# Patient Record
Sex: Male | Born: 1990 | Hispanic: No | Marital: Single | Smoking: Former smoker
Health system: Southern US, Community
[De-identification: ages and names within clinical notes are randomized; demographics above are authoritative.]

## PROBLEM LIST (undated history)

## (undated) DIAGNOSIS — I10 Essential (primary) hypertension: Secondary | ICD-10-CM

## (undated) DIAGNOSIS — I2510 Atherosclerotic heart disease of native coronary artery without angina pectoris: Secondary | ICD-10-CM

## (undated) DIAGNOSIS — I219 Acute myocardial infarction, unspecified: Secondary | ICD-10-CM

## (undated) DIAGNOSIS — J45909 Unspecified asthma, uncomplicated: Secondary | ICD-10-CM

## (undated) DIAGNOSIS — R748 Abnormal levels of other serum enzymes: Secondary | ICD-10-CM

## (undated) DIAGNOSIS — F988 Other specified behavioral and emotional disorders with onset usually occurring in childhood and adolescence: Secondary | ICD-10-CM

## (undated) DIAGNOSIS — R739 Hyperglycemia, unspecified: Secondary | ICD-10-CM

## (undated) DIAGNOSIS — K8020 Calculus of gallbladder without cholecystitis without obstruction: Secondary | ICD-10-CM

## (undated) DIAGNOSIS — Z973 Presence of spectacles and contact lenses: Secondary | ICD-10-CM

## (undated) DIAGNOSIS — F419 Anxiety disorder, unspecified: Secondary | ICD-10-CM

## (undated) DIAGNOSIS — K219 Gastro-esophageal reflux disease without esophagitis: Secondary | ICD-10-CM

## (undated) DIAGNOSIS — I255 Ischemic cardiomyopathy: Secondary | ICD-10-CM

## (undated) DIAGNOSIS — M109 Gout, unspecified: Secondary | ICD-10-CM

## (undated) HISTORY — PX: APPENDECTOMY: SHX54

---
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
## 2014-04-28 ENCOUNTER — Emergency Department (HOSPITAL_COMMUNITY): Payer: Self-pay

## 2014-04-28 ENCOUNTER — Encounter (HOSPITAL_COMMUNITY): Payer: Self-pay | Admitting: Emergency Medicine

## 2014-04-28 ENCOUNTER — Encounter (HOSPITAL_COMMUNITY)
Admission: EM | Disposition: A | Discharge status: Home / Self Care | Payer: Self-pay | Source: Home / Self Care | Attending: Cardiovascular Disease

## 2014-04-28 ENCOUNTER — Inpatient Hospital Stay (HOSPITAL_COMMUNITY)
Admission: EM | Admit: 2014-04-28 | Discharge: 2014-05-01 | DRG: 247 | Disposition: A | Discharge status: Home / Self Care | Payer: Self-pay | Attending: Cardiovascular Disease | Admitting: Cardiovascular Disease

## 2014-04-28 DIAGNOSIS — I2510 Atherosclerotic heart disease of native coronary artery without angina pectoris: Secondary | ICD-10-CM | POA: Diagnosis present

## 2014-04-28 DIAGNOSIS — I2102 ST elevation (STEMI) myocardial infarction involving left anterior descending coronary artery: Secondary | ICD-10-CM

## 2014-04-28 DIAGNOSIS — F909 Attention-deficit hyperactivity disorder, unspecified type: Secondary | ICD-10-CM | POA: Diagnosis present

## 2014-04-28 DIAGNOSIS — Z8249 Family history of ischemic heart disease and other diseases of the circulatory system: Secondary | ICD-10-CM

## 2014-04-28 DIAGNOSIS — I2109 ST elevation (STEMI) myocardial infarction involving other coronary artery of anterior wall: Principal | ICD-10-CM

## 2014-04-28 DIAGNOSIS — Z79899 Other long term (current) drug therapy: Secondary | ICD-10-CM

## 2014-04-28 DIAGNOSIS — Z9861 Coronary angioplasty status: Secondary | ICD-10-CM

## 2014-04-28 DIAGNOSIS — I255 Ischemic cardiomyopathy: Secondary | ICD-10-CM | POA: Diagnosis present

## 2014-04-28 DIAGNOSIS — F1290 Cannabis use, unspecified, uncomplicated: Secondary | ICD-10-CM | POA: Diagnosis present

## 2014-04-28 DIAGNOSIS — R748 Abnormal levels of other serum enzymes: Secondary | ICD-10-CM | POA: Diagnosis present

## 2014-04-28 DIAGNOSIS — R739 Hyperglycemia, unspecified: Secondary | ICD-10-CM | POA: Diagnosis present

## 2014-04-28 DIAGNOSIS — I213 ST elevation (STEMI) myocardial infarction of unspecified site: Secondary | ICD-10-CM

## 2014-04-28 HISTORY — DX: Hyperglycemia, unspecified: R73.9

## 2014-04-28 HISTORY — DX: Other specified behavioral and emotional disorders with onset usually occurring in childhood and adolescence: F98.8

## 2014-04-28 HISTORY — PX: LEFT HEART CATH: SHX5478

## 2014-04-28 HISTORY — DX: Ischemic cardiomyopathy: I25.5

## 2014-04-28 HISTORY — DX: Abnormal levels of other serum enzymes: R74.8

## 2014-04-28 HISTORY — DX: Atherosclerotic heart disease of native coronary artery without angina pectoris: I25.10

## 2014-04-28 LAB — CBC
HCT: 47.8 % (ref 39.0–52.0)
Hemoglobin: 17 g/dL (ref 13.0–17.0)
MCH: 29.8 pg (ref 26.0–34.0)
MCHC: 35.6 g/dL (ref 30.0–36.0)
MCV: 83.9 fL (ref 78.0–100.0)
Platelets: 336 10*3/uL (ref 150–400)
RBC: 5.7 MIL/uL (ref 4.22–5.81)
RDW: 12.7 % (ref 11.5–15.5)
WBC: 15.4 10*3/uL — ABNORMAL HIGH (ref 4.0–10.5)

## 2014-04-28 LAB — PROTIME-INR
INR: 0.97 (ref 0.00–1.49)
Prothrombin Time: 13 seconds (ref 11.6–15.2)

## 2014-04-28 LAB — I-STAT TROPONIN, ED: TROPONIN I, POC: 1.06 ng/mL — AB (ref 0.00–0.08)

## 2014-04-28 LAB — COMPREHENSIVE METABOLIC PANEL
ALT: 36 U/L (ref 0–53)
ANION GAP: 13 (ref 5–15)
AST: 44 U/L — AB (ref 0–37)
Albumin: 4.3 g/dL (ref 3.5–5.2)
Alkaline Phosphatase: 62 U/L (ref 39–117)
BUN: 8 mg/dL (ref 6–23)
CO2: 23 mmol/L (ref 19–32)
CREATININE: 1.07 mg/dL (ref 0.50–1.35)
Calcium: 9.6 mg/dL (ref 8.4–10.5)
Chloride: 100 mmol/L (ref 96–112)
GFR calc non Af Amer: >90 mL/min (ref >90)
GLUCOSE: 141 mg/dL — AB (ref 70–99)
Potassium: 3.8 mmol/L (ref 3.5–5.1)
SODIUM: 136 mmol/L (ref 135–145)
TOTAL PROTEIN: 7.5 g/dL (ref 6.0–8.3)
Total Bilirubin: 1 mg/dL (ref 0.3–1.2)

## 2014-04-28 LAB — TSH: TSH: 0.425 u[IU]/mL (ref 0.350–4.500)

## 2014-04-28 LAB — MRSA PCR SCREENING: MRSA by PCR: NEGATIVE

## 2014-04-28 LAB — TROPONIN I
Troponin I: 29.02 ng/mL (ref <0.031)
Troponin I: >80 ng/mL (ref <0.031)

## 2014-04-28 LAB — APTT: APTT: 26 s (ref 24–37)

## 2014-04-28 LAB — POCT ACTIVATED CLOTTING TIME: ACTIVATED CLOTTING TIME: 442 s

## 2014-04-28 SURGERY — LEFT HEART CATH
Anesthesia: LOCAL

## 2014-04-28 MED ORDER — MORPHINE SULFATE 4 MG/ML IJ SOLN
INTRAMUSCULAR | Status: AC
  Filled 2014-04-28: qty 1

## 2014-04-28 MED ORDER — LIDOCAINE HCL (PF) 1 % IJ SOLN
INTRAMUSCULAR | Status: AC
  Filled 2014-04-28: qty 30

## 2014-04-28 MED ORDER — SODIUM CHLORIDE 0.9 % IV SOLN
1.0000 mg/kg/h | INTRAVENOUS | Status: AC
  Filled 2014-04-28 (×2): qty 250

## 2014-04-28 MED ORDER — ONDANSETRON HCL 4 MG/2ML IJ SOLN
4.0000 mg | Freq: Four times a day (QID) | INTRAMUSCULAR | Status: DC | PRN
  Administered 2014-04-28: 4 mg via INTRAVENOUS
  Filled 2014-04-28: qty 2

## 2014-04-28 MED ORDER — SODIUM CHLORIDE 0.9 % IV SOLN
INTRAVENOUS | Status: DC
  Administered 2014-04-28: 08:00:00 via INTRAVENOUS

## 2014-04-28 MED ORDER — ATORVASTATIN CALCIUM 40 MG PO TABS: 40.0000 mg | ORAL_TABLET | Freq: Every day | ORAL | Status: DC

## 2014-04-28 MED ORDER — VERAPAMIL HCL 2.5 MG/ML IV SOLN
INTRAVENOUS | Status: AC
  Filled 2014-04-28: qty 2

## 2014-04-28 MED ORDER — FENTANYL CITRATE (PF) 100 MCG/2ML IJ SOLN
INTRAMUSCULAR | Status: AC
  Filled 2014-04-28: qty 2

## 2014-04-28 MED ORDER — TICAGRELOR 90 MG PO TABS
90.0000 mg | ORAL_TABLET | Freq: Two times a day (BID) | ORAL | Status: DC
  Administered 2014-04-28 – 2014-05-01 (×6): 90 mg via ORAL
  Filled 2014-04-28 (×7): qty 1

## 2014-04-28 MED ORDER — HYDRALAZINE HCL 20 MG/ML IJ SOLN
10.0000 mg | Freq: Four times a day (QID) | INTRAMUSCULAR | Status: DC | PRN
  Administered 2014-04-28: 10 mg via INTRAVENOUS
  Filled 2014-04-28: qty 1

## 2014-04-28 MED ORDER — MORPHINE SULFATE 4 MG/ML IJ SOLN
4.0000 mg | Freq: Once | INTRAMUSCULAR | Status: AC
  Administered 2014-04-28: 4 mg via INTRAVENOUS

## 2014-04-28 MED ORDER — BIVALIRUDIN 250 MG IV SOLR
INTRAVENOUS | Status: AC
  Filled 2014-04-28: qty 250

## 2014-04-28 MED ORDER — ASPIRIN 81 MG PO CHEW
324.0000 mg | CHEWABLE_TABLET | Freq: Once | ORAL | Status: AC
  Administered 2014-04-28: 324 mg via ORAL
  Filled 2014-04-28: qty 4

## 2014-04-28 MED ORDER — TICAGRELOR 90 MG PO TABS
ORAL_TABLET | ORAL | Status: AC
Start: 2014-04-28 — End: 2014-04-28
  Filled 2014-04-28: qty 2

## 2014-04-28 MED ORDER — TIROFIBAN HCL IV 5 MG/100ML
INTRAVENOUS | Status: AC
  Filled 2014-04-28: qty 100

## 2014-04-28 MED ORDER — ACETAMINOPHEN 325 MG PO TABS: 650.0000 mg | ORAL_TABLET | ORAL | Status: DC | PRN

## 2014-04-28 MED ORDER — ATORVASTATIN CALCIUM 80 MG PO TABS
80.0000 mg | ORAL_TABLET | Freq: Every day | ORAL | Status: DC
  Administered 2014-04-28 – 2014-04-30 (×3): 80 mg via ORAL
  Filled 2014-04-28 (×4): qty 1

## 2014-04-28 MED ORDER — NITROGLYCERIN 0.4 MG SL SUBL
0.4000 mg | SUBLINGUAL_TABLET | SUBLINGUAL | Status: DC | PRN
  Administered 2014-04-28: 0.4 mg via SUBLINGUAL
  Filled 2014-04-28: qty 1

## 2014-04-28 MED ORDER — ASPIRIN EC 81 MG PO TBEC
81.0000 mg | DELAYED_RELEASE_TABLET | Freq: Every day | ORAL | Status: DC
  Administered 2014-04-29 – 2014-05-01 (×3): 81 mg via ORAL
  Filled 2014-04-28 (×3): qty 1

## 2014-04-28 MED ORDER — MORPHINE SULFATE 2 MG/ML IJ SOLN
2.0000 mg | INTRAMUSCULAR | Status: DC | PRN
  Administered 2014-04-28: 2 mg via INTRAVENOUS
  Filled 2014-04-28: qty 1

## 2014-04-28 MED ORDER — NITROGLYCERIN IN D5W 200-5 MCG/ML-% IV SOLN
0.0000 ug/min | Freq: Once | INTRAVENOUS | Status: AC
  Administered 2014-04-28: 5 ug/min via INTRAVENOUS
  Filled 2014-04-28: qty 250

## 2014-04-28 MED ORDER — NITROGLYCERIN 1 MG/10 ML FOR IR/CATH LAB
INTRA_ARTERIAL | Status: AC
  Filled 2014-04-28: qty 10

## 2014-04-28 MED ORDER — NITROGLYCERIN 0.4 MG SL SUBL: 0.4000 mg | SUBLINGUAL_TABLET | SUBLINGUAL | Status: DC | PRN

## 2014-04-28 MED ORDER — SODIUM CHLORIDE 0.9 % IV SOLN
INTRAVENOUS | Status: DC
  Administered 2014-04-28: 75 mL/h via INTRAVENOUS

## 2014-04-28 MED ORDER — HEPARIN (PORCINE) IN NACL 2-0.9 UNIT/ML-% IJ SOLN
INTRAMUSCULAR | Status: AC
  Filled 2014-04-28: qty 1000

## 2014-04-28 MED ORDER — HEPARIN SODIUM (PORCINE) 5000 UNIT/ML IJ SOLN
4000.0000 [IU] | INTRAMUSCULAR | Status: AC
  Administered 2014-04-28: 4000 [IU] via INTRAVENOUS
  Filled 2014-04-28: qty 1

## 2014-04-28 MED ORDER — TIROFIBAN HCL IV 12.5 MG/250 ML
INTRAVENOUS | Status: AC
  Filled 2014-04-28: qty 250

## 2014-04-28 MED ORDER — METOPROLOL TARTRATE 12.5 MG HALF TABLET
12.5000 mg | ORAL_TABLET | Freq: Two times a day (BID) | ORAL | Status: DC
  Administered 2014-04-28 – 2014-04-29 (×3): 12.5 mg via ORAL
  Filled 2014-04-28 (×4): qty 1

## 2014-04-28 MED ORDER — HEPARIN SODIUM (PORCINE) 1000 UNIT/ML IJ SOLN
INTRAMUSCULAR | Status: AC
  Filled 2014-04-28: qty 1

## 2014-04-28 MED ORDER — ONDANSETRON HCL 4 MG/2ML IJ SOLN: 4.0000 mg | Freq: Four times a day (QID) | INTRAMUSCULAR | Status: DC | PRN

## 2014-04-28 MED ORDER — ASPIRIN 81 MG PO CHEW: 81.0000 mg | CHEWABLE_TABLET | Freq: Every day | ORAL | Status: DC

## 2014-04-28 NOTE — ED Notes (Signed)
Pt c/o central chest pain that started three hours ago. Pt sts he smoked pott last night.

## 2014-04-28 NOTE — H&P (Signed)
HPI:  24 y/o male with h/o ADD but no other significant PMHx.  3 hours prior to arrival developed severe substernal CP with nausea and SOB. Pain persisted. Came to ER and ECG showed marked anterolateral ST elevation. Code STEMI activated.  Has been using marijuana and some ETOH but denies cocaine or other substances. Non-smoker. No h/o bleeding or recent surgery.   Review of Systems:     Cardiac Review of Systems: {Y] = yes [ ]  = no  Chest Pain [   y ]  Resting SOB [  y ] Exertional SOB  [  ]  Orthopnea [  ]   Pedal Edema [   ]    Palpitations [  ] Syncope  [  ]   Presyncope [   ]  General Review of Systems: [Y] = yes [  ]=no Constitional: recent weight change [  ]; anorexia [  ]; fatigue [  ]; nausea [  ]; night sweats [  ]; fever [  ]; or chills [  ];                                                                     Dental: poor dentition[  ];   Eye : blurred vision [  ]; diplopia [   ]; vision changes [  ];  Amaurosis fugax[  ]; Resp: cough [  ];  wheezing[  ];  hemoptysis[  ]; shortness of breath[  ]; paroxysmal nocturnal dyspnea[  ]; dyspnea on exertion[  ]; or orthopnea[  ];  GI:  gallstones[  ], vomiting[  ];  dysphagia[  ]; melena[  ];  hematochezia [  ]; heartburn[  ];   GU: kidney stones [  ]; hematuria[  ];   dysuria [  ];  nocturia[  ];               Skin: rash [  ], swelling[  ];, hair loss[  ];  peripheral edema[  ];  or itching[  ]; Musculosketetal: myalgias[  ];  joint swelling[  ];  joint erythema[  ];  joint pain[  ];  back pain[  ];  Heme/Lymph: bruising[  ];  bleeding[  ];  anemia[  ];  Neuro: TIA[  ];  headaches[  ];  stroke[  ];  vertigo[  ];  seizures[  ];   paresthesias[  ];  difficulty walking[  ];  Psych:depression[  ]; anxiety[  ];  Endocrine: diabetes[  ];  thyroid dysfunction[  ];  Other:  Past Medical History  Diagnosis Date  . ADD (attention deficit disorder)     Prior to Admission medications   Medication Sig Start Date End Date Taking?  Authorizing Provider  amphetamine-dextroamphetamine (ADDERALL XR) 10 MG 24 hr capsule Take 10 mg by mouth daily.   Yes Historical Provider, MD      No Known Allergies  History   Social History  . Marital Status: Single    Spouse Name: N/A  . Number of Children: N/A  . Years of Education: N/A   Occupational History  . Not on file.   Social History Main Topics  . Smoking status: Never Smoker   . Smokeless tobacco: Not on file  .  Alcohol Use: Not on file  . Drug Use: 2.00 per week    Special: Marijuana  . Sexual Activity: Not on file   Other Topics Concern  . Not on file   Social History Narrative  . No narrative on file    History reviewed. No pertinent family history.  Father with HTN Mother ok Brother ok  PHYSICAL EXAM: Filed Vitals:   04/28/14 0800  BP: 99/77  Pulse: 69  Temp:   Resp:    General:  Uncomfortable No respiratory difficulty HEENT: normal Neck: supple. no JVD. Carotids 2+ bilat; no bruits. No lymphadenopathy or thryomegaly appreciated. Cor: PMI nondisplaced. Regular rate & rhythm. No rubs, gallops or murmurs. Lungs: clear Abdomen: soft, nontender, nondistended. No hepatosplenomegaly. No bruits or masses. Good bowel sounds. Extremities: no cyanosis, clubbing, rash, edema Neuro: alert & oriented x 3, cranial nerves grossly intact. moves all 4 extremities w/o difficulty. Affect pleasant.  ECG: NSR with anterolateral ST elevation  Results for orders placed or performed during the hospital encounter of 04/28/14 (from the past 24 hour(s))  I-Stat Troponin, ED (not at [facility])     Status: Abnormal   Collection Time: 04/28/14  8:00 AM  Result Value Ref Range   Troponin i, poc 1.06 (HH) 0.00 - 0.08 ng/mL   Comment NOTIFIED PHYSICIAN    Comment 3           Dg Chest Port 1 View  04/28/2014   CLINICAL DATA:  c/o central chest pain that started three hours ago. Pt sts he smoked pott last night. CODE STEMI  EXAM: PORTABLE CHEST - 1 VIEW  COMPARISON:   None.  FINDINGS: The heart size and mediastinal contours are within normal limits. Both lungs are clear. The visualized skeletal structures are unremarkable.  IMPRESSION: No active disease.   Electronically Signed   By: Amie Portland M.D.   On: 04/28/2014 07:56     ASSESSMENT: 1. Acute anterolateral MI 2. ADD on adderall  PLAN/DISCUSSION:  Has been treated with ASA, heparin, NTG and MSO4. Plan for emergent cath with Dr. Allyson Sabal. Check lipid panel in am. Would avoid adderall at least in near future.   Breyson Kelm,MD 8:21 AM

## 2014-04-28 NOTE — ED Notes (Signed)
EKG completed and immediately shown to Dr. Blinda Leatherwood

## 2014-04-28 NOTE — Progress Notes (Signed)
Right art sheath pulled, pressure held x 20 min. Hemostasis achieved, positive distal pulses. Pt tolerated well

## 2014-04-28 NOTE — CV Procedure (Signed)
Raydin Bielinski is a 24 y.o. male    [id number] LOCATION:  FACILITY: [facility]  PHYSICIAN: Quay Burow, M.D. 1990-06-18   DATE OF PROCEDURE:  04/28/2014  DATE OF DISCHARGE:     CARDIAC CATHETERIZATION / PCI    History obtained from chart review.Mr. Karwowski is a 24 year old [place] male without prior cardiac history with no critical factors who developed substernal chest pain 3 hours prior to presentation to [facility] emergency room where he was noted to have anterolateral ST segment elevation with reciprocal inferior depression. He was brought emergently to the cardiac catheterization lab for angiography and intervention. He denied illicit drug abuse.   PROCEDURE DESCRIPTION:   The patient was brought to the second floor [facility] [facility] cath lab in the postabsorptive state. He was premedicated with fentanyl 25 mg IV. His right wrist and groin Were prepped and shaved in usual sterile fashion. Xylocaine 1% was used for local anesthesia. A 5/6 French sheath was inserted into the right radial artery using standard Seldinger technique. The patient received  4000 units  of heparin intravenously.  A 5 [place] TIG catheter was used for angiography the right coronary artery. I could not engage the left main because the root was short and therefore had to switch to the right common femoral artery. The patient received radial cocktail via the SideArm sheath. Visipaque dye was used for the entirety of the case. Retrograde aortic, left ventricular and pullback pressures were recorded.    HEMODYNAMICS:    AO SYSTOLIC/AO DIASTOLIC: 248/18   LV SYSTOLIC/LV DIASTOLIC: 590/93  ANGIOGRAPHIC RESULTS:   1. Left main; normal  2. LAD; subtotally occluded proximally with a large thrombus burden and TIMI 1 flow 3. Left circumflex; nondominant and normal.  4. Right coronary artery; dominant and normal with grade 1 right to left collaterals to the LAD 5. Left ventriculography; RAO left  ventriculogram was performed using  25 mL of Visipaque dye at 12 mL/second. The overall LVEF estimated  25-30 %  With wall motion abnormalities notable for high anterolateral and anteroapical akinesia.  IMPRESSION:acute anterior wall myocardial infarction secondary to ruptured plaque proximal LAD with large thrombus burden. We will proceed with aspiration thrombectomy, IV Angiomax and Aggrastat, PCI and stenting using drug-eluting stent and dual antiplatelet therapy.  Procedure description: Using an XB LAD 3 cm curved guiding catheter along with an 014/190 cm long pro-water guidewire and a Pronto V4 aspiration catheter aspiration thrombectomy was performed. After multiple passes brisk antegrade flow was reestablished and a large white thrombus burden was withdrawn. A 75-80% smooth plaque was the residual obstruction after the thrombus was aspirated. There was TIMI-3 flow. Following this direct stenting was performed with a 3 mm x 18 mm long Xience drug-eluting stent at 16 atm post-dilating with a 3.25 mm x 15 mm long noncompliant balloon at 16 atm (3.3 mm) resulting reduction of a subtotal thrombotic occlusion to 0% residual with TIMI-3 flow and no dissection. The patient's chest pain resolved at the end of the case. He did have a large anterior wall motion abnormality with an EF of 25-30%. The door to balloon time was 23 minutes.  Overall impression: Anterior wall marker infarction in a young [place] male without risk factors due to ruptured proximal LAD plaque with a large thrombus burden. With aspiration thrombectomy, IV Angiomax and Aggrastat, PCI and stenting using drug-eluting stent and therapy including Brilenta. The door to balloon time was 23 minutes. The patient had chest pain 3 hours prior to presentation. I suspect  he will have significant improvement in LV function. He'll be treated with a typical medications including beta blocker, high-dose ACE inhibitor, statin drug and dual antiplatelet   therapy. He left the lab in stable condition.  BERRY,JONATHAN J. MD, FACC 04/28/2014 9:29 AM     

## 2014-04-28 NOTE — ED Provider Notes (Signed)
Patient presented to the ER with crushing chest pain that began 3 hours before arrival. Patient reports that it awakened him from sleep.  Face to face Exam: HEENT - PERRLA Lungs - CTAB Heart - RRR, no M/R/G Abd - S/NT/ND Neuro - alert, oriented x3  Plan: EKG consistent with acute STEMI. Code STEMI activated, discussed with Dr. Allyson Sabal and Dr. Gala Romney, patient to be taken to the Cath Lab.  Gilda Crease, MD 04/28/14 [phone]

## 2014-04-28 NOTE — ED Provider Notes (Addendum)
CSN: [id number]     Arrival date & time 04/28/14  5427 History   First MD Initiated Contact with Patient 04/28/14 0732     Chief Complaint  Patient presents with  . Chest Pain     (Consider location/radiation/quality/duration/timing/severity/associated sxs/prior Treatment) HPI   PCP: No primary care provider on file. Blood pressure 152/101, pulse 70, temperature 98.3 F (36.8 C), temperature source Oral, resp. rate 18, height 5\' 8"  (1.727 m), weight 178 lb (80.74 kg).  Chad Fitzgerald is a 24 y.o.male with a significant PMH of appendectomy presents to the ER with complaints of central chest pain that started acutely 3 hours ago. He takes prescription Adderall, drinks on the weekend and smokes mariajuana occasionally. He denies a hx of having chest pains ever before in the past. Reports a family history of hypertension but no early MI in the family. He has associated nausea. The pain radiates down his left arm intermittently. He describes the pains as sharp. Nothing makes the pain better or worse. Denies vomiting, nausea or diarrhea. Denies lower extremity swelling. He has not had any syncope, trauma. Pt denies recreational drugs. EKG shows ST Elevation, Dr. Blinda Leatherwood has seen patient and a Code STEMI paged.   History reviewed. No pertinent past medical history. Past Surgical History  Procedure Laterality Date  . Appendectomy     History reviewed. No pertinent family history. History  Substance Use Topics  . Smoking status: Never Smoker   . Smokeless tobacco: Not on file  . Alcohol Use: Not on file    Review of Systems 10 Systems reviewed and are negative for acute change except as noted in the HPI.   Allergies  Review of patient's allergies indicates no known allergies.  Home Medications   Prior to Admission medications   Medication Sig Start Date End Date Taking? Authorizing Provider  amphetamine-dextroamphetamine (ADDERALL XR) 10 MG 24 hr capsule Take 10 mg by mouth  daily.   Yes Historical Provider, MD   BP 152/101 mmHg  Pulse 70  Temp(Src) 98.3 F (36.8 C) (Oral)  Resp 18  Ht 5\' 8"  (1.727 m)  Wt 178 lb (80.74 kg)  BMI 27.07 kg/m2 Physical Exam  Constitutional: He appears well-developed and well-nourished. He appears distressed.  Pt is pale  HENT:  Head: Normocephalic and atraumatic.  Eyes: Pupils are equal, round, and reactive to light.  Neck: Normal range of motion. Neck supple.  Cardiovascular: Normal rate and regular rhythm.   Pulmonary/Chest: Effort normal and breath sounds normal. No accessory muscle usage. No respiratory distress. He has no decreased breath sounds. He has no wheezes.  Abdominal: Soft. Bowel sounds are normal. There is no tenderness.  Musculoskeletal:  No lower extremity swelling  Neurological: He is alert.  Skin: Skin is warm. No rash noted. He is diaphoretic.  Nursing note and vitals reviewed.   ED Course  Procedures (including critical care time) Labs Review Labs Reviewed  APTT  CBC  COMPREHENSIVE METABOLIC PANEL  PROTIME-INR  I-STAT TROPOININ, ED    Imaging Review No results found.   EKG Interpretation None      MDM   Final diagnoses:  ST elevation myocardial infarction (STEMI), unspecified artery    EKG shows STEMI. Patients pain has persisted for 3 hours. Dr. Blinda Leatherwood called cardiologist and activated Code STEMI. Dr. Chauncey Cruel here is see patient, agrees EKG shows STEMI and plans to take him to the cath lab, ASAP.    Marlon Pel, PA-C 04/28/14 0623  Gilda Crease, MD 04/28/14  1610  Marlon Pel, PA-C 04/28/14 0825  Gilda Crease, MD 04/28/14 [phone]

## 2014-04-28 NOTE — ED Notes (Signed)
PAGED CODE STEMI PER DR.POLLINA @ 7:40AM

## 2014-04-28 NOTE — Progress Notes (Signed)
MD expected Troponin to elevate post MI

## 2014-04-28 NOTE — Progress Notes (Signed)
Followed up on Code STEMI that came in before shift change of chaplains.  Per Dr. Allyson Sab[facility], informed family that procedure is complete, doctor opened up an artery and patient is doing well. Brother had been very anxious about patient's condition and expressed relief to hear news.  Please call if follow-up care is needed for patient or family.   Chad Fitzgerald [phone]    04/28/14 0900  Clinical Encounter Type  Visited With Family  Visit Type Initial  Referral From (Code STEMI)

## 2014-04-29 DIAGNOSIS — I255 Ischemic cardiomyopathy: Secondary | ICD-10-CM

## 2014-04-29 LAB — CBC
HCT: 46.1 % (ref 39.0–52.0)
Hemoglobin: 16 g/dL (ref 13.0–17.0)
MCH: 30 pg (ref 26.0–34.0)
MCHC: 34.7 g/dL (ref 30.0–36.0)
MCV: 86.5 fL (ref 78.0–100.0)
Platelets: 303 10*3/uL (ref 150–400)
RBC: 5.33 MIL/uL (ref 4.22–5.81)
RDW: 13.4 % (ref 11.5–15.5)
WBC: 12.9 10*3/uL — AB (ref 4.0–10.5)

## 2014-04-29 LAB — BASIC METABOLIC PANEL
Anion gap: 9 (ref 5–15)
BUN: 9 mg/dL (ref 6–23)
CALCIUM: 8.8 mg/dL (ref 8.4–10.5)
CO2: 22 mmol/L (ref 19–32)
CREATININE: 0.86 mg/dL (ref 0.50–1.35)
Chloride: 106 mmol/L (ref 96–112)
GFR calc Af Amer: >90 mL/min (ref >90)
GLUCOSE: 115 mg/dL — AB (ref 70–99)
POTASSIUM: 4 mmol/L (ref 3.5–5.1)
Sodium: 137 mmol/L (ref 135–145)

## 2014-04-29 LAB — LIPID PANEL
CHOL/HDL RATIO: 3.3 ratio
CHOLESTEROL: 110 mg/dL (ref 0–200)
HDL: 33 mg/dL — ABNORMAL LOW (ref >39)
LDL Cholesterol: 58 mg/dL (ref 0–99)
Triglycerides: 95 mg/dL (ref <150)
VLDL: 19 mg/dL (ref 0–40)

## 2014-04-29 MED ORDER — LISINOPRIL 5 MG PO TABS
5.0000 mg | ORAL_TABLET | Freq: Two times a day (BID) | ORAL | Status: DC
  Administered 2014-04-29 – 2014-05-01 (×5): 5 mg via ORAL
  Filled 2014-04-29 (×6): qty 1

## 2014-04-29 MED ORDER — CARVEDILOL 3.125 MG PO TABS
3.1250 mg | ORAL_TABLET | Freq: Two times a day (BID) | ORAL | Status: DC
Start: 2014-04-29 — End: 2014-05-01
  Administered 2014-04-29 – 2014-05-01 (×4): 3.125 mg via ORAL
  Filled 2014-04-29 (×6): qty 1

## 2014-04-29 MED ORDER — SPIRONOLACTONE 25 MG PO TABS
12.5000 mg | ORAL_TABLET | Freq: Every day | ORAL | Status: DC
  Administered 2014-04-29 – 2014-05-01 (×3): 12.5 mg via ORAL
  Filled 2014-04-29 (×3): qty 1

## 2014-04-29 NOTE — Progress Notes (Signed)
Subjective:   24 y/o male with h/o ADD admitted 4/23 with acute anterolateral MI.   LAD totally occluded on cath. Now s/p DES to LAD. Coronaries otherwise normal. EF 25-30%. Troponin > 80  Feels better this am. No CP or SOB   Intake/Output Summary (Last 24 hours) at 04/29/14 1027 Last data filed at 04/29/14 1000  Gross per 24 hour  Intake 1390.2 ml  Output   1200 ml  Net  190.2 ml    Current meds: . aspirin EC  81 mg Oral Daily  . atorvastatin  80 mg Oral q1800  . metoprolol tartrate  12.5 mg Oral BID  . ticagrelor  90 mg Oral BID   Infusions: . sodium chloride 20 mL/hr at 04/28/14 0753  . sodium chloride 75 mL/hr at 04/28/14 2000     Objective:  Blood pressure 115/70, pulse 99, temperature 98.2 F (36.8 C), temperature source Oral, resp. rate 21, height  (1.727 m), weight 80.3 kg (177 lb 0.5 oz), SpO2 96 %. Weight change:   Physical Exam: General: Lying in bed   No respiratory difficulty HEENT: normal Neck: supple. no JVD. Carotids 2+ bilat; no bruits. No lymphadenopathy or thryomegaly appreciated. Cor: PMI nondisplaced. Tachy regular No rubs, gallops or murmurs. Lungs: clear Abdomen: soft, nontender, nondistended. No hepatosplenomegaly. No bruits or masses. Good bowel sounds. Extremities: no cyanosis, clubbing, rash, edema R groin site ok  Neuro: alert & oriented x 3, cranial nerves grossly intact. moves all 4 extremities w/o difficulty. Affect pleasant.   Telemetry: Sinus/sinus tach 90-100s  Lab Results: Basic Metabolic Panel:  Recent Labs Lab 04/28/14 0742 04/29/14 0339  NA 136 137  K 3.8 4.0  CL 100 106  CO2 23 22  GLUCOSE 141* 115*  BUN 8 9  CREATININE 1.07 0.86  CALCIUM 9.6 8.8   Liver Function Tests:  Recent Labs Lab 04/28/14 0742  AST 44*  ALT 36  ALKPHOS 62  BILITOT 1.0  PROT 7.5  ALBUMIN 4.3   No results for input(s): LIPASE, AMYLASE in the last 168 hours. No results for input(s): AMMONIA in the last 168  hours. CBC:  Recent Labs Lab 04/28/14 0742 04/29/14 0339  WBC 15.4* 12.9*  HGB 17.0 16.0  HCT 47.8 46.1  MCV 83.9 86.5  PLT 336 303   Cardiac Enzymes:  Recent Labs Lab 04/28/14 1040 04/28/14 1635 04/28/14 2209  TROPONINI 29.02* >80.00* >80.00*   BNP: Invalid input(s): POCBNP CBG: No results for input(s): GLUCAP in the last 168 hours. Microbiology: No results found for: CULT No results for input(s): CULT, SDES in the last 168 hours.  Imaging: Dg Chest Port 1 View  04/28/2014   CLINICAL DATA:  c/o central chest pain that started three hours ago. Pt sts he smoked pott last night. CODE STEMI  EXAM: PORTABLE CHEST - 1 VIEW  COMPARISON:  None.  FINDINGS: The heart size and mediastinal contours are within normal limits. Both lungs are clear. The visualized skeletal structures are unremarkable.  IMPRESSION: No active disease.   Electronically Signed   By: Amie Portland M.D.   On: 04/28/2014 07:56     ASSESSMENT:  1. Acute anterolateral MI      --s/p DES to totally occluded LAD 4/23 2. Ischemic CM - EF 25-30% 3. ADD on adderall 4. Low HDL  5. Hyperglycemia  PLAN/DISCUSSION:  He has has a big anterior MI with a bit of a delayed presentation (3 hours prior to coming to ER). Suspect he may wind  up with residual LV dysfunction. No overt HF today but is tachycardic. Will start ACE and low-dose carvedilol. Also spiro 12.5. Continue statin and Brilinta. Discussed need for behavioral modification. Stop Adderall. Cardiac rehab to see. Can go to tele. Possibly home in am. Will get echo in am. Check HgBA1c.    LOS: 1 day    Arvilla Meres, MD 04/29/2014, 10:27 AM

## 2014-04-29 NOTE — Progress Notes (Signed)
EKG CRITICAL VALUE     12 lead EKG performed.  Critical value noted.Delice Bison  , RN notified.   Wandalee Ferdinand, [place] 04/29/2014 7:29 AM

## 2014-04-29 NOTE — Progress Notes (Signed)
Utilization review completed.  

## 2014-04-30 DIAGNOSIS — I213 ST elevation (STEMI) myocardial infarction of unspecified site: Secondary | ICD-10-CM

## 2014-04-30 LAB — HEMOGLOBIN A1C
Hgb A1c MFr Bld: 5.2 % (ref 4.8–5.6)
MEAN PLASMA GLUCOSE: 103 mg/dL

## 2014-04-30 LAB — ANTITHROMBIN III: ANTITHROMB III FUNC: 100 % (ref 75–120)

## 2014-04-30 MED ORDER — PERFLUTREN LIPID MICROSPHERE
1.0000 mL | INTRAVENOUS | Status: AC | PRN
  Administered 2014-04-30: 4 mL via INTRAVENOUS
  Filled 2014-04-30: qty 10

## 2014-04-30 NOTE — Care Management Note (Unsigned)
    Page 1 of 1   04/30/2014     12:11:32 PM CARE MANAGEMENT NOTE 04/30/2014  Patient:  Chad Fitzgerald, Chad Fitzgerald   Account Number:  [id number]  Date Initiated:  04/30/2014  Documentation initiated by:  GRAVES-BIGELOW,Lourdez Mcgahan  Subjective/Objective Assessment:   Pt admitted for [facility]. Pt is without insurance. No PCP listed.     Action/Plan:   CM did provide pt with 30 day free Brilinta card. MD will need to fill out Brilinta form for possible year supply free. Pt will need Rx for 30 day supply no refills and the patient assistance form.   Anticipated DC Date:  05/01/2014   Anticipated DC Plan:  HOME/SELF CARE      DC Planning Services  CM consult  Follow-up appt scheduled  Medication Assistance  [facility]      Choice offered to / List presented to:  C-1 Patient   DME arranged  VEST - LIFE VEST           Status of service:  Completed, signed off Medicare Important Message given?  NO (If response is "NO", the following Medicare IM given date fields will be blank) Date Medicare IM given:   Medicare IM given by:   Date Additional Medicare IM given:   Additional Medicare IM given by:    Discharge Disposition:  HOME/SELF CARE  Per UR Regulation:  Reviewed for med. necessity/level of care/duration of stay  If discussed at Long Length of Stay Meetings, dates discussed:    Comments:  Pt uses [facility]: Brilinta Medication available. Possible need for Life vest. [employer] Liaison aware. If pt needs life vest it will be a lease through CSW. CM will continue to monitor.

## 2014-04-30 NOTE — Progress Notes (Signed)
CARDIAC REHAB PHASE I   PRE:  Rate/Rhythm: 85 SR    BP: sitting 101/62    SaO2:   MODE:  Ambulation: 550 ft   POST:  Rate/Rhythm: 116 ST at first, 110 ST toward end    BP: sitting 107/66     SaO2:   Tolerated fairly well. Sts he is SOB with activity/walking. Slow pace. See above for vitals. Began ed. Will continue to follow. [phone]   Elissa Lovett Thruston CES, ACSM 04/30/2014 11:55 AM

## 2014-04-30 NOTE — Progress Notes (Signed)
    Subjective:  Reports mild shortness of breath with walking. Otherwise no complaints. Specifically denies chest pain.  Objective:  Vital Signs in the last 24 hours: Temp:  [98 F (36.7 C)-99.7 F (37.6 C)] 99.1 F (37.3 C) (04/25 0500) Pulse Rate:  [94-113] 94 (04/25 0753) Resp:  [16-18] 16 (04/25 0500) BP: (104-124)/(41-74) 108/57 mmHg (04/25 0753) SpO2:  [96 %-98 %] 96 % (04/25 0500) Weight:  [173 lb 8 oz (78.699 kg)] 173 lb 8 oz (78.699 kg) (04/25 0500)  Intake/Output from previous day: 04/24 0701 - 04/25 0700 In: 1080 [P.O.:1080] Out: -   Physical Exam: Pt is alert and oriented, NAD HEENT: normal Neck: JVP - normal, carotids 2+= without bruits Lungs: CTA bilaterally CV: Tachycardic and regular without murmur or gallop Abd: soft, NT, Positive BS, no hepatomegaly Ext: no C/C/E, distal pulses intact and equal Skin: warm/dry no rash   Lab Results:  Recent Labs  04/28/14 0742 04/29/14 0339  WBC 15.4* 12.9*  HGB 17.0 16.0  PLT 336 303    Recent Labs  04/28/14 0742 04/29/14 0339  NA 136 137  K 3.8 4.0  CL 100 106  CO2 23 22  GLUCOSE 141* 115*  BUN 8 9  CREATININE 1.07 0.86    Recent Labs  04/28/14 1635 04/28/14 2209  TROPONINI >80.00* >80.00*    Cardiac Studies: 2-D echocardiogram pending  Tele: Sinus rhythm, sinus tachycardia  Assessment/Plan:  1. Acute anterolateral myocardial infarction, status post DES to the proximal LAD 2. Ischemic cardiomyopathy, LVEF less than 30%, consistent with anterior MI 3. Low HDL cholesterol 4. Hyperglycemia  Medications reviewed. The patient is on an excellent regimen with carvedilol, Aldactone, and lisinopril. His blood pressure is in the low-normal range and I think best to leave on current doses of medications. He will continue on dual antiplatelet therapy with aspirin and brilinta. Continue high-intensity statin drug. We had a lengthy discussion about risk factor modification and post MI restrictions. The  patient works and goes to school. He will likely need to be out of work for 3-4 weeks. He is going to think about whether he can finish his current semester in school. We will review further tomorrow. We also discussed that if his LVEF remains less than 35%, will consider a LifeVest at discharge. As he is 24 years old with no family history of premature CAD and no history of illicit drug use other than marijuana, I am going to send a hypercoagulable panel. Will also check a hemoglobin A1c in the setting of his hyperglycemia. Anticipate hospital discharge tomorrow pending 2-D echo result.  Tonny Bollman, M.D. 04/30/2014, 8:25 AM

## 2014-04-30 NOTE — Progress Notes (Signed)
  Echocardiogram 2D Echocardiogram has been performed.  Joedy Eickhoff 04/30/2014, 1:28 PM

## 2014-05-01 ENCOUNTER — Encounter (HOSPITAL_COMMUNITY): Payer: Self-pay | Admitting: Physician Assistant

## 2014-05-01 ENCOUNTER — Telehealth: Payer: Self-pay | Admitting: Cardiovascular Disease

## 2014-05-01 DIAGNOSIS — R739 Hyperglycemia, unspecified: Secondary | ICD-10-CM | POA: Diagnosis present

## 2014-05-01 DIAGNOSIS — I255 Ischemic cardiomyopathy: Secondary | ICD-10-CM | POA: Diagnosis present

## 2014-05-01 DIAGNOSIS — Z9861 Coronary angioplasty status: Secondary | ICD-10-CM

## 2014-05-01 DIAGNOSIS — R748 Abnormal levels of other serum enzymes: Secondary | ICD-10-CM | POA: Diagnosis present

## 2014-05-01 DIAGNOSIS — I2510 Atherosclerotic heart disease of native coronary artery without angina pectoris: Secondary | ICD-10-CM | POA: Diagnosis present

## 2014-05-01 LAB — HOMOCYSTEINE: HOMOCYSTEINE-NORM: 8.2 umol/L (ref 0.0–15.0)

## 2014-05-01 LAB — HEMOGLOBIN A1C
HEMOGLOBIN A1C: 5.3 % (ref 4.8–5.6)
Mean Plasma Glucose: 105 mg/dL

## 2014-05-01 MED ORDER — TICAGRELOR 90 MG PO TABS: 90.0000 mg | ORAL_TABLET | Freq: Two times a day (BID) | ORAL | Status: DC

## 2014-05-01 MED ORDER — CARVEDILOL 3.125 MG PO TABS: 3.1250 mg | ORAL_TABLET | Freq: Two times a day (BID) | ORAL | Status: DC

## 2014-05-01 MED ORDER — LISINOPRIL 5 MG PO TABS: 5.0000 mg | ORAL_TABLET | Freq: Two times a day (BID) | ORAL | Status: DC

## 2014-05-01 MED ORDER — ASPIRIN 81 MG PO TBEC: 81.0000 mg | DELAYED_RELEASE_TABLET | Freq: Every day | ORAL | Status: DC

## 2014-05-01 MED ORDER — NITROGLYCERIN 0.4 MG SL SUBL: 0.4000 mg | SUBLINGUAL_TABLET | SUBLINGUAL | Status: DC | PRN

## 2014-05-01 MED ORDER — SPIRONOLACTONE 25 MG PO TABS: 12.5000 mg | ORAL_TABLET | Freq: Every day | ORAL | Status: DC

## 2014-05-01 MED ORDER — ATORVASTATIN CALCIUM 80 MG PO TABS
80.0000 mg | ORAL_TABLET | Freq: Every day | ORAL | Status: DC
Start: 2014-05-01 — End: 2016-05-05

## 2014-05-01 NOTE — Discharge Instructions (Signed)

## 2014-05-01 NOTE — Discharge Summary (Signed)
Discharge Summary   Patient ID: Chad Fitzgerald MRN: [id number], DOB/AGE: [birth year] 24 y.o. Admit date: 04/28/2014 D/C date:     05/01/2014  Primary Cardiologist: New- Dr. Allyson Sabal   Principal Problem:   Acute MI, anterolateral wall, initial episode of care Active Problems:   STEMI (ST elevation myocardial infarction)   Low serum HDL   Hyperglycemia   Ischemic cardiomyopathy   CAD (coronary artery disease)    Admission Dates: 04/28/14-05/01/14 Discharge Diagnosis: acute anterolateral STEMI s/p DES to pLAD  HPI: Chad Fitzgerald is a 24 y.o. male with a history of ADD on adderall but no other PMHx who presented to [facility] on 04/28/14 with severe substernal CP and SOB and found to have acute anterolateral STEMI.   He is 24 years old with no family history of premature CAD and no history of illicit drug use other than marijuana.  Hospital Course  Acute anterolateral STEMI: s/p DES to the proximal LAD. Coronaries otherwise normal. EF 25-30%. -- Troponin peaked >80 --  He does have residual anterolateral ST segment elevation suggestive of extensive infarction. He also has mild sinus tachycardia at rest.  -- Dr. Excell Seltzer and the patient had an extensive discussion about post MI care and restrictions. He has good insight into his health and will make positive lifestyle changes. We encouraged cardiac rehab. -- Hypercoaguability panel pending at discharge. He has been told to discontinue his adderall.  -- Continue aspirin/Brilinta, carvedilol, lisinopril, Aldactone, and atorvastatin. He will need medication assistance for brilinta. He has been given a 30 day card.  Ischemic cardiomyopathy: LVEF less than 30% by ventriculography but improved post MI day 2 by echo with LVEF 35-40%, wall motion consistent with anterior MI.Fortunately, the patient's LV function has improved. He will not require a LifeVest at hospital discharge as his LVEF is greater than 35%.  -- Continue ACE and BB   Low HDL  cholesterol: 33   Hyperglycemia: hemoglobin A1c 5.3   The patient has had an uncomplicated hospital course and is recovering well. The radial and femoral catheter site are stable.He has been seen by Dr. Excell Seltzer today and deemed ready for discharge home. All follow-up appointments have been scheduled.  A written RX for a 30 day free supply of Brilinta was provided for the patient. A work/school excuse note was provided as well. Discharge medications are listed below.   Discharge Vitals: Blood pressure 114/63, pulse 82, temperature 98.3 F (36.8 C), temperature source Oral, resp. rate 16, height  (1.727 m), weight 174 lb 6.4 oz (79.107 kg), SpO2 99 %.  Labs: Lab Results  Component Value Date   WBC 12.9* 04/29/2014   HGB 16.0 04/29/2014   HCT 46.1 04/29/2014   MCV 86.5 04/29/2014   PLT 303 04/29/2014     Recent Labs Lab 04/28/14 0742 04/29/14 0339  NA 136 137  K 3.8 4.0  CL 100 106  CO2 23 22  BUN 8 9  CREATININE 1.07 0.86  CALCIUM 9.6 8.8  PROT 7.5  --   BILITOT 1.0  --   ALKPHOS 62  --   ALT 36  --   AST 44*  --   GLUCOSE 141* 115*    Recent Labs  04/28/14 1635 04/28/14 2209  TROPONINI >80.00* >80.00*   Lab Results  Component Value Date   CHOL 110 04/29/2014   HDL 33* 04/29/2014   LDLCALC 58 04/29/2014   TRIG 95 04/29/2014      Diagnostic Studies/Procedures   Dg Chest [place]  1 View  04/28/2014   CLINICAL DATA:  c/o central chest pain that started three hours ago. Pt sts he smoked pott last night. CODE STEMI  EXAM: PORTABLE CHEST - 1 VIEW  COMPARISON:  None.  FINDINGS: The heart size and mediastinal contours are within normal limits. Both lungs are clear. The visualized skeletal structures are unremarkable.  IMPRESSION: No active disease.   Electronically Signed   By: Amie Portland M.D.   On: 04/28/2014 07:56     CARDIAC CATHETERIZATION / PCI  05/01/14 HEMODYNAMICS:  AO SYSTOLIC/AO DIASTOLIC: 133/97  LV SYSTOLIC/LV DIASTOLIC: 126/31  ANGIOGRAPHIC  RESULTS:  1. Left main; normal  2. LAD; subtotally occluded proximally with a large thrombus burden and TIMI 1 flow  3. Left circumflex; nondominant and normal.  4. Right coronary artery; dominant and normal with grade 1 right to left collaterals to the LAD  5. Left ventriculography; RAO left ventriculogram was performed using  25 mL of Visipaque dye at 12 mL/second. The overall LVEF estimated  25-30 % With wall motion abnormalities notable for high anterolateral and anteroapical akinesia.   Overall impression: Anterior wall marker infarction in a young [place] male without risk factors due to ruptured proximal LAD plaque with a large thrombus burden. With aspiration thrombectomy, IV Angiomax and Aggrastat, PCI and stenting using drug-eluting stent and therapy including Brilenta. The door to balloon time was 23 minutes. The patient had chest pain 3 hours prior to presentation. I suspect he will have significant improvement in LV function. He'll be treated with a typical medications including beta blocker, high-dose ACE inhibitor, statin drug and dual antiplatelet therapy. He left the lab in stable condition.    2D ECHO: 04/30/2014 LV EF: 35% -  40% Study Conclusions - Left ventricle: The cavity size was normal. Wall thickness was normal. Systolic function was moderately reduced. The estimated ejection fraction was in the range of 35% to 40%. There is akinesis of the anteroseptal and apical myocardium. - Left atrium: The atrium was mildly dilated. Impressions: - Anteroseptal and apical akinesis; overall moderately reduced LV function; prominent apical trabeculae; no thrombus (definity used) but apical swirling noted; mild LAE.    Discharge Medications     Medication List    STOP taking these medications        amphetamine-dextroamphetamine 10 MG 24 hr capsule  Commonly known as:  ADDERALL XR      TAKE these medications        aspirin 81 MG EC tablet  Take 1 tablet  (81 mg total) by mouth daily.     atorvastatin 80 MG tablet  Commonly known as:  LIPITOR  Take 1 tablet (80 mg total) by mouth daily at 6 PM.     carvedilol 3.125 MG tablet  Commonly known as:  COREG  Take 1 tablet (3.125 mg total) by mouth 2 (two) times daily with a meal.     lisinopril 5 MG tablet  Commonly known as:  PRINIVIL,ZESTRIL  Take 1 tablet (5 mg total) by mouth 2 (two) times daily.     nitroGLYCERIN 0.4 MG SL tablet  Commonly known as:  NITROSTAT  Place 1 tablet (0.4 mg total) under the tongue every 5 (five) minutes as needed for chest pain.     spironolactone 25 MG tablet  Commonly known as:  ALDACTONE  Take 0.5 tablets (12.5 mg total) by mouth daily.     ticagrelor 90 MG Tabs tablet  Commonly known as:  BRILINTA  Take 1 tablet (  90 mg total) by mouth 2 (two) times daily.        Disposition   The patient will be discharged in stable condition to home.  Follow-up Information    Follow up with [facility]     On 05/03/2014.   Why:  Hospital Follow Up @ 10:30 PASS Program pt will need assistance with Brilinta.    Contact information:   [address] [place] [address] [phone]      Follow up with Abelino Derrick, PA-C On 05/08/2014.   Specialty:  Cardiology   Why:  @ 10am.   Contact information:   [address] [place] [address] [phone]         Duration of Discharge Encounter: Greater than 30 minutes including physician and PA time.  Byrd Hesselbach R PA-C 05/01/2014, 12:20 PM

## 2014-05-01 NOTE — Progress Notes (Signed)
1030-1108 Pt had walked independently without CP so did not walk with him. MI education completed with pt who voiced understanding. Because of low EF, did given pt CHF booklet and reviewed when to call MD and importance of daily weights and low sodium diet. Offered CRP 2 but pt prefers to exercise on his own. Stressed importance of brilinta with stent. Has booklet in room.  Pt stated doing better with everything now but was overwhelmed when he first arrived and absorbing information. Ready to make needed changes. Luetta Nutting RN BSN 05/01/2014 11:08 AM

## 2014-05-01 NOTE — Telephone Encounter (Signed)
7 DAY TOC PT--APPT 05-08-14 WITH LUKE AT 10A

## 2014-05-01 NOTE — Progress Notes (Signed)
    Subjective:  The patient is feeling better. He did not have shortness of breath yesterday. No chest discomfort.  Objective:  Vital Signs in the last 24 hours: Temp:  [98.3 F (36.8 C)-99.3 F (37.4 C)] 98.3 F (36.8 C) (04/26 0500) Pulse Rate:  [85-102] 85 (04/26 0500) Resp:  [16] 16 (04/26 0500) BP: (101-112)/(46-61) 112/46 mmHg (04/26 0500) SpO2:  [98 %-99 %] 99 % (04/26 0500) Weight:  [174 lb 6.4 oz (79.107 kg)] 174 lb 6.4 oz (79.107 kg) (04/26 0605)  Intake/Output from previous day: 04/25 0701 - 04/26 0700 In: 480 [P.O.:480] Out: -   Physical Exam: Pt is alert and oriented, NAD HEENT: normal Neck: JVP - normal, carotids 2+=  Lungs: CTA bilaterally CV: RRR without murmur. There is no S3 gallop Abd: soft, NT, Positive BS, no hepatomegaly Ext: no C/C/E, distal pulses intact and equal Skin: warm/dry no rash   Lab Results:  Recent Labs  04/28/14 0742 04/29/14 0339  WBC 15.4* 12.9*  HGB 17.0 16.0  PLT 336 303    Recent Labs  04/28/14 0742 04/29/14 0339  NA 136 137  K 3.8 4.0  CL 100 106  CO2 23 22  GLUCOSE 141* 115*  BUN 8 9  CREATININE 1.07 0.86    Recent Labs  04/28/14 1635 04/28/14 2209  TROPONINI >80.00* >80.00*    Cardiac Studies: 2D Echo: Study Conclusions  - Left ventricle: The cavity size was normal. Wall thickness was normal. Systolic function was moderately reduced. The estimated ejection fraction was in the range of 35% to 40%. There is akinesis of the anteroseptal and apical myocardium. - Left atrium: The atrium was mildly dilated.  Impressions:  - Anteroseptal and apical akinesis; overall moderately reduced LV function; prominent apical trabeculae; no thrombus (definity used) but apical swirling noted; mild LAE.  Tele: Sinus rhythm, sinus tachycardia  Assessment/Plan:  1. Acute anterolateral myocardial infarction, status post DES to the proximal LAD 2. Ischemic cardiomyopathy, LVEF less than 30% by  ventriculography but improved post MI day 2 by echo with LVEF 35-40%, wall motion consistent with anterior MI  3. Low HDL cholesterol 4. Hyperglycemia - hemoglobin A1c 5.3  Fortunately, the patient's LV function has improved. He will not require a LifeVest at hospital discharge as his LVEF is greater than 35%. He does have residual anterolateral ST segment elevation suggestive of extensive infarction. He also has mild sinus tachycardia at rest. I have reviewed his medications and would recommend hospital discharge on his current therapy which includes aspirin, brilinta, carvedilol, lisinopril, Aldactone, and atorvastatin. He will need medication assistance for brilinta. He has been given a 30 day card.  We had extensive discussion about post MI care and restrictions. He needs a note to be capped out of work for a minimum of 4 weeks. He is also in school and would like to try to finish his semester. He needs a note for school to explain his timeout for this hospitalization. I reviewed the potential benefit of outpatient cardiac rehabilitation. He has good insight and is determined to work on positive lifestyle changes. He will require an NP/PA follow-up visit for Dr. Allyson Sabal next week.  Tonny Bollman, M.D. 05/01/2014, 7:35 AM

## 2014-05-02 ENCOUNTER — Telehealth: Payer: Self-pay | Admitting: Cardiovascular Disease

## 2014-05-02 LAB — PROTEIN S ACTIVITY: Protein S Activity: 102 % (ref 60–145)

## 2014-05-02 LAB — FACTOR 5 LEIDEN

## 2014-05-02 LAB — PROTEIN C ACTIVITY: Protein C Activity: 108 % (ref 74–151)

## 2014-05-02 LAB — PROTHROMBIN GENE MUTATION

## 2014-05-02 LAB — PROTEIN S, TOTAL: PROTEIN S AG TOTAL: 147 % (ref 58–150)

## 2014-05-02 LAB — LUPUS ANTICOAGULANT PANEL
DRVVT: 33.7 s (ref 0.0–55.1)
PTT Lupus Anticoagulant: 33.9 s (ref 0.0–50.0)

## 2014-05-02 LAB — PROTEIN C, TOTAL: Protein C, Total: 83 % (ref 70–140)

## 2014-05-02 NOTE — Telephone Encounter (Signed)
Needs a TOC phone Call  °

## 2014-05-03 ENCOUNTER — Inpatient Hospital Stay: Payer: Self-pay | Admitting: Family Medicine

## 2014-05-03 LAB — BETA-2-GLYCOPROTEIN I ABS, IGG/M/A: Beta-2 Glyco I IgG: UNDETERMINED GPI IgG units

## 2014-05-03 LAB — CARDIOLIPIN ANTIBODIES, IGG, IGM, IGA: Anticardiolipin IgG: UNDETERMINED GPL U/mL

## 2014-05-03 NOTE — Progress Notes (Signed)
Not at this time.

## 2014-05-04 NOTE — Telephone Encounter (Signed)
number currently listed in patients chart is disconnected

## 2014-05-08 ENCOUNTER — Ambulatory Visit (INDEPENDENT_AMBULATORY_CARE_PROVIDER_SITE_OTHER): Payer: Self-pay | Admitting: Cardiology

## 2014-05-08 ENCOUNTER — Encounter: Payer: Self-pay | Admitting: Cardiology

## 2014-05-08 VITALS — BP 108/70 | HR 64 | Ht 68.0 in | Wt 175.0 lb

## 2014-05-08 DIAGNOSIS — I2510 Atherosclerotic heart disease of native coronary artery without angina pectoris: Secondary | ICD-10-CM

## 2014-05-08 DIAGNOSIS — I2102 ST elevation (STEMI) myocardial infarction involving left anterior descending coronary artery: Secondary | ICD-10-CM

## 2014-05-08 DIAGNOSIS — Z5181 Encounter for therapeutic drug level monitoring: Secondary | ICD-10-CM

## 2014-05-08 DIAGNOSIS — Z9861 Coronary angioplasty status: Secondary | ICD-10-CM

## 2014-05-08 DIAGNOSIS — I2109 ST elevation (STEMI) myocardial infarction involving other coronary artery of anterior wall: Secondary | ICD-10-CM

## 2014-05-08 DIAGNOSIS — I255 Ischemic cardiomyopathy: Secondary | ICD-10-CM

## 2014-05-08 LAB — BASIC METABOLIC PANEL
BUN: 12 mg/dL (ref 6–23)
CO2: 31 mEq/L (ref 19–32)
Calcium: 10.3 mg/dL (ref 8.4–10.5)
Chloride: 102 mEq/L (ref 96–112)
Creatinine, Ser: 1.02 mg/dL (ref 0.40–1.50)
GFR: 94.51 mL/min (ref >60.00)
Glucose, Bld: 83 mg/dL (ref 70–99)
Potassium: 4.1 mEq/L (ref 3.5–5.1)
Sodium: 138 mEq/L (ref 135–145)

## 2014-05-08 NOTE — Assessment & Plan Note (Signed)
04/29/13: 2D ECHO: LVEF 35-40%

## 2014-05-08 NOTE — Patient Instructions (Signed)
Medication Instructions:  Your physician recommends that you continue on your current medications as directed. Please refer to the Current Medication list given to you today.   Labwork: BMET    Testing/Procedures: Your physician has requested that you have an echocardiogram. In July WEEK OF 26   Echocardiography is a painless test that uses sound waves to create images of your heart. It provides your doctor with information about the size and shape of your heart and how well your heart's chambers and valves are working. This procedure takes approximately one hour. There are no restrictions for this procedure.    Follow-Up: With DR Allyson Sabal AFTER ECHO   Any Other Special Instructions Will Be Listed Below (If Applicable).

## 2014-05-08 NOTE — Progress Notes (Signed)
05/08/2014 Chad Fitzgerald   Dec 16, 1990  [id number]  Primary Physician No primary care provider on file. Primary Cardiologist: Dr Eldridge Dace  HPI:  24 y.o. male with a history of ADD on adderall but no other PMHx who presented to [facility] on 04/28/14 with severe substernal CP and SOB and found to have acute anterolateral STEMI. His Troponin peaked > 80. Urgent cath revealed ruptured plaque in the LAD. He was taken to the cath lab and had thrombectomy and a DES placed. His EF at cath was 25-30%, echo before discharge was 35-40%. He is seen in the office today for follow up. He has been doing well. 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 He has not been back to work. Dr Excell Seltzer had suggested no work for 4 weeks.    Current Outpatient Prescriptions  Medication Sig Dispense Refill  . aspirin EC 81 MG EC tablet Take 1 tablet (81 mg total) by mouth daily.    Marland Kitchen atorvastatin (LIPITOR) 80 MG tablet Take 1 tablet (80 mg total) by mouth daily at 6 PM. 30 tablet 11  . carvedilol (COREG) 3.125 MG tablet Take 1 tablet (3.125 mg total) by mouth 2 (two) times daily with a meal. 60 tablet 11  . lisinopril (PRINIVIL,ZESTRIL) 5 MG tablet Take 1 tablet (5 mg total) by mouth 2 (two) times daily. 60 tablet 11  . nitroGLYCERIN (NITROSTAT) 0.4 MG SL tablet Place 1 tablet (0.4 mg total) under the tongue every 5 (five) minutes as needed for chest pain. 25 tablet 12  . spironolactone (ALDACTONE) 25 MG tablet Take 0.5 tablets (12.5 mg total) by mouth daily. 30 tablet 11  . ticagrelor (BRILINTA) 90 MG TABS tablet Take 1 tablet (90 mg total) by mouth 2 (two) times daily. 60 tablet 11   No current facility-administered medications for this visit.    Allergies  Allergen Reactions  . Bee Pollen Hives, Itching and Rash    History   Social History  . Marital Status: Single    Spouse Name: N/A  . Number of Children: N/A  . Years of Education: N/A   Occupational  History  . Not on file.   Social History Main Topics  . Smoking status: Never Smoker   . Smokeless tobacco: Not on file  . Alcohol Use: Not on file  . Drug Use: 2.00 per week    Special: Marijuana  . Sexual Activity: Not on file   Other Topics Concern  . Not on file   Social History Narrative     Review of Systems: General: negative for chills, fever, night sweats or weight changes.  Cardiovascular: negative for chest pain, dyspnea on exertion, edema, orthopnea, palpitations, paroxysmal nocturnal dyspnea or shortness of breath Dermatological: negative for rash Respiratory: negative for cough or wheezing Urologic: negative for hematuria Abdominal: negative for nausea, vomiting, diarrhea, bright red blood per rectum, melena, or hematemesis Neurologic: negative for visual changes, syncope, or dizziness All other systems reviewed and are otherwise negative except as noted above.    Blood pressure 108/70, pulse 64, height 5\' 8"  (1.727 m), weight 175 lb (79.379 kg).  General appearance: alert, cooperative and no distress Neck: no carotid bruit and no JVD Lungs: clear to auscultation bilaterally Heart: regular rate and rhythm Abdomen: soft, non-tender; bowel sounds normal; no masses,  no organomegaly Extremities: extremities normal, atraumatic, no cyanosis or edema Skin: Skin color, texture, turgor normal. No rashes or lesions Neurologic: Grossly normal  EKG NSR, AS Qs and TWI  ASSESSMENT AND PLAN:   STEMI (ST elevation myocardial infarction) 04/28/14   CAD S/P LAD PCI 04/28/14: acute anterolateral STEMI s/p DES to LAD   Ischemic cardiomyopathy 04/29/13: 2D ECHO: LVEF 35-40%    PLAN  Same Rx for now. His B/P is borderline low so I did not increase his medications. He'll need a follow up echo in 3 months and a f/u with Dr Allyson Sabal after this. He did ask about resuming his medications for ADD and I asked him to hold off for now and ask Dr Allyson Sabal when he sees him.    Olson Lucarelli KPA-C 05/08/2014 1:21 PM

## 2014-05-08 NOTE — Assessment & Plan Note (Signed)
04/28/14: acute anterolateral STEMI s/p DES to LAD

## 2014-05-08 NOTE — Assessment & Plan Note (Signed)
04/28/14 

## 2014-05-28 ENCOUNTER — Telehealth: Payer: Self-pay | Admitting: Cardiovascular Disease

## 2014-05-28 NOTE — Telephone Encounter (Signed)
Walk in pt form-AZ&me paper dropped off gave to [facility]

## 2014-05-30 ENCOUNTER — Telehealth: Payer: Self-pay | Admitting: Pharmacist Clinician (PhC)/ Clinical Pharmacy Specialist

## 2014-05-30 NOTE — Telephone Encounter (Signed)
The Az and Me form is filled out and ready to be picked up. I called patient and left him a message on his answering machine.

## 2014-05-30 NOTE — Telephone Encounter (Signed)
Patient wanted you to know that you will be receiving a form from the [employer] office for his medication assistance that will need to be signed by Dr. Allyson Sabal.

## 2014-07-06 ENCOUNTER — Emergency Department (HOSPITAL_COMMUNITY)
Admission: EM | Admit: 2014-07-06 | Discharge: 2014-07-06 | Disposition: A | Discharge status: Home / Self Care | Payer: Self-pay | Attending: Emergency Medicine | Admitting: Emergency Medicine

## 2014-07-06 ENCOUNTER — Emergency Department (HOSPITAL_COMMUNITY): Payer: Self-pay

## 2014-07-06 ENCOUNTER — Encounter (HOSPITAL_COMMUNITY): Payer: Self-pay | Admitting: Neurology

## 2014-07-06 DIAGNOSIS — I252 Old myocardial infarction: Secondary | ICD-10-CM | POA: Insufficient documentation

## 2014-07-06 DIAGNOSIS — N201 Calculus of ureter: Secondary | ICD-10-CM | POA: Insufficient documentation

## 2014-07-06 DIAGNOSIS — I10 Essential (primary) hypertension: Secondary | ICD-10-CM | POA: Insufficient documentation

## 2014-07-06 DIAGNOSIS — Z8659 Personal history of other mental and behavioral disorders: Secondary | ICD-10-CM | POA: Insufficient documentation

## 2014-07-06 DIAGNOSIS — I2510 Atherosclerotic heart disease of native coronary artery without angina pectoris: Secondary | ICD-10-CM | POA: Insufficient documentation

## 2014-07-06 DIAGNOSIS — Z7982 Long term (current) use of aspirin: Secondary | ICD-10-CM | POA: Insufficient documentation

## 2014-07-06 DIAGNOSIS — M549 Dorsalgia, unspecified: Secondary | ICD-10-CM | POA: Insufficient documentation

## 2014-07-06 DIAGNOSIS — Z7901 Long term (current) use of anticoagulants: Secondary | ICD-10-CM | POA: Insufficient documentation

## 2014-07-06 DIAGNOSIS — J45909 Unspecified asthma, uncomplicated: Secondary | ICD-10-CM | POA: Insufficient documentation

## 2014-07-06 DIAGNOSIS — Z79899 Other long term (current) drug therapy: Secondary | ICD-10-CM | POA: Insufficient documentation

## 2014-07-06 HISTORY — DX: Essential (primary) hypertension: I10

## 2014-07-06 HISTORY — DX: Unspecified asthma, uncomplicated: J45.909

## 2014-07-06 LAB — URINALYSIS, ROUTINE W REFLEX MICROSCOPIC
Glucose, UA: NEGATIVE mg/dL
KETONES UR: 40 mg/dL — AB
NITRITE: POSITIVE — AB
Protein, ur: 100 mg/dL — AB
Specific Gravity, Urine: 1.027 (ref 1.005–1.030)
UROBILINOGEN UA: 1 mg/dL (ref 0.0–1.0)
pH: 5 (ref 5.0–8.0)

## 2014-07-06 LAB — CBC WITH DIFFERENTIAL/PLATELET
BASOS ABS: 0.1 10*3/uL (ref 0.0–0.1)
BASOS PCT: 1 % (ref 0–1)
EOS ABS: 0.1 10*3/uL (ref 0.0–0.7)
Eosinophils Relative: 1 % (ref 0–5)
HCT: 46.1 % (ref 39.0–52.0)
Hemoglobin: 16.4 g/dL (ref 13.0–17.0)
Lymphocytes Relative: 28 % (ref 12–46)
Lymphs Abs: 2.5 10*3/uL (ref 0.7–4.0)
MCH: 29.7 pg (ref 26.0–34.0)
MCHC: 35.6 g/dL (ref 30.0–36.0)
MCV: 83.5 fL (ref 78.0–100.0)
MONOS PCT: 9 % (ref 3–12)
Monocytes Absolute: 0.8 10*3/uL (ref 0.1–1.0)
NEUTROS ABS: 5.5 10*3/uL (ref 1.7–7.7)
NEUTROS PCT: 61 % (ref 43–77)
PLATELETS: 313 10*3/uL (ref 150–400)
RBC: 5.52 MIL/uL (ref 4.22–5.81)
RDW: 13 % (ref 11.5–15.5)
WBC: 8.9 10*3/uL (ref 4.0–10.5)

## 2014-07-06 LAB — URINE MICROSCOPIC-ADD ON

## 2014-07-06 LAB — COMPREHENSIVE METABOLIC PANEL
ALK PHOS: 62 U/L (ref 38–126)
ALT: 27 U/L (ref 17–63)
ANION GAP: 10 (ref 5–15)
AST: 20 U/L (ref 15–41)
Albumin: 4.2 g/dL (ref 3.5–5.0)
BUN: 12 mg/dL (ref 6–20)
CALCIUM: 9.3 mg/dL (ref 8.9–10.3)
CHLORIDE: 106 mmol/L (ref 101–111)
CO2: 21 mmol/L — AB (ref 22–32)
Creatinine, Ser: 0.99 mg/dL (ref 0.61–1.24)
GFR calc Af Amer: >60 mL/min (ref >60)
GFR calc non Af Amer: >60 mL/min (ref >60)
GLUCOSE: 103 mg/dL — AB (ref 65–99)
POTASSIUM: 4.2 mmol/L (ref 3.5–5.1)
SODIUM: 137 mmol/L (ref 135–145)
Total Bilirubin: 1.2 mg/dL (ref 0.3–1.2)
Total Protein: 7 g/dL (ref 6.5–8.1)

## 2014-07-06 LAB — LIPASE, BLOOD: Lipase: 28 U/L (ref 22–51)

## 2014-07-06 LAB — I-STAT TROPONIN, ED: Troponin i, poc: 0.02 ng/mL (ref 0.00–0.08)

## 2014-07-06 MED ORDER — TAMSULOSIN HCL 0.4 MG PO CAPS: 0.4000 mg | ORAL_CAPSULE | Freq: Every day | ORAL | Status: DC

## 2014-07-06 MED ORDER — OXYCODONE-ACETAMINOPHEN 5-325 MG PO TABS
1.0000 | ORAL_TABLET | Freq: Once | ORAL | Status: AC
  Administered 2014-07-06: 1 via ORAL
  Filled 2014-07-06: qty 1

## 2014-07-06 MED ORDER — HYDROMORPHONE HCL 1 MG/ML IJ SOLN
1.0000 mg | Freq: Once | INTRAMUSCULAR | Status: AC
  Administered 2014-07-06: 1 mg via INTRAVENOUS
  Filled 2014-07-06: qty 1

## 2014-07-06 MED ORDER — SODIUM CHLORIDE 0.9 % IV BOLUS (SEPSIS)
1000.0000 mL | Freq: Once | INTRAVENOUS | Status: AC
  Administered 2014-07-06: 1000 mL via INTRAVENOUS

## 2014-07-06 MED ORDER — ONDANSETRON HCL 4 MG/2ML IJ SOLN
4.0000 mg | Freq: Once | INTRAMUSCULAR | Status: AC
  Administered 2014-07-06: 4 mg via INTRAVENOUS
  Filled 2014-07-06: qty 2

## 2014-07-06 MED ORDER — ONDANSETRON 4 MG PO TBDP: 4.0000 mg | ORAL_TABLET | Freq: Three times a day (TID) | ORAL | Status: DC | PRN

## 2014-07-06 MED ORDER — OXYCODONE-ACETAMINOPHEN 5-325 MG PO TABS: 1.0000 | ORAL_TABLET | Freq: Four times a day (QID) | ORAL | Status: DC | PRN

## 2014-07-06 MED ORDER — HYDROMORPHONE HCL 1 MG/ML IJ SOLN: 1.0000 mg | Freq: Once | INTRAMUSCULAR | Status: DC

## 2014-07-06 NOTE — ED Notes (Signed)
Pt reports today RUQ ab pain into right flank just started this morning. Denies vomiting. Pt appears uncomfortable in triage. Reports a few days ago bloody urine.

## 2014-07-06 NOTE — Discharge Instructions (Signed)
Please read and follow all provided instructions.  Your diagnoses today include:  1. Right ureteral stone   2. Back pain    Tests performed today include:  Urine test that showed blood in your urine and no definite infection  CT scan which showed a 3-25mm millimeter kidney on the right side  Blood test that showed normal kidney function  Vital signs. See below for your results today.   Medications prescribed:   Percocet (oxycodone/acetaminophen) - narcotic pain medication  DO NOT drive or perform any activities that require you to be awake and alert because this medicine can make you drowsy. BE VERY CAREFUL not to take multiple medicines containing Tylenol (also called acetaminophen). Doing so can lead to an overdose which can damage your liver and cause liver failure and possibly death.   Flomax (tamsulosin) - relaxes smooth muscle to help kidney stones pass   Zofran (ondansetron) - for nausea and vomiting  Take any prescribed medications only as directed.  Home care instructions:  Follow any educational materials contained in this packet.  Please double your fluid intake for the next several days. Strain your urine and save any stones that may pass.   BE VERY CAREFUL not to take multiple medicines containing Tylenol (also called acetaminophen). Doing so can lead to an overdose which can damage your liver and cause liver failure and possibly death.   Follow-up instructions: Please follow-up with your urologist or the urologist referral (provided on front page) in the next 1 week for further evaluation of your symptoms.  If you need to return to the Emergency Department, go to [facility] and not [facility]. The urologists are located at [facility] and can better care for you at this location.  Return instructions:  If you need to return to the Emergency Department, go to [facility] and not [facility]. The urologists are located at  [facility] and can better care for you at this location.   Please return to the Emergency Department if you experience worsening symptoms.  Please return if you develop fever or uncontrolled pain or vomiting.  Please return if you have any other emergent concerns.  Additional Information:  Your vital signs today were: BP 135/74 mmHg   Pulse 87   Temp(Src) 98.3 F (36.8 C) (Oral)   Resp 18   SpO2 98% If your blood pressure (BP) was elevated above 135/85 this visit, please have this repeated by your doctor within one month. --------------

## 2014-07-06 NOTE — ED Provider Notes (Signed)
6:04 PM Patient handoff from Patel-Mills PA-C at shift change. Pt pending UA after CT demonstrated 3-60mm R ureteral stone.   Patient seen and updated on results. Pain is currently controlled.  Patient had no previous symptoms of a urinary tract infection for pyelonephritis. He is afebrile. WBC count is normal. Appears nontoxic. Discussed UA findings with patient. At this point, do not feel that these findings clinically represent complicated infection. Urine culture sent.  Patient counseled on kidney stone treatment. Urged patient to strain urine and save any stones. Urged urology follow-up and return to [facility] with any complications. Counseled patient to maintain good fluid intake.   Avoid NSAIDs due to recent MI. Patient is on Brillinta.   Counseled patient on use of Flomax.   Patient counseled on use of narcotic pain medications. Counseled not to combine these medications with others containing tylenol. Urged not to drink alcohol, drive, or perform any other activities that requires focus while taking these medications. The patient verbalizes understanding and agrees with the plan.  BP 135/74 mmHg  Pulse 87  Temp(Src) 98.3 F (36.8 C) (Oral)  Resp 18  SpO2 98%    Renne Crigler, PA-C 07/06/14 1807  Jerelyn Scott, MD 07/06/14 1807

## 2014-07-06 NOTE — ED Provider Notes (Signed)
CSN: [id number]     Arrival date & time 07/06/14  1237 History   First MD Initiated Contact with Patient 07/06/14 1308     Chief Complaint  Patient presents with  . Abdominal Pain     (Consider location/radiation/quality/duration/timing/severity/associated sxs/prior Treatment) Patient is a 24 y.o. male presenting with abdominal pain. The history is provided by the patient. No language interpreter was used.  Abdominal Pain Associated symptoms: nausea   Associated symptoms: no vomiting   Chad Fitzgerald is a 24 y.o male With a history of ischemic cardiomyopathy and anterior lateral STEMI who is currently anticoagulated with Brilinta and presents for right-sided back pain. He states it began suddenly at 10 AM this morningand has progressively worsened. He describes it as a stabbing constant pain. He denies having this pain in the past. He has not taken anything prior to arrival. He rates his pain at 10 out of 10. He also states he had blood in his urine 2 days ago but it has since resolved.he denies a history of kidney stones. He also denies any fever, chills, chest pain, shortness of breath, vomiting, diarrhea, dysuria, urinary frequency.  Past Medical History  Diagnosis Date  . ADD (attention deficit disorder)   . CAD (coronary artery disease)     a. 04/28/14: acute anterolateral STEMI s/p DES to LAD  . Ischemic cardiomyopathy     a. 04/29/13: 2D ECHO: LVEF 35-40%  . Low serum HDL   . Hyperglycemia     a. 04/2014: Hg A1c 5.3  . Asthma     as a child  . Hypertension    Past Surgical History  Procedure Laterality Date  . Appendectomy    . Left heart cath N/A 04/28/2014    Procedure: LEFT HEART CATH;  Surgeon: Runell Gess, MD;  Location: [facility] CATH LAB;  Service: Cardiovascular;  Laterality: N/A;   Family History  Problem Relation Age of Onset  . Heart attack Neg Hx   . Stroke Neg Hx   . Hypertension Father    History  Substance Use Topics  . Smoking status: Never Smoker   .  Smokeless tobacco: Not on file  . Alcohol Use: No    Review of Systems  Gastrointestinal: Positive for nausea and abdominal pain. Negative for vomiting.  Genitourinary: Positive for flank pain.  All other systems reviewed and are negative.     Allergies  Bee pollen  Home Medications   Prior to Admission medications   Medication Sig Start Date End Date Taking? Authorizing Provider  aspirin EC 81 MG EC tablet Take 1 tablet (81 mg total) by mouth daily. 05/01/14  Yes Janetta Hora, PA-C  atorvastatin (LIPITOR) 80 MG tablet Take 1 tablet (80 mg total) by mouth daily at 6 PM. 05/01/14  Yes Janetta Hora, PA-C  carvedilol (COREG) 3.125 MG tablet Take 1 tablet (3.125 mg total) by mouth 2 (two) times daily with a meal. 05/01/14  Yes Janetta Hora, PA-C  lisinopril (PRINIVIL,ZESTRIL) 5 MG tablet Take 1 tablet (5 mg total) by mouth 2 (two) times daily. 05/01/14  Yes Janetta Hora, PA-C  nitroGLYCERIN (NITROSTAT) 0.4 MG SL tablet Place 1 tablet (0.4 mg total) under the tongue every 5 (five) minutes as needed for chest pain. 05/01/14  Yes Janetta Hora, PA-C  spironolactone (ALDACTONE) 25 MG tablet Take 0.5 tablets (12.5 mg total) by mouth daily. 05/01/14  Yes Janetta Hora, PA-C  ticagrelor (BRILINTA) 90 MG TABS tablet Take 1 tablet (90  mg total) by mouth 2 (two) times daily. 05/01/14  Yes Janetta Hora, PA-C   BP 121/80 mmHg  Pulse 85  Temp(Src) 98.3 F (36.8 C) (Oral)  Resp 18  SpO2 98% Physical Exam  Constitutional: He is oriented to person, place, and time. Vital signs are normal. He appears well-developed and well-nourished. He appears distressed.  Patient in moderate distress and trying to find a comfortable position.  HENT:  Head: Normocephalic and atraumatic.  Eyes: Conjunctivae are normal.  Neck: Normal range of motion. Neck supple.  Cardiovascular: Normal rate, regular rhythm and normal heart sounds.   Pulmonary/Chest: Effort normal and breath  sounds normal. No respiratory distress. He has no wheezes. He has no rales.  Abdominal: Soft. Normal appearance. He exhibits no distension and no mass. There is no tenderness. There is CVA tenderness. There is no rigidity, no rebound, no guarding, no tenderness at McBurney's point and negative Murphy's sign.  Right-sided CVA tenderness to palpation. Positive Lloyd's punch.  Musculoskeletal: Normal range of motion.  Neurological: He is alert and oriented to person, place, and time.  Skin: Skin is warm and dry.  Psychiatric: He has a normal mood and affect. His behavior is normal.  Nursing note and vitals reviewed.   ED Course  Procedures (including critical care time) Labs Review Labs Reviewed  COMPREHENSIVE METABOLIC PANEL - Abnormal; Notable for the following:    CO2 21 (*)    Glucose, Bld 103 (*)    All other components within normal limits  CBC WITH DIFFERENTIAL/PLATELET  LIPASE, BLOOD  URINALYSIS, ROUTINE W REFLEX MICROSCOPIC (NOT AT [facility])  Chad Fitzgerald, ED    Imaging Review Ct Renal Stone Study  07/06/2014   CLINICAL DATA:  Right upper quadrant/ flank pain.  Recent hematuria.  EXAM: CT ABDOMEN AND PELVIS WITHOUT CONTRAST  TECHNIQUE: Multidetector CT imaging of the abdomen and pelvis was performed following the standard protocol without IV contrast.  COMPARISON:  Plain film 06/20/2003  FINDINGS: Lower chest: Motion degradation. Normal heart size without pericardial or pleural effusion.  Hepatobiliary: Normal liver. Multiple gallstones. Calcification along the gallbladder wall, likely due to adherent stones. No biliary duct dilatation.  Pancreas: Normal, without mass or ductal dilatation.  Spleen: Normal  Adrenals/Urinary Tract: Normal adrenal glands. No renal calculi. Mild right-sided hydroureteronephrosis secondary to a 3x6 mm mid to distal right ureteric stone on image 71.  Stomach/Bowel: Normal stomach, without wall thickening. Normal colon and terminal ileum. Normal small bowel.   Vascular/Lymphatic: Normal caliber of the aorta and branch vessels. No abdominopelvic adenopathy.  Reproductive: Normal prostate.  Other: No significant free fluid.  Musculoskeletal: Developmental variant within the T11 vertebral body.  IMPRESSION: 1. Mild right-sided hydroureteronephrosis secondary to a mid to distal right ureteric stone. 2. Cholelithiasis.   Electronically Signed   By: Jeronimo Greaves M.D.   On: 07/06/2014 15:50     EKG interpretation 07/06/2014 12:42:37  Vent. rate 73 BPM PR interval 138 ms QRS duration 94 ms QT/QTc 378/416 ms P-R-T axes 38 101 77 Normal sinus rhythm I agree with the interpretation results, Catha Gosselin, PA-C.  MDM   Final diagnoses:  Back pain  Right ureteral stone   Patient's vitals are stable. His labs are unremarkable and kidney function is normal. Troponin and EKG are normal.  They were ordered prior to patient coming back from triage. CT renal stone study shows mild right sided hydroureteronephrosis secondary to a mid to distal right ureteric stone and cholelithiasis.  Medications  oxyCODONE-acetaminophen (PERCOCET/ROXICET) 5-325 MG  per tablet 1 tablet (not administered)  HYDROmorphone (DILAUDID) injection 1 mg (1 mg Intravenous Given 07/06/14 1345)  ondansetron (ZOFRAN) injection 4 mg (4 mg Intravenous Given 07/06/14 1343)  sodium chloride 0.9 % bolus 1,000 mL (1,000 mLs Intravenous New Bag/Given 07/06/14 1440)  HYDROmorphone (DILAUDID) injection 1 mg (1 mg Intravenous Given 07/06/14 1441)  ondansetron (ZOFRAN) injection 4 mg (4 mg Intravenous Given 07/06/14 1439)   I signed this patient out to Rhea Bleacher, PA-C and discussed that the plan was to wait for the pending UA.  He will need to go home on Flomax and pain medication with urology follow up. I discussed the plan with the patient.     Catha Gosselin, PA-C 07/06/14 1638  Donnetta Hutching, MD 07/07/14 [phone]

## 2014-07-08 LAB — URINE CULTURE: CULTURE: NO GROWTH

## 2014-07-31 ENCOUNTER — Other Ambulatory Visit (HOSPITAL_COMMUNITY): Payer: Self-pay

## 2014-08-07 ENCOUNTER — Ambulatory Visit: Payer: Self-pay | Admitting: Cardiovascular Disease

## 2014-12-21 ENCOUNTER — Observation Stay (HOSPITAL_COMMUNITY)
Admission: EM | Admit: 2014-12-21 | Discharge: 2014-12-24 | Disposition: A | Discharge status: Home / Self Care | Payer: Self-pay | Attending: Student in an Organized Health Care Education/Training Program | Admitting: Student in an Organized Health Care Education/Training Program

## 2014-12-21 ENCOUNTER — Observation Stay (HOSPITAL_BASED_OUTPATIENT_CLINIC_OR_DEPARTMENT_OTHER): Payer: Self-pay

## 2014-12-21 ENCOUNTER — Emergency Department (HOSPITAL_COMMUNITY): Payer: Self-pay

## 2014-12-21 ENCOUNTER — Encounter (HOSPITAL_COMMUNITY): Payer: Self-pay | Admitting: Emergency Medicine

## 2014-12-21 DIAGNOSIS — Z9861 Coronary angioplasty status: Secondary | ICD-10-CM

## 2014-12-21 DIAGNOSIS — R079 Chest pain, unspecified: Secondary | ICD-10-CM

## 2014-12-21 DIAGNOSIS — Z7902 Long term (current) use of antithrombotics/antiplatelets: Secondary | ICD-10-CM | POA: Insufficient documentation

## 2014-12-21 DIAGNOSIS — F988 Other specified behavioral and emotional disorders with onset usually occurring in childhood and adolescence: Secondary | ICD-10-CM | POA: Insufficient documentation

## 2014-12-21 DIAGNOSIS — K219 Gastro-esophageal reflux disease without esophagitis: Secondary | ICD-10-CM | POA: Insufficient documentation

## 2014-12-21 DIAGNOSIS — D6852 Prothrombin gene mutation: Secondary | ICD-10-CM | POA: Insufficient documentation

## 2014-12-21 DIAGNOSIS — R0602 Shortness of breath: Secondary | ICD-10-CM | POA: Insufficient documentation

## 2014-12-21 DIAGNOSIS — I10 Essential (primary) hypertension: Secondary | ICD-10-CM | POA: Insufficient documentation

## 2014-12-21 DIAGNOSIS — Z7982 Long term (current) use of aspirin: Secondary | ICD-10-CM | POA: Insufficient documentation

## 2014-12-21 DIAGNOSIS — J45909 Unspecified asthma, uncomplicated: Secondary | ICD-10-CM | POA: Insufficient documentation

## 2014-12-21 DIAGNOSIS — I2510 Atherosclerotic heart disease of native coronary artery without angina pectoris: Secondary | ICD-10-CM

## 2014-12-21 DIAGNOSIS — R072 Precordial pain: Principal | ICD-10-CM | POA: Insufficient documentation

## 2014-12-21 DIAGNOSIS — I252 Old myocardial infarction: Secondary | ICD-10-CM | POA: Insufficient documentation

## 2014-12-21 DIAGNOSIS — Z87891 Personal history of nicotine dependence: Secondary | ICD-10-CM | POA: Insufficient documentation

## 2014-12-21 DIAGNOSIS — I255 Ischemic cardiomyopathy: Secondary | ICD-10-CM | POA: Diagnosis present

## 2014-12-21 DIAGNOSIS — R9439 Abnormal result of other cardiovascular function study: Secondary | ICD-10-CM | POA: Insufficient documentation

## 2014-12-21 DIAGNOSIS — I2109 ST elevation (STEMI) myocardial infarction involving other coronary artery of anterior wall: Secondary | ICD-10-CM | POA: Diagnosis present

## 2014-12-21 DIAGNOSIS — R7303 Prediabetes: Secondary | ICD-10-CM | POA: Insufficient documentation

## 2014-12-21 DIAGNOSIS — I200 Unstable angina: Secondary | ICD-10-CM | POA: Diagnosis present

## 2014-12-21 DIAGNOSIS — Z955 Presence of coronary angioplasty implant and graft: Secondary | ICD-10-CM | POA: Insufficient documentation

## 2014-12-21 HISTORY — DX: Acute myocardial infarction, unspecified: I21.9

## 2014-12-21 LAB — LIPID PANEL
CHOL/HDL RATIO: 3 ratio
CHOLESTEROL: 119 mg/dL (ref 0–200)
HDL: 40 mg/dL — AB (ref >40)
LDL Cholesterol: 74 mg/dL (ref 0–99)
Triglycerides: 27 mg/dL (ref <150)
VLDL: 5 mg/dL (ref 0–40)

## 2014-12-21 LAB — CBC WITH DIFFERENTIAL/PLATELET
Basophils Absolute: 0.1 10*3/uL (ref 0.0–0.1)
Basophils Relative: 1 %
Eosinophils Absolute: 0.2 10*3/uL (ref 0.0–0.7)
Eosinophils Relative: 2 %
HEMATOCRIT: 46.2 % (ref 39.0–52.0)
Hemoglobin: 16.4 g/dL (ref 13.0–17.0)
LYMPHS PCT: 35 %
Lymphs Abs: 3.4 10*3/uL (ref 0.7–4.0)
MCH: 30.2 pg (ref 26.0–34.0)
MCHC: 35.5 g/dL (ref 30.0–36.0)
MCV: 85.1 fL (ref 78.0–100.0)
MONOS PCT: 11 %
Monocytes Absolute: 1 10*3/uL (ref 0.1–1.0)
NEUTROS PCT: 51 %
Neutro Abs: 5 10*3/uL (ref 1.7–7.7)
Platelets: 307 10*3/uL (ref 150–400)
RBC: 5.43 MIL/uL (ref 4.22–5.81)
RDW: 13.2 % (ref 11.5–15.5)
WBC: 9.6 10*3/uL (ref 4.0–10.5)

## 2014-12-21 LAB — BASIC METABOLIC PANEL
Anion gap: 9 (ref 5–15)
BUN: 12 mg/dL (ref 6–20)
CHLORIDE: 107 mmol/L (ref 101–111)
CO2: 22 mmol/L (ref 22–32)
Calcium: 9.5 mg/dL (ref 8.9–10.3)
Creatinine, Ser: 0.9 mg/dL (ref 0.61–1.24)
GFR calc Af Amer: >60 mL/min (ref >60)
GFR calc non Af Amer: >60 mL/min (ref >60)
GLUCOSE: 97 mg/dL (ref 65–99)
Potassium: 4.1 mmol/L (ref 3.5–5.1)
Sodium: 138 mmol/L (ref 135–145)

## 2014-12-21 LAB — I-STAT TROPONIN, ED: Troponin i, poc: 0.01 ng/mL (ref 0.00–0.08)

## 2014-12-21 LAB — RAPID URINE DRUG SCREEN, HOSP PERFORMED
AMPHETAMINES: NOT DETECTED
BARBITURATES: POSITIVE — AB
Benzodiazepines: NOT DETECTED
Cocaine: NOT DETECTED
OPIATES: NOT DETECTED
TETRAHYDROCANNABINOL: POSITIVE — AB

## 2014-12-21 LAB — TROPONIN I
Troponin I: <0.03 ng/mL (ref <0.031)
Troponin I: <0.03 ng/mL (ref <0.031)
Troponin I: <0.03 ng/mL (ref <0.031)

## 2014-12-21 MED ORDER — ATORVASTATIN CALCIUM 80 MG PO TABS
80.0000 mg | ORAL_TABLET | Freq: Every day | ORAL | Status: DC
  Administered 2014-12-21 – 2014-12-24 (×4): 80 mg via ORAL
  Filled 2014-12-21 (×4): qty 1

## 2014-12-21 MED ORDER — MORPHINE SULFATE (PF) 4 MG/ML IV SOLN: 4.0000 mg | Freq: Once | INTRAVENOUS | Status: DC | PRN

## 2014-12-21 MED ORDER — TICAGRELOR 90 MG PO TABS
90.0000 mg | ORAL_TABLET | Freq: Two times a day (BID) | ORAL | Status: DC
  Administered 2014-12-21 – 2014-12-24 (×6): 90 mg via ORAL
  Filled 2014-12-21 (×7): qty 1

## 2014-12-21 MED ORDER — INSULIN ASPART 100 UNIT/ML ~~LOC~~ SOLN: 0.0000 [IU] | Freq: Three times a day (TID) | SUBCUTANEOUS | Status: DC

## 2014-12-21 MED ORDER — ACETAMINOPHEN 325 MG PO TABS: 650.0000 mg | ORAL_TABLET | ORAL | Status: DC | PRN

## 2014-12-21 MED ORDER — NITROGLYCERIN 0.4 MG SL SUBL
0.4000 mg | SUBLINGUAL_TABLET | SUBLINGUAL | Status: DC | PRN
  Administered 2014-12-21 – 2014-12-24 (×4): 0.4 mg via SUBLINGUAL
  Filled 2014-12-21 (×2): qty 1

## 2014-12-21 MED ORDER — KETOROLAC TROMETHAMINE 30 MG/ML IJ SOLN
30.0000 mg | Freq: Once | INTRAMUSCULAR | Status: AC
  Administered 2014-12-21: 30 mg via INTRAVENOUS
  Filled 2014-12-21: qty 1

## 2014-12-21 MED ORDER — GI COCKTAIL ~~LOC~~
30.0000 mL | Freq: Once | ORAL | Status: AC
  Administered 2014-12-21: 30 mL via ORAL
  Filled 2014-12-21: qty 30

## 2014-12-21 MED ORDER — LORAZEPAM 1 MG PO TABS
0.5000 mg | ORAL_TABLET | Freq: Once | ORAL | Status: AC
  Administered 2014-12-21: 0.5 mg via ORAL
  Filled 2014-12-21: qty 1

## 2014-12-21 MED ORDER — ONDANSETRON HCL 4 MG/2ML IJ SOLN: 4.0000 mg | Freq: Four times a day (QID) | INTRAMUSCULAR | Status: DC | PRN

## 2014-12-21 MED ORDER — CARVEDILOL 3.125 MG PO TABS
3.1250 mg | ORAL_TABLET | Freq: Two times a day (BID) | ORAL | Status: DC
  Administered 2014-12-21: 3.125 mg via ORAL
  Filled 2014-12-21 (×2): qty 1

## 2014-12-21 MED ORDER — SPIRONOLACTONE 25 MG PO TABS
12.5000 mg | ORAL_TABLET | Freq: Every day | ORAL | Status: DC
  Administered 2014-12-22 – 2014-12-23 (×2): 12.5 mg via ORAL
  Filled 2014-12-21 (×3): qty 1

## 2014-12-21 MED ORDER — INFLUENZA VAC SPLIT QUAD 0.5 ML IM SUSY
0.5000 mL | PREFILLED_SYRINGE | INTRAMUSCULAR | Status: DC
  Filled 2014-12-21: qty 0.5

## 2014-12-21 MED ORDER — MORPHINE SULFATE (PF) 2 MG/ML IV SOLN
1.0000 mg | INTRAVENOUS | Status: DC | PRN
  Administered 2014-12-24: 2 mg via INTRAVENOUS
  Filled 2014-12-21 (×2): qty 1

## 2014-12-21 MED ORDER — LISINOPRIL 5 MG PO TABS
5.0000 mg | ORAL_TABLET | Freq: Two times a day (BID) | ORAL | Status: DC
  Administered 2014-12-21 – 2014-12-23 (×5): 5 mg via ORAL
  Filled 2014-12-21 (×6): qty 1

## 2014-12-21 MED ORDER — ENOXAPARIN SODIUM 40 MG/0.4ML ~~LOC~~ SOLN
40.0000 mg | SUBCUTANEOUS | Status: DC
  Administered 2014-12-21 – 2014-12-23 (×3): 40 mg via SUBCUTANEOUS
  Filled 2014-12-21 (×3): qty 0.4

## 2014-12-21 MED ORDER — ASPIRIN EC 81 MG PO TBEC
81.0000 mg | DELAYED_RELEASE_TABLET | Freq: Every day | ORAL | Status: DC
  Administered 2014-12-22 – 2014-12-23 (×2): 81 mg via ORAL
  Filled 2014-12-21 (×3): qty 1

## 2014-12-21 NOTE — ED Notes (Signed)
Per EMS, patient woke up around 1am with sudden onset of chest pain mid chest that extended to back. Took 4 baby aspirin and 2 nitro prior to arrival. Did experience nausea and became diaphoretic. Hx of HTN and MI. 18g placed in left A/C by ems. bp 119/74, p 73, 12 lead unremarkable with ems. Patient reports having chest pain 5/10 on arrival.

## 2014-12-21 NOTE — Consult Note (Signed)
CARDIOLOGY CONSULT NOTE      Patient ID: Chad Fitzgerald MRN: [id number] DOB/AGE: May 03, 1990 24 y.o.  Admit date: 12/21/2014 Referring PhysicianDuncan Orma Flaming, MD Primary PhysicianNo primary care provider on file. Primary Cardiologist Dr. Allyson Sabal Reason for Consultation chest discomfort  HPI: 24 year old man who has a history of an anterolateral ST elevation MI in April 2016. He had a drug-eluting stent placed to his LAD. He has had reduced ejection fraction since that time. Overall, he had been doing well. He had not been following up regularly with Dr. but he had been taking his medicines. He reports that he felt some mild discomfort in his chest, that woke him up from sleep. It was not severe like what he had at the time of his MI but the character of it was similar. He is physically active. He has not had any problems with exertional angina. He denies any sweating or nausea.  While in the hospital, he has had occasional twinges of discomfort. Nothing prolonged. His troponins have been negative.  Review of systems complete and found to be negative unless listed above   Past Medical History  Diagnosis Date  . ADD (attention deficit disorder)   . CAD (coronary artery disease)     a. 04/28/14: acute anterolateral STEMI s/p DES to LAD  . Ischemic cardiomyopathy     a. 04/29/13: 2D ECHO: LVEF 35-40%  . Low serum HDL   . Hyperglycemia     a. 04/2014: Hg A1c 5.3  . Asthma     as a child  . Hypertension   . MI (myocardial infarction) (HCC)     Family History  Problem Relation Age of Onset  . Heart attack Neg Hx   . Stroke Neg Hx   . Hypertension Father     Social History   Social History  . Marital Status: Single    Spouse Name: N/A  . Number of Children: N/A  . Years of Education: N/A   Occupational History  . Not on file.   Social History Main Topics  . Smoking status: Never Smoker   . Smokeless tobacco: Not on file  . Alcohol Use: No  . Drug Use: 2.00 per  week    Special: Marijuana  . Sexual Activity: Not on file   Other Topics Concern  . Not on file   Social History Narrative    Past Surgical History  Procedure Laterality Date  . Appendectomy    . Left heart cath N/A 04/28/2014    Procedure: LEFT HEART CATH;  Surgeon: Runell Gess, MD;  [facility] C[facility] LAB;  Service: Cardiovascular;  Laterality: N/A;     Prescriptions prior to admission  Medication Sig Dispense Refill Last Dose  . aspirin EC 81 MG EC tablet Take 1 tablet (81 mg total) by mouth daily.   12/20/2014 at Unknown time  . atorvastatin (LIPITOR) 80 MG tablet Take 1 tablet (80 mg total) by mouth daily at 6 PM. 30 tablet 11 12/19/2014 at Unknown time  . carvedilol (COREG) 3.125 MG tablet Take 1 tablet (3.125 mg total) by mouth 2 (two) times daily with a meal. 60 tablet 11 12/20/2014 at 1800  . lisinopril (PRINIVIL,ZESTRIL) 5 MG tablet Take 1 tablet (5 mg total) by mouth 2 (two) times daily. 60 tablet 11 12/20/2014 at Unknown time  . nitroGLYCERIN (NITROSTAT) 0.4 MG SL tablet Place 1 tablet (0.4 mg total) under the tongue every 5 (five) minutes as needed for chest pain. 25 tablet 12 unknown  .  spironolactone (ALDACTONE) 25 MG tablet Take 0.5 tablets (12.5 mg total) by mouth daily. 30 tablet 11 12/20/2014 at Unknown time  . ticagrelor (BRILINTA) 90 MG TABS tablet Take 1 tablet (90 mg total) by mouth 2 (two) times daily. 60 tablet 11 12/20/2014 at Unknown time  . ondansetron (ZOFRAN ODT) 4 MG disintegrating tablet Take 1 tablet (4 mg total) by mouth every 8 (eight) hours as needed for nausea or vomiting. (Patient not taking: Reported on 12/21/2014) 10 tablet 0 Not Taking at Unknown time  . oxyCODONE-acetaminophen (PERCOCET/ROXICET) 5-325 MG per tablet Take 1-2 tablets by mouth every 6 (six) hours as needed for severe pain. (Patient not taking: Reported on 12/21/2014) 15 tablet 0 Completed Course at Unknown time  . tamsulosin (FLOMAX) 0.4 MG CAPS capsule Take 1 capsule (0.4 mg  total) by mouth daily. (Patient not taking: Reported on 12/21/2014) 7 capsule 0 Not Taking at Unknown time    Physical Exam: Vitals:   Filed Vitals:   12/21/14 0636 12/21/14 0645 12/21/14 0813 12/21/14 1431  BP: 103/65 95/63 94/71  102/46  Pulse: 56 57 73 72  Temp: 97.8 F (36.6 C)  97.4 F (36.3 C)   TempSrc: Oral  Oral   Resp: Height:    (1.753 m)   Weight:   160 lb 3.2 oz (72.666 kg)   SpO2: 97% 97% 100%    I&O's:  No intake or output data in the 24 hours ending 12/21/14 1558 Physical exam:  [place]/AT EOMI No JVD, No carotid bruit RRR S1S2  No wheezing Soft. NT, nondistended No edema. No focal motor or sensory deficits Normal affect  Labs:   Lab Results  Component Value Date   WBC 9.6 12/21/2014   HGB 16.4 12/21/2014   HCT 46.2 12/21/2014   MCV 85.1 12/21/2014   PLT 307 12/21/2014    Recent Labs Lab 12/21/14 0520  NA 138  K 4.1  CL 107  CO2 22  BUN 12  CREATININE 0.90  CALCIUM 9.5  GLUCOSE 97   Lab Results  Component Value Date   TROPONINI <0.03 12/21/2014    Lab Results  Component Value Date   CHOL 119 12/21/2014   CHOL 110 04/29/2014   Lab Results  Component Value Date   HDL 40* 12/21/2014   HDL 33* 04/29/2014   Lab Results  Component Value Date   LDLCALC 74 12/21/2014   LDLCALC 58 04/29/2014   Lab Results  Component Value Date   TRIG 27 12/21/2014   TRIG 95 04/29/2014   Lab Results  Component Value Date   CHOLHDL 3.0 12/21/2014   CHOLHDL 3.3 04/29/2014   No results found for: LDLDIRECT    Radiology: No pulmonary edema. No infiltrates. EKG: Normal sinus rhythm, septal Q waves  ASSESSMENT AND PLAN:  Principal Problem:   Chest pain Active Problems:   Ischemic cardiomyopathy   CAD S/P LAD PCI  Young man with prior anterior infarct and drug-eluting stent placed in April 2016. He now has recurrent chest discomfort with some features which are similar to his prior MI and some features which are different. ECG shows  evidence of septal MI but no acute ST segment changes.  We'll plan for exercise nuclear stress test in the morning to evaluate for ischemia. I suspect there will be evidence of old anterior MI. We'll have to look for new reversible ischemia as well as see whether he had symptoms with high level exertion.  Discontinue Coreg for the time  being before his stress test. Would restart it at discharge.  Signed:   Fredric Mare, MD, [facility] 12/21/2014, 3:58 PM

## 2014-12-21 NOTE — ED Notes (Signed)
Kelly, PA-C, at the bedside.  

## 2014-12-21 NOTE — Care Management Note (Addendum)
Case Management Note  Patient Details  Name: Chad Fitzgerald MRN: [id number] Date of Birth: 1990/11/22  Subjective/Objective:   Pt admitted for  Chest pain. Previously on Brilinta. Pt is receiving medication via the mail.                 Action/Plan: CM did speak with pt in regards to disposition needs and PCP. Previous admit this CM had set pt up with the [facility]. Per pt he is not gong to clinic and is using [facility] for Pharmacy. CM did reach out to [facility] Liaison Peterson [place] and she will see if an appointment can be made a the clinic. Pharmacy is onsite with medication ranging from $4.00- $10.00. No further needs at this time.    Expected Discharge Date:                  Expected Discharge Plan:  Home/Self Care  In-House Referral:  NA  Discharge planning Services  CM Consult, Indigent Health Clinic, Medication Assistance  Post Acute Care Choice:  NA Choice offered to:  NA  DME Arranged:  N/A DME Agency:  NA  HH Arranged:  NA HH Agency:  NA  Status of Service:  Completed, signed off  Medicare Important Message Given:    Date Medicare IM Given:    Medicare IM give by:    Date Additional Medicare IM Given:    Additional Medicare Important Message give by:     If discussed at Long Length of Stay Meetings, dates discussed:    Additional Comments:  Gala Lewandowsky, RN 12/21/2014, 10:49 AM

## 2014-12-21 NOTE — H&P (Signed)
Date: 12/21/2014               Patient Name:  Chad Fitzgerald MRN: [id number]  DOB: 1990/06/09 Age / Sex: 24 y.o., male   PCP: No primary care provider on file.           Medical Service: Internal Medicine Teaching Service         Attending Physician: Dr. Tyson Alias, MD    First Contact: Dr. Karma Greaser, MD Pager: [phone]  Second Contact: Dr. Isabella Bowens, MD Pager: [phone]       After Hours (After 5p/  First Contact Pager: [phone]  weekends / holidays): Second Contact Pager: [phone]    Most Recent Discharge Date:  07/06/14  Chief Complaint:  Chief Complaint  Patient presents with  . Chest Pain       History of Present Illness:  Chad Fitzgerald is a 24 y.o. male   89 YOM with PMH of ADHD (previously on adderall), STEMI with thrombectomy/DES to LAD (04/2014) presents to ED with chest pain.  He had ischemic cardiomyopathy with EF of 35-40% (echo 04/2014) at last admission, but repeat echo at [facility] in September 2016 revealed an LVEF of 50-55%.  No other significant history but did undergo a hypercoag w/u which revealed a prothrombin gene mutation.  Anticardiolipin ab was insufficient for analysis.  He was started on ASA, brilinta, statin,  carvedilol, lisinopril which he reports compliance.  He has been lost to follow up with cardiology (LOV 05/2011).   Today, he reports substernal chest pain that began around 1AM that woke him up out of bed.  States the pain radiated to his back.  Also endorses associated SOB, lightheadedness, and diaphoresis.  Denies N/V.  States the pain was constant but would increase in intensity at times.  States it felt like the same pain he had previously with his MI in April.  He took 3 ASA around 3AM when the pain did not subside and called EMS.  EKG without acute changes.  I-stat trop neg.  He was given toradol and ativan in the ED with some relief of his chest pain.  He has occasional GERD but takes no medications.  Of note, he does report drinking more  coffee and being under more stress with final exams approaching.  He reports current chest pain 3/10.  He endorses marijuana use but denies any other recreational drugs use.  However, according to [facility] records, he endorses occasional cocaine use.  Drinks alcohol on occasion and used to smoke a couple of cigarettes per week when with friends.  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     Meds: Current Facility-Administered Medications  Medication Dose Route Frequency Provider Last Rate Last Dose  . morphine 4 MG/ML injection 4 mg  4 mg Intravenous Once PRN Antony Madura, PA-C       Current Outpatient Prescriptions  Medication Sig Dispense Refill  . aspirin EC 81 MG EC tablet Take 1 tablet (81 mg total) by mouth daily.    Marland Kitchen atorvastatin (LIPITOR) 80 MG tablet Take 1 tablet (80 mg total) by mouth daily at 6 PM. 30 tablet 11  . carvedilol (COREG) 3.125 MG tablet Take 1 tablet (3.125 mg total) by mouth 2 (two) times daily with a meal. 60 tablet 11  . lisinopril (PRINIVIL,ZESTRIL) 5 MG tablet Take 1 tablet (5 mg total) by mouth 2 (two) times daily. 60 tablet 11  . nitroGLYCERIN (NITROSTAT) 0.4 MG  SL tablet Place 1 tablet (0.4 mg total) under the tongue every 5 (five) minutes as needed for chest pain. 25 tablet 12  . spironolactone (ALDACTONE) 25 MG tablet Take 0.5 tablets (12.5 mg total) by mouth daily. 30 tablet 11  . ticagrelor (BRILINTA) 90 MG TABS tablet Take 1 tablet (90 mg total) by mouth 2 (two) times daily. 60 tablet 11  . ondansetron (ZOFRAN ODT) 4 MG disintegrating tablet Take 1 tablet (4 mg total) by mouth every 8 (eight) hours as needed for nausea or vomiting. (Patient not taking: Reported on 12/21/2014) 10 tablet 0  . oxyCODONE-acetaminophen (PERCOCET/ROXICET) 5-325 MG per tablet Take 1-2 tablets by mouth every 6 (six) hours as needed for severe pain. (Patient not taking: Reported on 12/21/2014) 15 tablet 0  . tamsulosin (FLOMAX) 0.4 MG CAPS capsule Take 1  capsule (0.4 mg total) by mouth daily. (Patient not taking: Reported on 12/21/2014) 7 capsule 0     (Not in a hospital admission)  Allergies: Allergies as of 12/21/2014 - Review Complete 12/21/2014  Allergen Reaction Noted  . Bee pollen Hives, Itching, and Rash 04/28/2014    PMH: Past Medical History  Diagnosis Date  . ADD (attention deficit disorder)   . CAD (coronary artery disease)     a. 04/28/14: acute anterolateral STEMI s/p DES to LAD  . Ischemic cardiomyopathy     a. 04/29/13: 2D ECHO: LVEF 35-40%  . Low serum HDL   . Hyperglycemia     a. 04/2014: Hg A1c 5.3  . Asthma     as a child  . Hypertension   . MI (myocardial infarction) (HCC)     PSH: Past Surgical History  Procedure Laterality Date  . Appendectomy    . Left heart cath N/A 04/28/2014    Procedure: LEFT HEART CATH;  Surgeon: Runell Gess, MD;  Location: [facility] CATH LAB;  Service: Cardiovascular;  Laterality: N/A;    FH: Family History  Problem Relation Age of Onset  . Heart attack Neg Hx   . Stroke Neg Hx   . Hypertension Father     SH: Social History  Substance Use Topics  . Smoking status: Never Smoker   . Smokeless tobacco: Not on file  . Alcohol Use: No    Review of Systems: Pertinent items are noted in HPI.  Physical Exam: BP 95/63 mmHg  Pulse 57  Temp(Src) 97.8 F (36.6 C) (Oral)  Resp 15  Ht  (1.753 m)  Wt 73.483 kg (162 lb)  BMI 23.91 kg/m2  SpO2 97%  Physical Exam  Constitutional: Vital signs reviewed.  Patient is a well-developed and well-nourished male in no acute distress and cooperative with exam.  Head: Normocephalic and atraumatic Eyes: EOMI, conjunctivae normal. Neck: Supple, Trachea midline .  Cardiovascular: RRR, no MRG, pulses symmetric and intact bilaterally Pulmonary/Chest: normal respiratory effort, CTAB, no wheezes, rales, or rhonchi Abdominal: Soft. Non-tender, non-distended, bowel sounds are normal.   Neurological: A&O x3, cranial nerve II-XII are  grossly intact, moving all extremities.  Skin: Warm, dry and intact. No rash, cyanosis, or clubbing.  Psychiatric: Normal mood and affect.    Lab results:  Basic Metabolic Panel:  Recent Labs  16/10/96 0520  NA 138  K 4.1  CL 107  CO2 22  GLUCOSE 97  BUN 12  CREATININE 0.90  CALCIUM 9.5    Calcium/Magnesium/Phosphorus:  Recent Labs Lab 12/21/14 0520  CALCIUM 9.5    Liver Function Tests: No results for input(s): AST, ALT, ALKPHOS, BILITOT,  PROT, ALBUMIN in the last 72 hours. No results for input(s): LIPASE, AMYLASE in the last 72 hours. No results for input(s): AMMONIA in the last 72 hours.  CBC: Lab Results  Component Value Date   WBC 9.6 12/21/2014   HGB 16.4 12/21/2014   HCT 46.2 12/21/2014   MCV 85.1 12/21/2014   PLT 307 12/21/2014    Lipase: Lab Results  Component Value Date   LIPASE 28 07/06/2014    Lactic Acid/Procalcitonin: No results for input(s): LATICACIDVEN, PROCALCITON, O2SATVEN in the last 168 hours.  Cardiac Enzymes:  Recent Labs  12/21/14 0447  TROPIPOC 0.01   Lab Results  Component Value Date   TROPONINI <0.03 12/21/2014    BNP: No results for input(s): PROBNP in the last 72 hours.  D-Dimer: No results for input(s): DDIMER in the last 72 hours.  CBG: No results for input(s): GLUCAP in the last 72 hours.  Hemoglobin A1C: No results for input(s): HGBA1C in the last 72 hours.  Lipid Panel: No results for input(s): CHOL, HDL, LDLCALC, TRIG, CHOLHDL, LDLDIRECT in the last 72 hours.  Thyroid Function Tests: No results for input(s): TSH, T4TOTAL, FREET4, T3FREE, THYROIDAB in the last 72 hours.  Anemia Panel: No results for input(s): VITAMINB12, FOLATE, FERRITIN, TIBC, IRON, RETICCTPCT in the last 72 hours.  Coagulation: No results for input(s): LABPROT, INR in the last 72 hours.  Urine Drug Screen: Drugs of Abuse:  No results found for: LABOPIA, COCAINSCRNUR, LABBENZ, AMPHETMU, THCU, LABBARB  Alcohol Level: No  results for input(s): ETH in the last 72 hours.  Urinalysis:    Component Value Date/Time   COLORURINE RED* 07/06/2014 1656   APPEARANCEUR TURBID* 07/06/2014 1656   LABSPEC 1.027 07/06/2014 1656   PHURINE 5.0 07/06/2014 1656   GLUCOSEU NEGATIVE 07/06/2014 1656   HGBUR LARGE* 07/06/2014 1656   BILIRUBINUR LARGE* 07/06/2014 1656   KETONESUR 40* 07/06/2014 1656   PROTEINUR 100* 07/06/2014 1656   UROBILINOGEN 1.0 07/06/2014 1656   NITRITE POSITIVE* 07/06/2014 1656   LEUKOCYTESUR MODERATE* 07/06/2014 1656    Imaging results:  Dg Chest 2 View  12/21/2014  CLINICAL DATA:  Acute onset of generalized chest pain. Initial encounter. EXAM: CHEST  2 VIEW COMPARISON:  Chest radiograph performed 04/28/2014 FINDINGS: The lungs are well-aerated and clear. There is no evidence of focal opacification, pleural effusion or pneumothorax. The heart is normal in size; the mediastinal contour is within normal limits. No acute osseous abnormalities are seen. IMPRESSION: No acute cardiopulmonary process seen. Electronically Signed   By: Roanna Raider M.D.   On: 12/21/2014 05:35    EKG: EKG Interpretation  Date/Time:  Friday December 21 2014 04:46:26 EST Ventricular Rate:  66 PR Interval:  149 QRS Duration: 95 QT Interval:  381 QTC Calculation: 399 R Axis:   79 Text Interpretation:  Sinus arrhythmia Anterior infarct, old Baseline wander in lead(s) V5 No significant change since last tracing July 2016 Confirmed by Elesa Massed,  DO, KRISTEN [phone]) on 12/21/2014 4:50:59 AM   Antibiotics: Antibiotics Given (last 72 hours)    None      Anti-infectives    None     Consults:     Assessment & Plan by Problem: Principal Problem:   Chest pain Active Problems:   Ischemic cardiomyopathy   CAD S/P LAD PCI  Chest pain -admit to telemetry  -troponins x 3 -morphine & NTG prn -lipid panel, HA1c, UDS -EKG in AM and prn chest pain -consult cardiology -will repeat antiphospolipid abs -GI  cocktail  CAD  s/p thrombectomy/DES to LAD 04/2014 -plans per above   Ischemic cardiomyopathy 04/2014 echo: LVEF 35-40%, with repeat 09/2014 EF 50-55%.   -repeat echo   Prothrombin gene mutation  Disposition Disposition deferred at this time, awaiting improvement of current medical problems. Anticipated discharge in approximately 1-2 day(s).     Emergency Contact Contact Information    Name Relation Home Work Mobile   Sosa,Maro Brother [phone]        The patient does not have a current PCP (No primary care provider on file.) and does need an [facility] hospital follow-up appointment after discharge.  Signed Marrian Salvage, MD PGY-3, Internal Medicine Teaching Service 12/21/2014, 8:06 AM

## 2014-12-21 NOTE — Progress Notes (Signed)
  Echocardiogram 2D Echocardiogram has been performed.  Chad Fitzgerald 12/21/2014, 12:07 PM

## 2014-12-21 NOTE — Care Management (Signed)
[facility]:  This Case Manager received call from Tomi Bamberger indicating patient needing hospital follow-up appointment. Patient is uninsured and does not have a PCP.  Appointment obtained at [facility] on 01/04/15 at 1115 for establishment of care with a primary care provider. AVS updated. If needed, patient able to use [facility] pharmacy for medications. Tomi Bamberger, RN CM updated.

## 2014-12-21 NOTE — ED Notes (Signed)
Attempted to call report. Left call back number, offered to give report and delay transfer.

## 2014-12-21 NOTE — ED Provider Notes (Signed)
CSN: [id number]     Arrival date & time 12/21/14  4098 History   First MD Initiated Contact with Patient 12/21/14 [phone]     Chief Complaint  Patient presents with  . Chest Pain     (Consider location/radiation/quality/duration/timing/severity/associated sxs/prior Treatment) HPI Comments: 24 y/o male with a hx of STEMI s/p PCI in April 2016, CAD, ICM (LVEF 35-40%), HTN, asthma, and ADD presents to the ED for evaluation of chest pain. Patient reports sudden onset of chest pain slight left of his sternum, waking him from sleep at 0100. He reports progressive worsening of his pain with radiation to his R lower scapula as well as his back. Patient also reports mild lightheadedness, diaphoresis, SOB, and nausea. He states that his symptoms improved with SL NTG x 2. Pain now 4/10. He also took 4 tablets of  ASA. He feels like his symptoms feel similar to his past MI. He also reports feeling very anxious. He denies illicit drug use. He does smoke marijuana. He has neglected to f/u with a cardiologist on an outpatient basis because "things are busy with school". He has been compliant with his medications including daily baby ASA and Brilinta. No associated fever, syncope, vomiting, or abdominal pain. No extremity numbness or weakness.  Patient is a 24 y.o. male presenting with chest pain. The history is provided by the patient. No language interpreter was used.  Chest Pain Associated symptoms: diaphoresis, nausea and shortness of breath   Associated symptoms: no abdominal pain, no fever, no numbness, not vomiting and no weakness     Past Medical History  Diagnosis Date  . ADD (attention deficit disorder)   . CAD (coronary artery disease)     a. 04/28/14: acute anterolateral STEMI s/p DES to LAD  . Ischemic cardiomyopathy     a. 04/29/13: 2D ECHO: LVEF 35-40%  . Low serum HDL   . Hyperglycemia     a. 04/2014: Hg A1c 5.3  . Asthma     as a child  . Hypertension   . MI (myocardial infarction)  [facility])    Past Surgical History  Procedure Laterality Date  . Appendectomy    . Left heart cath N/A 04/28/2014    Procedure: LEFT HEART CATH;  Surgeon: Runell Gess, MD;  Location: [facility] CATH LAB;  Service: Cardiovascular;  Laterality: N/A;   Family History  Problem Relation Age of Onset  . Heart attack Neg Hx   . Stroke Neg Hx   . Hypertension Father    Social History  Substance Use Topics  . Smoking status: Never Smoker   . Smokeless tobacco: None  . Alcohol Use: No    Review of Systems  Constitutional: Positive for diaphoresis. Negative for fever.  Respiratory: Positive for shortness of breath.   Cardiovascular: Positive for chest pain.  Gastrointestinal: Positive for nausea. Negative for vomiting and abdominal pain.  Neurological: Positive for light-headedness. Negative for syncope, weakness and numbness.  Psychiatric/Behavioral: The patient is nervous/anxious.   All other systems reviewed and are negative.   Allergies  Bee pollen  Home Medications   Prior to Admission medications   Medication Sig Start Date End Date Taking? Authorizing Provider  aspirin EC 81 MG EC tablet Take 1 tablet (81 mg total) by mouth daily. 05/01/14  Yes Janetta Hora, PA-C  atorvastatin (LIPITOR) 80 MG tablet Take 1 tablet (80 mg total) by mouth daily at 6 PM. 05/01/14  Yes Janetta Hora, PA-C  carvedilol (COREG) 3.125 MG tablet Take  1 tablet (3.125 mg total) by mouth 2 (two) times daily with a meal. 05/01/14  Yes Janetta Hora, PA-C  lisinopril (PRINIVIL,ZESTRIL) 5 MG tablet Take 1 tablet (5 mg total) by mouth 2 (two) times daily. 05/01/14  Yes Janetta Hora, PA-C  nitroGLYCERIN (NITROSTAT) 0.4 MG SL tablet Place 1 tablet (0.4 mg total) under the tongue every 5 (five) minutes as needed for chest pain. 05/01/14  Yes Janetta Hora, PA-C  spironolactone (ALDACTONE) 25 MG tablet Take 0.5 tablets (12.5 mg total) by mouth daily. 05/01/14  Yes Janetta Hora, PA-C  ticagrelor  (BRILINTA) 90 MG TABS tablet Take 1 tablet (90 mg total) by mouth 2 (two) times daily. 05/01/14  Yes Janetta Hora, PA-C  ondansetron (ZOFRAN ODT) 4 MG disintegrating tablet Take 1 tablet (4 mg total) by mouth every 8 (eight) hours as needed for nausea or vomiting. Patient not taking: Reported on 12/21/2014 07/06/14   Renne Crigler, PA-C  oxyCODONE-acetaminophen (PERCOCET/ROXICET) 5-325 MG per tablet Take 1-2 tablets by mouth every 6 (six) hours as needed for severe pain. Patient not taking: Reported on 12/21/2014 07/06/14   Renne Crigler, PA-C  tamsulosin (FLOMAX) 0.4 MG CAPS capsule Take 1 capsule (0.4 mg total) by mouth daily. Patient not taking: Reported on 12/21/2014 07/06/14   Renne Crigler, PA-C   BP 100/62 mmHg  Pulse 74  Temp(Src) 98 F (36.7 C)  Resp 16  Ht  (1.753 m)  Wt 73.483 kg  BMI 23.91 kg/m2  SpO2 98%   Physical Exam  Constitutional: He is oriented to person, place, and time. He appears well-developed and well-nourished. No distress.  Nontoxic/nonseptic appearing  HENT:  Head: Normocephalic and atraumatic.  Eyes: Conjunctivae and EOM are normal. No scleral icterus.  Neck: Normal range of motion.  Cardiovascular: Normal rate, regular rhythm and intact distal pulses.   Pulmonary/Chest: Effort normal and breath sounds normal. No respiratory distress. He has no wheezes. He has no rales.  Respirations even and unlabored  Abdominal: Soft. He exhibits no distension. There is no tenderness. There is no rebound.  Soft, nontender  Musculoskeletal: Normal range of motion.  No pitting edema.  Neurological: He is alert and oriented to person, place, and time. He exhibits normal muscle tone. Coordination normal.  GCS 15. Speech is goal oriented. Patient moving all extremities.  Skin: Skin is warm and dry. No rash noted. He is not diaphoretic. No erythema.  Psychiatric: His behavior is normal. His mood appears anxious.  Nursing note and vitals reviewed.   ED Course   Procedures (including critical care time) Labs Review Labs Reviewed  TROPONIN I  CBC WITH DIFFERENTIAL/PLATELET  BASIC METABOLIC PANEL  Rosezena Sensor, ED    Imaging Review Dg Chest 2 View  12/21/2014  CLINICAL DATA:  Acute onset of generalized chest pain. Initial encounter. EXAM: CHEST  2 VIEW COMPARISON:  Chest radiograph performed 04/28/2014 FINDINGS: The lungs are well-aerated and clear. There is no evidence of focal opacification, pleural effusion or pneumothorax. The heart is normal in size; the mediastinal contour is within normal limits. No acute osseous abnormalities are seen. IMPRESSION: No acute cardiopulmonary process seen. Electronically Signed   By: Roanna Raider M.D.   On: 12/21/2014 05:35     I have personally reviewed and evaluated these images and lab results as part of my medical decision-making.   EKG Interpretation   Date/Time:  Friday December 21 2014 04:46:26 EST Ventricular Rate:  66 PR Interval:  149 QRS Duration: 95 QT  Interval:  381 QTC Calculation: 399 R Axis:   79 Text Interpretation:  Sinus arrhythmia Anterior infarct, old Baseline  wander in lead(s) V5 No significant change since last tracing July 2016  Confirmed by Elesa Massed,  DO, KRISTEN [phone]) on 12/21/2014 4:50:59 AM      MDM   Final diagnoses:  Chest pain, unspecified chest pain type    24 year old male presents to the emergency department for chest pain which woke him from sleep at 1 AM. He reports that his pain feels similar to his MI which she experienced in April 2016. He is status post PCI to his LAD. He reports compliance with his medications. Initial cardiac workup has been reassuring. EKG is nonischemic. Given his recent and concerning cardiac history, will admit for chest pain rule out. Case discussed with internal medicine teaching service who will evaluate the patient for admission. Temporary admission orders placed; VSS.   Filed Vitals:   12/21/14 0447 12/21/14 0505 12/21/14  0537 12/21/14 0545  BP:  100/60 106/71 100/62  Pulse:  61 62 74  Temp:      Resp:  13 15 16   Height: 5\' 9"  (1.753 m)     Weight: 73.483 kg     SpO2:  99% 96% 98%     Antony Madura, PA-C 12/21/14 0626  Layla Maw Ward, DO 12/21/14 [phone]

## 2014-12-21 NOTE — ED Notes (Signed)
Patient transported to X-ray 

## 2014-12-22 ENCOUNTER — Observation Stay (HOSPITAL_COMMUNITY): Payer: MEDICAID

## 2014-12-22 ENCOUNTER — Observation Stay (HOSPITAL_COMMUNITY): Payer: Self-pay

## 2014-12-22 DIAGNOSIS — I25119 Atherosclerotic heart disease of native coronary artery with unspecified angina pectoris: Secondary | ICD-10-CM

## 2014-12-22 DIAGNOSIS — R079 Chest pain, unspecified: Secondary | ICD-10-CM

## 2014-12-22 DIAGNOSIS — I209 Angina pectoris, unspecified: Secondary | ICD-10-CM

## 2014-12-22 LAB — BASIC METABOLIC PANEL
ANION GAP: 7 (ref 5–15)
BUN: 12 mg/dL (ref 6–20)
CALCIUM: 9.1 mg/dL (ref 8.9–10.3)
CHLORIDE: 106 mmol/L (ref 101–111)
CO2: 24 mmol/L (ref 22–32)
Creatinine, Ser: 0.88 mg/dL (ref 0.61–1.24)
GFR calc non Af Amer: >60 mL/min (ref >60)
Glucose, Bld: 91 mg/dL (ref 65–99)
Potassium: 4.2 mmol/L (ref 3.5–5.1)
Sodium: 137 mmol/L (ref 135–145)

## 2014-12-22 LAB — HEMOGLOBIN A1C
Hgb A1c MFr Bld: 5.8 % — ABNORMAL HIGH (ref 4.8–5.6)
MEAN PLASMA GLUCOSE: 120 mg/dL

## 2014-12-22 MED ORDER — TECHNETIUM TC 99M SESTAMIBI GENERIC - CARDIOLITE
10.0000 | Freq: Once | INTRAVENOUS | Status: AC | PRN
  Administered 2014-12-22: 10 via INTRAVENOUS

## 2014-12-22 MED ORDER — TECHNETIUM TC 99M SESTAMIBI - CARDIOLITE
30.0000 | Freq: Once | INTRAVENOUS | Status: AC | PRN
  Administered 2014-12-22: 30 via INTRAVENOUS

## 2014-12-22 NOTE — Consult Note (Signed)
Consulting cardiologist: Eldridge Dace Primary Cardiologist: Allyson Sabal  Cardiology Specific Problem List: 1. CAD 2. ICM 3. Hypertension  Subjective:    No chest pain overnight. NPO for stress test.    Objective:   Temp:  [97.6 F (36.4 C)-98.7 F (37.1 C)] 97.6 F (36.4 C) (12/17 0500) Pulse Rate:  [56-74] 56 (12/17 0500) Resp:  [16] 16 (12/17 0500) BP: (97-107)/(46-61) 100/55 mmHg (12/17 0500) SpO2:  [98 %-99 %] 99 % (12/17 0500) Weight:  [160 lb 6.4 oz (72.757 kg)] 160 lb 6.4 oz (72.757 kg) (12/17 0500) Last BM Date: 12/21/14  Filed Weights   12/21/14 0447 12/21/14 0813 12/22/14 0500  Weight: 162 lb (73.483 kg) 160 lb 3.2 oz (72.666 kg) 160 lb 6.4 oz (72.757 kg)    Intake/Output Summary (Last 24 hours) at 12/22/14 0820 Last data filed at 12/22/14 0800  Gross per 24 hour  Intake    600 ml  Output    700 ml  Net   -100 ml    Telemetry: NSR with T-wave abnormality in Lead III.   Exam:  General: No acute distress.  HEENT: Conjunctiva and lids normal, oropharynx clear.  Lungs: Clear to auscultation, nonlabored.  Cardiac: No elevated JVP or bruits. RRR, no gallop or rub.   Abdomen: Normoactive bowel sounds, nontender, nondistended.  Extremities: No pitting edema, distal pulses full.  Neuropsychiatric: Alert and oriented x3, affect appropriate.   Lab Results:  Basic Metabolic Panel:  Recent Labs Lab 12/21/14 0520 12/22/14 0316  NA 138 137  K 4.1 4.2  CL 107 106  CO2 22 24  GLUCOSE 97 91  BUN 12 12  CREATININE 0.90 0.88  CALCIUM 9.5 9.1   CBC:  Recent Labs Lab 12/21/14 0520  WBC 9.6  HGB 16.4  HCT 46.2  MCV 85.1  PLT 307    Cardiac Enzymes:  Recent Labs Lab 12/21/14 0930 12/21/14 1552 12/21/14 2006  TROPONINI <0.03 <0.03 <0.03    Radiology: Dg Chest 2 View  12/21/2014  CLINICAL DATA:  Acute onset of generalized chest pain. Initial encounter. EXAM: CHEST  2 VIEW COMPARISON:  Chest radiograph performed 04/28/2014 FINDINGS: The  lungs are well-aerated and clear. There is no evidence of focal opacification, pleural effusion or pneumothorax. The heart is normal in size; the mediastinal contour is within normal limits. No acute osseous abnormalities are seen. IMPRESSION: No acute cardiopulmonary process seen. Electronically Signed   By: Roanna Raider M.D.   On: 12/21/2014 05:35        Medications:   Scheduled Medications: . aspirin EC  81 mg Oral Daily  . atorvastatin  80 mg Oral q1800  . enoxaparin (LOVENOX) injection  40 mg Subcutaneous Q24H  . Influenza vac split quadrivalent PF  0.5 mL Intramuscular Tomorrow-1000  . lisinopril  5 mg Oral BID  . spironolactone  12.5 mg Oral Daily  . ticagrelor  90 mg Oral BID    Infusions:    PRN Medications: acetaminophen, morphine injection, morphine, nitroGLYCERIN, ondansetron (ZOFRAN) IV   Assessment and Plan:   1. CAD with recurrent chest pain: Troponin is negative with no acute ST T wave changes, arguing against ACS. Plans for stress test today. Hx of DES to LAD. Continue DAPT, statin, ACE. BB on hold for stress test.   2. Hypertension:  Low normal. He has no complaints of dizziness.  3. ICM: Last echo on 12/21/2014 EF of 35%-40%. Carvedilol on hold currently. Still on spironolactone but no diuretics. Will follow. Creatinine 0.88 with potassium  of 4.2  Bettey Mare. Lawrence NP [facility]  12/22/2014, 8:20 AM   The patient was seen, examined and discussed with Joni Reining, NP and I agree with the above.    24 year old with CAD, s/p PCI to LADS in April , admitted with a similar pain, less intense, negative troponin, unchanged ECG, awaiting stress test today, if negative, consider adding ranolazine 500 mg po BID, and sl NTG 0.4 mg. If positive cath on Monday.   Lars Masson 12/22/2014

## 2014-12-22 NOTE — Progress Notes (Signed)
   Subjective: Patient with no complaints this morning. Denies any CP, SOB.   Objective: Vital signs in last 24 hours: Filed Vitals:   12/21/14 2300 12/22/14 0500 12/22/14 0900 12/22/14 1156  BP: 107/61 100/55 112/56 108/68  Pulse:  56  97  Temp:  97.6 F (36.4 C)    TempSrc:  Oral    Resp:  16    Height:      Weight:  160 lb 6.4 oz (72.757 kg)    SpO2:  99%     Weight change: -1 lb 12.8 oz (-0.816 kg)  Intake/Output Summary (Last 24 hours) at 12/22/14 1157 Last data filed at 12/22/14 0800  Gross per 24 hour  Intake    600 ml  Output    700 ml  Net   -100 ml    Physical exam GENERAL- alert, co-operative, appears as stated age, not in any distress. HEENT- Atraumatic, normocephalic CARDIAC- RRR, no murmurs, rubs or gallops. RESP- Moving equal volumes of air, and clear to auscultation bilaterally, no wheezes or crackles. ABDOMEN- Soft, nontender, bowel sounds present. NEURO- No obvious Cr N abnormality. EXTREMITIES- pulse 2+, symmetric, no pedal edema. SKIN- Warm, dry, No rash or lesion. PSYCH- Normal mood and affect, appropriate thought content and speech.  Medications: I have reviewed the patient's current medications. Scheduled Meds: . aspirin EC  81 mg Oral Daily  . atorvastatin  80 mg Oral q1800  . enoxaparin (LOVENOX) injection  40 mg Subcutaneous Q24H  . Influenza vac split quadrivalent PF  0.5 mL Intramuscular Tomorrow-1000  . lisinopril  5 mg Oral BID  . spironolactone  12.5 mg Oral Daily  . ticagrelor  90 mg Oral BID   Continuous Infusions:  PRN Meds:.acetaminophen, morphine injection, morphine, nitroGLYCERIN, ondansetron (ZOFRAN) IV Assessment/Plan:  CAD s/p thrombectomy/DES to LAD 04/2014 with recurrent chest pain: Troponin negative x3. EKG with no acute changes. No events overnight and denies any further chest pain. Holding BB for stress test. Lipid panel unremarkable. UDS negative. A1c 5.8. Evaluated by cardiology and will go for stress test today.  Consider adding ranolazine 500 mg po bid and sl NTG 0.4 mg prn if stress test negative per cardiology. If stress test positive, will go for cath Monday. -Antiphospholipid abs pending  -Continue ASA 81 mg, Lipitor 80 mg, lisinopril 5 mg bid, spironolactone 12.5 mg -Brilinta 90 mg bid -telemetry   Ischemic cardiomyopathy: 04/2014 echo: LVEF 35-40%, with repeat 09/2014 EF 50-55%. echo 12/16 with EF of 35-40%.   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 Would be more consistent with venous thromboembolus than an arterial embolism. Bubble study at [facility] was negative for PFO.  DVT PPx: Lovenox  Dispo: Discharge today pending results of stress test  The patient does not have a current PCP (No primary care provider on file.) and does need an [facility] hospital follow-up appointment after discharge.  The patient does not have transportation limitations that hinder transportation to clinic appointments.  .Services Needed at time of discharge: Y = Yes, Blank = No PT:   OT:   RN:   Equipment:   Other:       Valentino Nose, MD IMTS PGY-1 [phone] 12/22/2014, 11:57 AM

## 2014-12-22 NOTE — Plan of Care (Signed)
Problem: Consults Goal: Chest Pain Patient Education (See Patient Education module for education specifics.)  Outcome: Progressing Pt educated on signs/symptoms of chest pain and medical management of pain. Pt educated in the past from recent MI. Pt has no further questions at this time.

## 2014-12-22 NOTE — Progress Notes (Signed)
Report given to Jessica RN in patient's room using SBAR format, reviewed chart, orders, labs, VS, meds and patient's general condition, transferred care to her in stable condition. 

## 2014-12-22 NOTE — Progress Notes (Signed)
Report received via [place] RN in patient's room using [employer], reviewed chart, orders, labs, VS, tests, meds and patient's general condition, assumed care of patient.

## 2014-12-23 DIAGNOSIS — E785 Hyperlipidemia, unspecified: Secondary | ICD-10-CM

## 2014-12-23 DIAGNOSIS — R7303 Prediabetes: Secondary | ICD-10-CM

## 2014-12-23 LAB — NM MYOCAR MULTI W/SPECT W/WALL MOTION / EF
CHL CUP STRESS STAGE 1 HR: 69 {beats}/min
CHL CUP STRESS STAGE 10 HR: 96 {beats}/min
CHL CUP STRESS STAGE 10 SBP: 145 mmHg
CHL CUP STRESS STAGE 2 GRADE: 0 %
CHL CUP STRESS STAGE 2 HR: 99 {beats}/min
CHL CUP STRESS STAGE 2 SPEED: 0 mph
CHL CUP STRESS STAGE 3 GRADE: 0 %
CHL CUP STRESS STAGE 3 HR: 83 {beats}/min
CHL CUP STRESS STAGE 4 GRADE: 0.1 %
CHL CUP STRESS STAGE 5 GRADE: 10 %
CHL CUP STRESS STAGE 5 HR: 85 {beats}/min
CHL CUP STRESS STAGE 5 SBP: 115 mmHg
CHL CUP STRESS STAGE 6 DBP: 67 mmHg
CHL CUP STRESS STAGE 6 GRADE: 12 %
CHL CUP STRESS STAGE 6 SBP: 121 mmHg
CHL CUP STRESS STAGE 6 SPEED: 2.5 mph
CHL CUP STRESS STAGE 7 DBP: 67 mmHg
CHL CUP STRESS STAGE 7 HR: 136 {beats}/min
CHL CUP STRESS STAGE 7 SBP: 166 mmHg
CHL CUP STRESS STAGE 8 GRADE: 16 %
CHL CUP STRESS STAGE 9 GRADE: 0 %
CHL CUP STRESS STAGE 9 SPEED: 0 mph
CSEPPHR: 157 {beats}/min
CSEPPMHR: 80 %
Estimated workload: 13.4 METS
Stage 1 DBP: 68 mmHg
Stage 1 Grade: 0 %
Stage 1 SBP: 108 mmHg
Stage 1 Speed: 0 mph
Stage 10 DBP: 80 mmHg
Stage 10 Grade: 0 %
Stage 10 Speed: 0 mph
Stage 3 Speed: 0.9 mph
Stage 4 HR: 81 {beats}/min
Stage 4 Speed: 1 mph
Stage 5 DBP: 63 mmHg
Stage 5 Speed: 1.7 mph
Stage 6 HR: 101 {beats}/min
Stage 7 Grade: 14 %
Stage 7 Speed: 3.4 mph
Stage 8 HR: 157 {beats}/min
Stage 8 Speed: 4.2 mph
Stage 9 DBP: 66 mmHg
Stage 9 HR: 131 {beats}/min
Stage 9 SBP: 129 mmHg

## 2014-12-23 LAB — CARDIOLIPIN ANTIBODIES, IGG, IGM, IGA
Anticardiolipin IgA: <9 APL U/mL (ref 0–11)
Anticardiolipin IgM: <9 MPL U/mL (ref 0–12)

## 2014-12-23 LAB — BETA-2-GLYCOPROTEIN I ABS, IGG/M/A

## 2014-12-23 LAB — PROTIME-INR
INR: 1.02 (ref 0.00–1.49)
PROTHROMBIN TIME: 13.6 s (ref 11.6–15.2)

## 2014-12-23 MED ORDER — SODIUM CHLORIDE 0.9 % WEIGHT BASED INFUSION
3.0000 mL/kg/h | INTRAVENOUS | Status: DC
  Administered 2014-12-24: 3 mL/kg/h via INTRAVENOUS

## 2014-12-23 MED ORDER — SODIUM CHLORIDE 0.9 % IJ SOLN
3.0000 mL | Freq: Two times a day (BID) | INTRAMUSCULAR | Status: DC
  Administered 2014-12-23 (×2): 3 mL via INTRAVENOUS

## 2014-12-23 MED ORDER — SODIUM CHLORIDE 0.9 % WEIGHT BASED INFUSION: 1.0000 mL/kg/h | INTRAVENOUS | Status: DC

## 2014-12-23 MED ORDER — SODIUM CHLORIDE 0.9 % IV SOLN: 250.0000 mL | INTRAVENOUS | Status: DC | PRN

## 2014-12-23 MED ORDER — SODIUM CHLORIDE 0.9 % IJ SOLN: 3.0000 mL | INTRAMUSCULAR | Status: DC | PRN

## 2014-12-23 MED ORDER — ASPIRIN 81 MG PO CHEW
81.0000 mg | CHEWABLE_TABLET | ORAL | Status: AC
  Administered 2014-12-24: 81 mg via ORAL
  Filled 2014-12-23: qty 1

## 2014-12-23 NOTE — Progress Notes (Addendum)
Patient Name: Chad Fitzgerald Date of Encounter: 12/23/2014  Principal Problem:   Chest pain Active Problems:   Ischemic cardiomyopathy   CAD S/P LAD PCI   Pain in the chest   Length of Stay:   SUBJECTIVE  Denies chest pain or SOB.   CURRENT MEDS . aspirin EC  81 mg Oral Daily  . atorvastatin  80 mg Oral q1800  . enoxaparin (LOVENOX) injection  40 mg Subcutaneous Q24H  . Influenza vac split quadrivalent PF  0.5 mL Intramuscular Tomorrow-1000  . lisinopril  5 mg Oral BID  . spironolactone  12.5 mg Oral Daily  . ticagrelor  90 mg Oral BID    OBJECTIVE  Filed Vitals:   12/22/14 2015 12/22/14 2215 12/23/14 0500 12/23/14 0800  BP: 120/77 120/74 101/58 113/72  Pulse: 81  75   Temp: 98.7 F (37.1 C)  97.7 F (36.5 C)   TempSrc: Oral  Oral   Resp: 16  18   Height:      Weight:   159 lb (72.122 kg)   SpO2: 100%  100%     Intake/Output Summary (Last 24 hours) at 12/23/14 0914 Last data filed at 12/23/14 0830  Gross per 24 hour  Intake    720 ml  Output    600 ml  Net    120 ml   Filed Weights   12/21/14 0813 12/22/14 0500 12/23/14 0500  Weight: 160 lb 3.2 oz (72.666 kg) 160 lb 6.4 oz (72.757 kg) 159 lb (72.122 kg)    PHYSICAL EXAM  General: Pleasant, NAD. Neuro: Alert and oriented X 3. Moves all extremities spontaneously. Psych: Normal affect. HEENT:  Normal  Neck: Supple without bruits or JVD. Lungs:  Resp regular and unlabored, CTA. Heart: RRR no s3, s4, 2/6 systolic murmur. Abdomen: Soft, non-tender, non-distended, BS + x 4.  Extremities: No clubbing, cyanosis or edema. DP/PT/Radials 2+ and equal bilaterally.  Accessory Clinical Findings  CBC  Recent Labs  12/21/14 0520  WBC 9.6  NEUTROABS 5.0  HGB 16.4  HCT 46.2  MCV 85.1  PLT 307   Basic Metabolic Panel  Recent Labs  12/21/14 0520 12/22/14 0316  NA 138 137  K 4.1 4.2  CL 107 106  CO2 22 24  GLUCOSE 97 91  BUN 12 12  CREATININE 0.90 0.88  CALCIUM 9.5 9.1   Cardiac  Enzymes  Recent Labs  12/21/14 0930 12/21/14 1552 12/21/14 2006  TROPONINI <0.03 <0.03 <0.03   BNP Invalid input(s): POCBNP D-Dimer No results for input(s): DDIMER in the last 72 hours. Hemoglobin A1C  Recent Labs  12/21/14 0803  HGBA1C 5.8*   Fasting Lipid Panel  Recent Labs  12/21/14 0804  CHOL 119  HDL 40*  LDLCALC 74  TRIG 27  CHOLHDL 3.0   Thyroid Function Tests No results for input(s): TSH, T4TOTAL, T3FREE, THYROIDAB in the last 72 hours.  Invalid input(s): FREET3  Radiology/Studies  Dg Chest 2 View  12/21/2014  CLINICAL DATA:  Acute onset of generalized chest pain. Initial encounter. EXAM: CHEST  2 VIEW COMPARISON:  Chest radiograph performed 04/28/2014 FINDINGS: The lungs are well-aerated and clear. There is no evidence of focal opacification, pleural effusion or pneumothorax. The heart is normal in size; the mediastinal contour is within normal limits. No acute osseous abnormalities are seen. IMPRESSION: No acute cardiopulmonary process seen. Electronically Signed   By: Roanna Raider M.D.   On: 12/21/2014 05:35   Nm Myocar Multi W/spect W/wall Motion / Ef  12/22/2014  CLINICAL DATA:  Coronary disease post MI, hypertension, ischemic cardiomyopathy, prior catheterization 04/28/2014  IMPRESSION: 1. Large area of non reversible decreased myocardial perfusion involving the anteroseptal and apical walls of the LEFT ventricle extending slightly to the inferoapical wall. 2. Global hypokinesia with dyskinesia of the apex. 3. Left ventricular ejection fraction 35% 4. Intermediate-risk stress test findings*.   TELE: SR   ASSESSMENT AND PLAN  1. CAD with recurrent chest pain: Troponin is negative with no acute ST T wave changes, arguing against ACS. Plans for stress test today. Hx of DES to LAD. Continue DAPT, statin, ACE. BB on hold for stress test.   2. Hypertension: Low normal. He has no complaints of dizziness.  3. ICM: Last echo on 12/21/2014 EF of 35%-40%.  Carvedilol on hold currently. Still on spironolactone but no diuretics. Will follow. Creatinine 0.88 with potassium of 82.73  24 year old with CAD, s/p PCI to LADS in April , admitted with a similar pain, less intense, negative troponin, unchanged ECG, his stress test shows a large scar in the LAD territory, however there is periinfarct ischemia. Considering his young age and LV dysfunction we should be aggressive here, we will plan for a cath on Monday.    Signed, Lars Masson MD, [facility] 12/23/2014

## 2014-12-23 NOTE — Progress Notes (Signed)
Subjective: No acute events overnight. He reports brief episode of chest pain last night. None since. Denies dyspnea or LE edema. No new complaints otherwise.  Objective: Vital signs in last 24 hours: Filed Vitals:   12/22/14 1208 12/22/14 2015 12/22/14 2215 12/23/14 0500  BP:  120/77 120/74 101/58  Pulse: 151 81  75  Temp:  98.7 F (37.1 C)  97.7 F (36.5 C)  TempSrc:  Oral  Oral  Resp:  16  18  Height:      Weight:    159 lb (72.122 kg)  SpO2:  100%  100%   Weight change: -1 lb 3.2 oz (-0.544 kg)  Intake/Output Summary (Last 24 hours) at 12/23/14 0828 Last data filed at 12/23/14 0526  Gross per 24 hour  Intake    480 ml  Output    600 ml  Net   -120 ml   General Apperance: NAD HEENT: Normocephalic, atraumatic, PERRL, EOMI, anicteric sclera Neck: Supple, trachea midline. No JVD. Lungs: Clear to auscultation bilaterally. No wheezes, rhonchi or rales. Breathing comfortably Heart: Regular rate and rhythm, no murmur/rub/gallop Abdomen: Soft, nontender, nondistended, no rebound/guarding Extremities: Normal, atraumatic, warm and well perfused, no edema Pulses: 2+ throughout Skin: No rashes or lesions Neurologic: Alert and oriented x 3. CNII-XII intact. Normal strength and sensation  Lab Results: Basic Metabolic Panel:  Recent Labs Lab 12/21/14 0520 12/22/14 0316  NA 138 137  K 4.1 4.2  CL 107 106  CO2 22 24  GLUCOSE 97 91  BUN 12 12  CREATININE 0.90 0.88  CALCIUM 9.5 9.1   CBC:  Recent Labs Lab 12/21/14 0520  WBC 9.6  NEUTROABS 5.0  HGB 16.4  HCT 46.2  MCV 85.1  PLT 307   Cardiac Enzymes:  Recent Labs Lab 12/21/14 0930 12/21/14 1552 12/21/14 2006  TROPONINI <0.03 <0.03 <0.03   Hemoglobin A1C:  Recent Labs Lab 12/21/14 0803  HGBA1C 5.8*   Fasting Lipid Panel:  Recent Labs Lab 12/21/14 0804  CHOL 119  HDL 40*  LDLCALC 74  TRIG 27  CHOLHDL 3.0   Urine Drug Screen: Drugs of Abuse     Component Value Date/Time   LABOPIA NONE  DETECTED 12/21/2014 1635   COCAINSCRNUR NONE DETECTED 12/21/2014 1635   LABBENZ NONE DETECTED 12/21/2014 1635   AMPHETMU NONE DETECTED 12/21/2014 1635   THCU POSITIVE* 12/21/2014 1635   LABBARB POSITIVE* 12/21/2014 1635    Micro Results: No results found for this or any previous visit (from the past 240 hour(s)).   Studies/Results: Nm Myocar Multi W/spect W/wall Motion / Ef  12/22/2014  CLINICAL DATA:  Coronary disease post MI, hypertension, ischemic cardiomyopathy, prior catheterization 04/28/2014 EXAM: MYOCARDIAL IMAGING WITH SPECT (REST AND EXERCISE) GATED LEFT VENTRICULAR WALL MOTION STUDY LEFT VENTRICULAR EJECTION FRACTION TECHNIQUE: Standard myocardial SPECT imaging was performed after resting intravenous injection of 10 mCi Tc-68m sestamibi. Subsequently, exercise tolerance test was performed by the patient under the supervision of the Cardiology staff. At peak-stress, 30 mCi Tc-76m sestamibi was injected intravenously and standard myocardial SPECT imaging was performed. Quantitative gated imaging was also performed to evaluate left ventricular wall motion, and estimate left ventricular ejection fraction. COMPARISON:  None FINDINGS: Perfusion: Large perfusion defect identified involving the anteroseptal and apical walls of the LEFT ventricle, extending slightly into inferoapical wall. No significant reversibility identified. Wall Motion: Global hypokinesia with apical dyskinesia Left Ventricular Ejection Fraction: 35 % End diastolic volume 124 ml End systolic volume 80 ml IMPRESSION: 1. Large area of non reversible  decreased myocardial perfusion involving the anteroseptal and apical walls of the LEFT ventricle extending slightly to the inferoapical wall. 2. Global hypokinesia with dyskinesia of the apex. 3. Left ventricular ejection fraction 35% 4. Intermediate-risk stress test findings*. *2012 Appropriate Use Criteria for Coronary Revascularization Focused Update: J Am Coll Cardiol.  2012;59(9):857-881. http://content.dementiazones.com.aspx?[email or web address] Electronically Signed   By: Ulyses Southward M.D.   On: 12/22/2014 15:53   Medications: I have reviewed the patient's current medications. Scheduled Meds: . aspirin EC  81 mg Oral Daily  . atorvastatin  80 mg Oral q1800  . enoxaparin (LOVENOX) injection  40 mg Subcutaneous Q24H  . Influenza vac split quadrivalent PF  0.5 mL Intramuscular Tomorrow-1000  . lisinopril  5 mg Oral BID  . spironolactone  12.5 mg Oral Daily  . ticagrelor  90 mg Oral BID   Continuous Infusions:  PRN Meds:.acetaminophen, morphine injection, morphine, nitroGLYCERIN, ondansetron (ZOFRAN) IV   Assessment/Plan: 24 year old man with history of STEMI with thrombectomy/DES to LAD in April 2016 presented with chest pain.  CAD: s/p thrombectomy/DES to LAD 04/2014 with recurrent chest pain. Troponin negative x3. EKG with no acute changes. Lipid panel unremarkable with cholesterol 119, HDL 40, LDL 74. -Cardiology following, appreciate recs -Cardiolipin ab, beta 2 glycoprotein ab pending  -Continue ASA 81 mg, Lipitor 80 mg, lisinopril 5 mg BID, spironolactone 12.5 mg -Brilinta 90 mg BID -Nitro SL 0.4mg  q prn CP -Carvedilol 3.125mg  BID held. -Intermediate risk stress test findings on Myoview. Cath on Monday.  Ischemic cardiomyopathy: Echo 04/30/2014: LV EF 35-40%. Repeat 09/2014 with EF 50-55%. Echo 12/21/2014 with EF of 35-40%.  Heterozygote prothrombin gene mutation: Unlikely to be clinically significant. Would be more consistent with venous thromboembolus than an arterial embolism. Bubble study at [facility] was negative for PFO.  Pre-diabetes: Hgb A1c 5.8  VTE PPx: Lovenox   Dispo: Disposition is deferred at this time, awaiting improvement of current medical problems.  Anticipated discharge in approximately 1-2 day(s).   The patient does not have a current PCP (No primary care provider on file.) and does need an [facility] hospital follow-up  appointment after discharge.  The patient does not have transportation limitations that hinder transportation to clinic appointments.  .Services Needed at time of discharge: Y = Yes, Blank = No PT:   OT:   RN:   Equipment:   Other:       Lora Paula, MD 12/23/2014, 8:28 AM

## 2014-12-23 NOTE — Progress Notes (Signed)
Report received via Shanda Bumps RN using SBAR format in patient's room, updated on today's events assumed care of patient.

## 2014-12-23 NOTE — Plan of Care (Signed)
Problem: Consults Goal: Cardiac Cath Patient Education (See Patient Education module for education specifics.) Outcome: Completed/Met Date Met:  12/23/14 Pt educated on cardiac catheterization with possible percutaneous coronary intervention. Pt refusescardiac cath video states he is familiar with protocol  "I just went through this in April". Pt feels properly educated, no further questions.

## 2014-12-24 ENCOUNTER — Encounter (HOSPITAL_COMMUNITY): Payer: Self-pay | Admitting: Internal Medicine

## 2014-12-24 ENCOUNTER — Encounter (HOSPITAL_COMMUNITY)
Admission: EM | Disposition: A | Discharge status: Home / Self Care | Payer: Self-pay | Source: Home / Self Care | Attending: Emergency Medicine

## 2014-12-24 DIAGNOSIS — R931 Abnormal findings on diagnostic imaging of heart and coronary circulation: Secondary | ICD-10-CM

## 2014-12-24 DIAGNOSIS — I200 Unstable angina: Secondary | ICD-10-CM

## 2014-12-24 DIAGNOSIS — R0789 Other chest pain: Secondary | ICD-10-CM

## 2014-12-24 DIAGNOSIS — R9439 Abnormal result of other cardiovascular function study: Secondary | ICD-10-CM | POA: Insufficient documentation

## 2014-12-24 HISTORY — PX: CARDIAC CATHETERIZATION: SHX172

## 2014-12-24 LAB — CBC
HCT: 46 % (ref 39.0–52.0)
HEMOGLOBIN: 15.9 g/dL (ref 13.0–17.0)
MCH: 30 pg (ref 26.0–34.0)
MCHC: 34.6 g/dL (ref 30.0–36.0)
MCV: 86.8 fL (ref 78.0–100.0)
PLATELETS: 257 10*3/uL (ref 150–400)
RBC: 5.3 MIL/uL (ref 4.22–5.81)
RDW: 13.1 % (ref 11.5–15.5)
WBC: 9.2 10*3/uL (ref 4.0–10.5)

## 2014-12-24 LAB — TROPONIN I: Troponin I: <0.03 ng/mL (ref <0.031)

## 2014-12-24 LAB — BASIC METABOLIC PANEL
Anion gap: 7 (ref 5–15)
BUN: 11 mg/dL (ref 6–20)
CALCIUM: 9.3 mg/dL (ref 8.9–10.3)
CHLORIDE: 109 mmol/L (ref 101–111)
CO2: 21 mmol/L — AB (ref 22–32)
CREATININE: 0.85 mg/dL (ref 0.61–1.24)
GFR calc non Af Amer: >60 mL/min (ref >60)
Glucose, Bld: 89 mg/dL (ref 65–99)
Potassium: 4 mmol/L (ref 3.5–5.1)
SODIUM: 137 mmol/L (ref 135–145)

## 2014-12-24 LAB — MAGNESIUM: Magnesium: 1.9 mg/dL (ref 1.7–2.4)

## 2014-12-24 LAB — HEPARIN LEVEL (UNFRACTIONATED): HEPARIN UNFRACTIONATED: 0.7 [IU]/mL (ref 0.30–0.70)

## 2014-12-24 LAB — POCT ACTIVATED CLOTTING TIME: Activated Clotting Time: 132 seconds

## 2014-12-24 SURGERY — LEFT HEART CATH AND CORONARY ANGIOGRAPHY
Anesthesia: LOCAL

## 2014-12-24 MED ORDER — NITROGLYCERIN IN D5W 200-5 MCG/ML-% IV SOLN
0.0000 ug/min | INTRAVENOUS | Status: DC
Start: 2014-12-24 — End: 2014-12-24
  Administered 2014-12-24: 10 ug/min via INTRAVENOUS

## 2014-12-24 MED ORDER — ONDANSETRON HCL 4 MG/2ML IJ SOLN: 4.0000 mg | Freq: Four times a day (QID) | INTRAMUSCULAR | Status: DC | PRN

## 2014-12-24 MED ORDER — LIDOCAINE HCL (PF) 1 % IJ SOLN
INTRAMUSCULAR | Status: AC
  Filled 2014-12-24: qty 30

## 2014-12-24 MED ORDER — SODIUM CHLORIDE 0.9 % WEIGHT BASED INFUSION: 3.0000 mL/kg/h | INTRAVENOUS | Status: AC

## 2014-12-24 MED ORDER — LIDOCAINE HCL (PF) 1 % IJ SOLN
INTRAMUSCULAR | Status: DC | PRN
  Administered 2014-12-24: 12 mL

## 2014-12-24 MED ORDER — HEPARIN BOLUS VIA INFUSION
3000.0000 [IU] | Freq: Once | INTRAVENOUS | Status: AC
  Administered 2014-12-24: 3000 [IU] via INTRAVENOUS
  Filled 2014-12-24: qty 3000

## 2014-12-24 MED ORDER — HEPARIN (PORCINE) IN NACL 2-0.9 UNIT/ML-% IJ SOLN
INTRAMUSCULAR | Status: DC | PRN
  Administered 2014-12-24: 13:00:00

## 2014-12-24 MED ORDER — FENTANYL CITRATE (PF) 100 MCG/2ML IJ SOLN
INTRAMUSCULAR | Status: AC
  Filled 2014-12-24: qty 2

## 2014-12-24 MED ORDER — ACETAMINOPHEN 325 MG PO TABS: 650.0000 mg | ORAL_TABLET | ORAL | Status: DC | PRN

## 2014-12-24 MED ORDER — MIDAZOLAM HCL 2 MG/2ML IJ SOLN
INTRAMUSCULAR | Status: AC
  Filled 2014-12-24: qty 2

## 2014-12-24 MED ORDER — GI COCKTAIL ~~LOC~~
30.0000 mL | Freq: Once | ORAL | Status: AC
  Administered 2014-12-24: 30 mL via ORAL
  Filled 2014-12-24: qty 30

## 2014-12-24 MED ORDER — HEPARIN (PORCINE) IN NACL 100-0.45 UNIT/ML-% IJ SOLN
900.0000 [IU]/h | INTRAMUSCULAR | Status: DC
  Administered 2014-12-24: 900 [IU]/h via INTRAVENOUS
  Filled 2014-12-24: qty 250

## 2014-12-24 MED ORDER — SODIUM CHLORIDE 0.9 % IJ SOLN: 3.0000 mL | INTRAMUSCULAR | Status: DC | PRN

## 2014-12-24 MED ORDER — SODIUM CHLORIDE 0.9 % IJ SOLN: 3.0000 mL | Freq: Two times a day (BID) | INTRAMUSCULAR | Status: DC

## 2014-12-24 MED ORDER — MORPHINE SULFATE (PF) 2 MG/ML IV SOLN
2.0000 mg | Freq: Once | INTRAVENOUS | Status: AC
  Administered 2014-12-24: 2 mg via INTRAVENOUS

## 2014-12-24 MED ORDER — HEPARIN (PORCINE) IN NACL 2-0.9 UNIT/ML-% IJ SOLN
INTRAMUSCULAR | Status: AC
  Filled 2014-12-24: qty 1000

## 2014-12-24 MED ORDER — SODIUM CHLORIDE 0.9 % IV SOLN: 250.0000 mL | INTRAVENOUS | Status: DC | PRN

## 2014-12-24 MED ORDER — MIDAZOLAM HCL 2 MG/2ML IJ SOLN
INTRAMUSCULAR | Status: DC | PRN
  Administered 2014-12-24: 2 mg via INTRAVENOUS

## 2014-12-24 MED ORDER — NITROGLYCERIN IN D5W 200-5 MCG/ML-% IV SOLN
INTRAVENOUS | Status: AC
  Administered 2014-12-24: 10 ug/min
  Filled 2014-12-24: qty 250

## 2014-12-24 MED ORDER — FENTANYL CITRATE (PF) 100 MCG/2ML IJ SOLN
INTRAMUSCULAR | Status: DC | PRN
  Administered 2014-12-24: 50 ug via INTRAVENOUS

## 2014-12-24 MED ORDER — PANTOPRAZOLE SODIUM 40 MG PO TBEC: 40.0000 mg | DELAYED_RELEASE_TABLET | Freq: Two times a day (BID) | ORAL | Status: DC

## 2014-12-24 MED ORDER — IOHEXOL 350 MG/ML SOLN
INTRAVENOUS | Status: DC | PRN
  Administered 2014-12-24: 50 mL via INTRA_ARTERIAL

## 2014-12-24 SURGICAL SUPPLY — 10 items
CATH INFINITI 5 FR 3DRC (CATHETERS) ×2 IMPLANT
CATH INFINITI 5FR MULTPACK ANG (CATHETERS) ×2 IMPLANT
GLIDESHEATH SLEND SS 6F .021 (SHEATH) IMPLANT
KIT HEART LEFT (KITS) ×2 IMPLANT
PACK CARDIAC CATHETERIZATION (CUSTOM PROCEDURE TRAY) ×2 IMPLANT
SHEATH PINNACLE 5F 10CM (SHEATH) ×2 IMPLANT
SYR MEDRAD MARK V 150ML (SYRINGE) ×2 IMPLANT
TRANSDUCER W/STOPCOCK (MISCELLANEOUS) ×2 IMPLANT
WIRE EMERALD 3MM-J .035X150CM (WIRE) ×2 IMPLANT
WIRE SAFE-T 1.5MM-J .035X260CM (WIRE) IMPLANT

## 2014-12-24 NOTE — Progress Notes (Signed)
Dr. Donnie Aho returned the call, and after being updated on the events, orders were received to give a GI Cocktail and another 2 MG IV Morphine, orders also received for stat labwork for Troponins and to be repeated 2 hours later, orders to start a Nitroglycerin and Heparin drip as per pharmacy to dose. Patient now on every 15 minute VS and will continue to monitor.

## 2014-12-24 NOTE — Progress Notes (Signed)
Patient discharged per orders. Patient's brother at the bedside. All d/c orders/instructions discussed including medications, home/self care, follow up appointments, prescriptions, when to call the doctor or seek immediate medical attention. Time allowed for questions and concerns. Patient and brother stated they had none. Patient will leave the unit via wheelchair to the main entrance. Asher Muir Song Garris,RN

## 2014-12-24 NOTE — Progress Notes (Signed)
Patient c/o chest pain rated 6.5 sharp and stabbing midsternal radiating to his back, VS taken and NTG given S/L x 1, O2 placed on patient at 2 LPM for comfort but his pulse ox never dropped below 98% on Room Air and an EKG done, notified charge nurse and continued to monitor his pain, VS and effectiveness of treatment. Patient received NTG S/L x 3 and 2 MG of Morphine IV but was only able to get his pain down to a 5/10, VSS at this time and page sent to Dr Donnie Aho for further orders. Patient is scheduled for a heart catheterization today and has remained NPO since midnight, will continue to monitor.

## 2014-12-24 NOTE — Discharge Summary (Signed)
Name: Chad Fitzgerald MRN: [id number] DOB: 11/04/1990 24 y.o. PCP: No primary care provider on file.  Date of Admission: 12/21/2014  4:42 AM Date of Discharge: 12/24/2014 Attending Physician: Tyson Alias, MD  Discharge Diagnosis: Principal Problem:   Chest pain Active Problems:   Acute MI, anterolateral wall, subsequent episode of care [facility])   Ischemic cardiomyopathy   CAD S/P LAD PCI - 3 mm x 18 mm long Xience DES (3.3 mm)   Unstable angina (HCC)   Abnormal nuclear stress test  Discharge Medications:   Medication List    STOP taking these medications        ondansetron 4 MG disintegrating tablet  Commonly known as:  ZOFRAN ODT     oxyCODONE-acetaminophen 5-325 MG tablet  Commonly known as:  PERCOCET/ROXICET     tamsulosin 0.4 MG Caps capsule  Commonly known as:  FLOMAX      TAKE these medications        aspirin 81 MG EC tablet  Take 1 tablet (81 mg total) by mouth daily.     atorvastatin 80 MG tablet  Commonly known as:  LIPITOR  Take 1 tablet (80 mg total) by mouth daily at 6 PM.     carvedilol 3.125 MG tablet  Commonly known as:  COREG  Take 1 tablet (3.125 mg total) by mouth 2 (two) times daily with a meal.     lisinopril 5 MG tablet  Commonly known as:  PRINIVIL,ZESTRIL  Take 1 tablet (5 mg total) by mouth 2 (two) times daily.     nitroGLYCERIN 0.4 MG SL tablet  Commonly known as:  NITROSTAT  Place 1 tablet (0.4 mg total) under the tongue every 5 (five) minutes as needed for chest pain.     spironolactone 25 MG tablet  Commonly known as:  ALDACTONE  Take 0.5 tablets (12.5 mg total) by mouth daily.     ticagrelor 90 MG Tabs tablet  Commonly known as:  BRILINTA  Take 1 tablet (90 mg total) by mouth 2 (two) times daily.        Disposition and follow-up:   [name] was discharged from [facility] in Good condition.  At the hospital follow up visit please address:  1.  Chest pain: Cardiac work up  negative. Cath with patent LAD stent. Possibly related to excessive caffeine intake prior to admission. Given Protonix bid for 3 month course. Please assess for any further episodes of Chest pain.   2.  Labs / imaging needed at time of follow-up: None  3.  Pending labs/ test needing follow-up: None  Follow-up Appointments: Follow-up Information    Follow up with [facility].   Why:  We will call you to schedule an appointment on Monday   Contact information:   [address] [place] [address] [phone]      Discharge Instructions:   Consultations: Treatment Team:  Rounding Lbcardiology, MD  Procedures Performed:  Dg Chest 2 View  12/21/2014  CLINICAL DATA:  Acute onset of generalized chest pain. Initial encounter. EXAM: CHEST  2 VIEW COMPARISON:  Chest radiograph performed 04/28/2014 FINDINGS: The lungs are well-aerated and clear. There is no evidence of focal opacification, pleural effusion or pneumothorax. The heart is normal in size; the mediastinal contour is within normal limits. No acute osseous abnormalities are seen. IMPRESSION: No acute cardiopulmonary process seen. Electronically Signed   By: Roanna Raider M.D.   On: 12/21/2014 05:35   Nm  Myocar Multi W/spect W/wall Motion / Ef  12/22/2014  CLINICAL DATA:  Coronary disease post MI, hypertension, ischemic cardiomyopathy, prior catheterization 04/28/2014 EXAM: MYOCARDIAL IMAGING WITH SPECT (REST AND EXERCISE) GATED LEFT VENTRICULAR WALL MOTION STUDY LEFT VENTRICULAR EJECTION FRACTION TECHNIQUE: Standard myocardial SPECT imaging was performed after resting intravenous injection of 10 mCi Tc-8m sestamibi. Subsequently, exercise tolerance test was performed by the patient under the supervision of the Cardiology staff. At peak-stress, 30 mCi Tc-79m sestamibi was injected intravenously and standard myocardial SPECT imaging was performed. Quantitative gated imaging was also performed to  evaluate left ventricular wall motion, and estimate left ventricular ejection fraction. COMPARISON:  None FINDINGS: Perfusion: Large perfusion defect identified involving the anteroseptal and apical walls of the LEFT ventricle, extending slightly into inferoapical wall. No significant reversibility identified. Wall Motion: Global hypokinesia with apical dyskinesia Left Ventricular Ejection Fraction: 35 % End diastolic volume 124 ml End systolic volume 80 ml IMPRESSION: 1. Large area of non reversible decreased myocardial perfusion involving the anteroseptal and apical walls of the LEFT ventricle extending slightly to the inferoapical wall. 2. Global hypokinesia with dyskinesia of the apex. 3. Left ventricular ejection fraction 35% 4. Intermediate-risk stress test findings*. *2012 Appropriate Use Criteria for Coronary Revascularization Focused Update: J Am Coll Cardiol. 2012;59(9):857-881. http://content.dementiazones.com.aspx?articleid=[email or web address] Electronically Signed   By: Ulyses Southward M.D.   On: 12/22/2014 15:53    2D Echo:  Study Conclusions: Left ventricle: LVEF is approximately 35 to 40% with akinesis of the distal septal,, distal inferior and apical walls The cavity size was normal. Wall thickness was normal.  Cardiac Cath: Assessment: 1) LAD stent widely patent 2) Otherwise normal coronaries  3) LVEDP 34mm HG 4) ICM with EF 35-40% by echo   Plan/Discussion: His LAD stent is widely patent. Otherwise normal coronaries. Continue aggressive RF modification.   Admission HPI:  24 YOM with PMH of ADHD (previously on adderall), STEMI with thrombectomy/DES to LAD (04/2014) presents to ED with chest pain. He had ischemic cardiomyopathy with EF of 35-40% (echo 04/2014) at last admission, but repeat echo at [facility] in September 2016 revealed an LVEF of 50-55%. No other significant history but did undergo a hypercoag w/u which revealed a prothrombin gene mutation. Anticardiolipin ab was insufficient for  analysis. He was started on ASA, brilinta, statin, carvedilol, lisinopril which he reports compliance. He has been lost to follow up with cardiology (LOV 05/2011).   Today, he reports substernal chest pain that began around 1AM that woke him up out of bed. States the pain radiated to his back. Also endorses associated SOB, lightheadedness, and diaphoresis. Denies N/V. States the pain was constant but would increase in intensity at times. States it felt like the same pain he had previously with his MI in April. He took 3 ASA around 3AM when the pain did not subside and called EMS. EKG without acute changes. I-stat trop neg. He was given toradol and ativan in the ED with some relief of his chest pain. He has occasional GERD but takes no medications. Of note, he does report drinking more coffee and being under more stress with final exams approaching. He reports current chest pain 3/10. He endorses marijuana use but denies any other recreational drugs use. However, according to [facility] records, he endorses occasional cocaine use. Drinks alcohol on occasion and used to smoke a couple of cigarettes per week when with friends. 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   Hospital Course by problem list:  Chest pain: Patient presented with acute onset substernal chest pain that woke him up from sleep. He is s/p thrombectomy/DES to LAD 04/2014. Initial troponins were negative x 3 with no acute EKG changes from his baseline. Lipid panel unremarkable. Finished hypercoagulability workup that was incomplete from last visit (insufficent blood to run the test) and Cardiolipin ab and beta 2 glycoprotein ab negative. Did report family history of heart disease but all >38 years old. Myoview stress test showed intermediate risk and he went for cath. The night prior to cath he had another episode of chest pain, controlled on nitro drip and restarted heparin drip. Troponins were again  negative and no EKG changes. Cath showed LAD stent widely patent with otherwise normal coronaries. LVEDP 5 mmHg. Ruled out any further cardiac etiology of his chest pain, negative hypercoagulability work up. Symptoms possibly related to GERD consistent with his reported 6 cups of coffee intake over the week prior to admission. Will give Protonix 40 mg bid for 3 month course. Continue ASA 81 mg, Lipitor 80 mg, lisinopril 5 mg BID, spironolactone 12.5 mg, Brilinta 90 mg BID, Nitro SL 0.4mg  q prn CP, Carvedilol 3.125mg  BID. Follow up with cardiology as an outpatient.   Ischemic cardiomyopathy: Echo 04/30/2014: LV EF 35-40%. Repeat 09/2014 with EF 50-55%. Echo 12/21/2014 with EF of 35-40%.  Heterozygote prothrombin gene mutation: Unlikely to be clinically significant. Would be more consistent with venous thromboembolus than an arterial embolism. Bubble study at [facility] was negative for PFO.  Pre-diabetes: Hgb A1c 5.8.  Discharge Vitals:   BP 100/66 mmHg  Pulse 66  Temp(Src) 98.6 F (37 C) (Oral)  Resp 6  Ht  (1.753 m)  Wt 158 lb 8 oz (71.895 kg)  BMI 23.40 kg/m2  SpO2 99%  Discharge Labs:  Results for orders placed or performed during the hospital encounter of 12/21/14 (from the past 24 hour(s))  Protime-INR     Status: None   Collection Time: 12/23/14  3:30 PM  Result Value Ref Range   Prothrombin Time 13.6 11.6 - 15.2 seconds   INR 1.02 0.00 - 1.49  Basic metabolic panel     Status: Abnormal   Collection Time: 12/24/14  2:36 AM  Result Value Ref Range   Sodium 137 135 - 145 mmol/L   Potassium 4.0 3.5 - 5.1 mmol/L   Chloride 109 101 - 111 mmol/L   CO2 21 (L) 22 - 32 mmol/L   Glucose, Bld 89 65 - 99 mg/dL   BUN 11 6 - 20 mg/dL   Creatinine, Ser 8.65 0.61 - 1.24 mg/dL   Calcium 9.3 8.9 - 78.4 mg/dL   GFR calc non Af Amer >60 >60 mL/min   GFR calc Af Amer >60 >60 mL/min   Anion gap 7 5 - 15  Magnesium     Status: None   Collection Time: 12/24/14  2:36 AM  Result Value Ref  Range   Magnesium 1.9 1.7 - 2.4 mg/dL  Troponin I     Status: None   Collection Time: 12/24/14  2:36 AM  Result Value Ref Range   Troponin I <0.03 <0.031 ng/mL  Troponin I     Status: None   Collection Time: 12/24/14  5:03 AM  Result Value Ref Range   Troponin I <0.03 <0.031 ng/mL  CBC     Status: None   Collection Time: 12/24/14 10:05 AM  Result Value Ref Range   WBC 9.2 4.0 - 10.5 K/uL   RBC 5.30 4.22 -  5.81 MIL/uL   Hemoglobin 15.9 13.0 - 17.0 g/dL   HCT 16.1 09.6 - 04.5 %   MCV 86.8 78.0 - 100.0 fL   MCH 30.0 26.0 - 34.0 pg   MCHC 34.6 30.0 - 36.0 g/dL   RDW 40.9 81.1 - 91.4 %   Platelets 257 150 - 400 K/uL  Heparin level (unfractionated)     Status: None   Collection Time: 12/24/14 10:05 AM  Result Value Ref Range   Heparin Unfractionated 0.70 0.30 - 0.70 IU/mL    Signed: Valentino Nose, MD 12/24/2014, 2:13 PM

## 2014-12-24 NOTE — Progress Notes (Addendum)
Site area: right groin a 5 french arterial sheath was removed  Site Prior to Removal:  Level 0  Pressure Applied For 15 MINUTES    Minutes Beginning at 1345p  Manual:   Yes.    Patient Status During Pull:  stable  Post Pull Groin Site:  Level 0  Post Pull Instructions Given:  Yes.    Post Pull Pulses Present:  Yes.    Dressing Applied:  Yes.    Comments:  VS remain stable during sheath pull

## 2014-12-24 NOTE — Progress Notes (Signed)
ANTICOAGULATION CONSULT NOTE - Initial Consult  Pharmacy Consult for heparin Indication: chest pain/ACS  Allergies  Allergen Reactions  . Bee Pollen Hives, Itching and Rash    Patient Measurements: Height:  (175.3 cm) Weight: 159 lb (72.122 kg) IBW/kg (Calculated) : 70.7  Vital Signs: Temp: 99 F (37.2 C) (12/18 2006) Temp Source: Oral (12/18 2006) BP: 101/64 mmHg (12/19 0156) Pulse Rate: 76 (12/18 2006)  Labs:  Recent Labs  12/21/14 0520 12/21/14 0930 12/21/14 1552 12/21/14 2006 12/22/14 0316 12/23/14 1530  HGB 16.4  --   --   --   --   --   HCT 46.2  --   --   --   --   --   PLT 307  --   --   --   --   --   LABPROT  --   --   --   --   --  13.6  INR  --   --   --   --   --  1.02  CREATININE 0.90  --   --   --  0.88  --   TROPONINI <0.03 <0.03 <0.03 <0.03  --   --     Estimated Creatinine Clearance: 128.3 mL/min (by C-G formula based on Cr of 0.88).   Medical History: Past Medical History  Diagnosis Date  . ADD (attention deficit disorder)   . CAD (coronary artery disease)     a. 04/28/14: acute anterolateral STEMI s/p DES to LAD  . Ischemic cardiomyopathy     a. 04/29/13: 2D ECHO: LVEF 35-40%  . Low serum HDL   . Hyperglycemia     a. 04/2014: Hg A1c 5.3  . Asthma     as a child  . Hypertension   . MI (myocardial infarction) (HCC)     Medications:  Prescriptions prior to admission  Medication Sig Dispense Refill Last Dose  . aspirin EC 81 MG EC tablet Take 1 tablet (81 mg total) by mouth daily.   12/20/2014 at Unknown time  . atorvastatin (LIPITOR) 80 MG tablet Take 1 tablet (80 mg total) by mouth daily at 6 PM. 30 tablet 11 12/19/2014 at Unknown time  . carvedilol (COREG) 3.125 MG tablet Take 1 tablet (3.125 mg total) by mouth 2 (two) times daily with a meal. 60 tablet 11 12/20/2014 at 1800  . lisinopril (PRINIVIL,ZESTRIL) 5 MG tablet Take 1 tablet (5 mg total) by mouth 2 (two) times daily. 60 tablet 11 12/20/2014 at Unknown time  .  nitroGLYCERIN (NITROSTAT) 0.4 MG SL tablet Place 1 tablet (0.4 mg total) under the tongue every 5 (five) minutes as needed for chest pain. 25 tablet 12 unknown  . spironolactone (ALDACTONE) 25 MG tablet Take 0.5 tablets (12.5 mg total) by mouth daily. 30 tablet 11 12/20/2014 at Unknown time  . ticagrelor (BRILINTA) 90 MG TABS tablet Take 1 tablet (90 mg total) by mouth 2 (two) times daily. 60 tablet 11 12/20/2014 at Unknown time  . ondansetron (ZOFRAN ODT) 4 MG disintegrating tablet Take 1 tablet (4 mg total) by mouth every 8 (eight) hours as needed for nausea or vomiting. (Patient not taking: Reported on 12/21/2014) 10 tablet 0 Not Taking at Unknown time  . oxyCODONE-acetaminophen (PERCOCET/ROXICET) 5-325 MG per tablet Take 1-2 tablets by mouth every 6 (six) hours as needed for severe pain. (Patient not taking: Reported on 12/21/2014) 15 tablet 0 Completed Course at Unknown time  . tamsulosin (FLOMAX) 0.4 MG CAPS capsule Take 1 capsule (  0.4 mg total) by mouth daily. (Patient not taking: Reported on 12/21/2014) 7 capsule 0 Not Taking at Unknown time   Scheduled:  . aspirin  81 mg Oral Pre-Cath  . aspirin EC  81 mg Oral Daily  . atorvastatin  80 mg Oral q1800  . enoxaparin (LOVENOX) injection  40 mg Subcutaneous Q24H  . gi cocktail  30 mL Oral Once  . Influenza vac split quadrivalent PF  0.5 mL Intramuscular Tomorrow-1000  . lisinopril  5 mg Oral BID  .  morphine injection  2 mg Intravenous Once  . nitroGLYCERIN      . sodium chloride  3 mL Intravenous Q12H  . spironolactone  12.5 mg Oral Daily  . ticagrelor  90 mg Oral BID   Infusions:  . sodium chloride     Followed by  . sodium chloride    . nitroGLYCERIN      Assessment: 24yo male w/ h/o CAD and stent to LAD in April s/p STEMI c/o CP not as severe as prior MI, stress test shows large scar in LAD territory, has been on LMWH DVT Px, now to transition to UFH prior to cath.  Goal of Therapy:  Heparin level 0.3-0.7 units/ml Monitor  platelets by anticoagulation protocol: Yes   Plan:  Last LMWH dose was yesterday am; will give heparin 3000 units IV bolus x1 followed by gtt at 900 units/hr and monitor heparin levels and CBC.  Vernard Gambles, PharmD, BCPS  12/24/2014,2:10 AM

## 2014-12-24 NOTE — Progress Notes (Signed)
Updated report received in patient's room using SBAR format, assumed care of patient.

## 2014-12-24 NOTE — Interval H&P Note (Signed)
History and Physical Interval Note:  12/24/2014 12:53 PM  Chad Fitzgerald  has presented today for surgery, with the diagnosis of unstable angina  The various methods of treatment have been discussed with the patient and family. After consideration of risks, benefits and other options for treatment, the patient has consented to  Procedure(s): Left Heart Cath and Coronary Angiography (N/A) and possible coronary angioplasty as a surgical intervention .  The patient's history has been reviewed, patient examined, no change in status, stable for surgery.  I have reviewed the patient's chart and labs.  Questions were answered to the patient's satisfaction.     [name], Reuel Boom

## 2014-12-24 NOTE — Discharge Instructions (Addendum)
You cardiac cath showed your prior stent is open and functioning well with no further problems. We will continue your current home medications. We will add on a new medication called Protonix that is twice a day for the next 3 months to see if there is any response.   Please follow up in our clinic downstairs Please call the cardiology clinic to follow up in the next 1-2 weeks.    Cath Site Care Refer to this sheet in the next few weeks. These instructions provide you with information about caring for yourself after your procedure. Your health care provider may also give you more specific instructions. Your treatment has been planned according to current medical practices, but problems sometimes occur. Call your health care provider if you have any problems or questions after your procedure. WHAT TO EXPECT AFTER THE PROCEDURE After your procedure, it is typical to have the following:  Bruising at the site that usually fades within 1-2 weeks.  Blood collecting in the tissue (hematoma) that may be painful to the touch. It should usually decrease in size and tenderness within 1-2 weeks. HOME CARE INSTRUCTIONS  Take medicines only as directed by your health care provider.  You may shower 24-48 hours after the procedure or as directed by your health care provider. Remove the bandage (dressing) and gently wash the site with plain soap and water. Pat the area dry with a clean towel. Do not rub the site, because this may cause bleeding.  Do not take baths, swim, or use a hot tub until your health care provider approves.  Check your insertion site every day for redness, swelling, or drainage.  Do not apply powder or lotion to the site.  Do not flex or bend the affected extremity for 24 hours or as directed by your health care provider.  Do not push or pull heavy objects with the affected leg for 24 hours or as directed by your health care provider.  Do not lift over 10 lb (4.5 kg) for 5 days  after your procedure or as directed by your health care provider.  Ask your health care provider when it is okay to:  Return to work or school.  Resume usual physical activities or sports.  Resume sexual activity.  Do not drive home if you are discharged the same day as the procedure. Have someone else drive you.  You may drive 24 hours after the procedure unless otherwise instructed by your health care provider.  Do not operate machinery or power tools for 24 hours after the procedure.  If your procedure was done as an outpatient procedure, which means that you went home the same day as your procedure, a responsible adult should be with you for the first 24 hours after you arrive home.  Keep all follow-up visits as directed by your health care provider. This is important. SEEK MEDICAL CARE IF:  You have a fever.  You have chills.  You have increased bleeding from the radial site. Hold pressure on the site. SEEK IMMEDIATE MEDICAL CARE IF:  You have unusual pain at the radial site.  You have redness, warmth, or swelling at the radial site.  You have drainage (other than a small amount of blood on the dressing) from the radial site.  The radial site is bleeding, and the bleeding does not stop after 30 minutes of holding steady pressure on the site.  Your arm or hand becomes pale, cool, tingly, or numb.   This information is  not intended to replace advice given to you by your health care provider. Make sure you discuss any questions you have with your health care provider.   Document Released: 01/24/2010 Document Revised: 01/12/2014 Document Reviewed: 07/10/2013 Elsevier Interactive Patient Education [employer]

## 2014-12-24 NOTE — Progress Notes (Signed)
Patient still having some chest discomfort at 2/10 and a little nausea, will increase the Nitro drip to 15 mcgs/min=4.5cc/hr from 10 mcgs/min when he was originally started and will continue to monitor, VSS at this time.

## 2014-12-24 NOTE — Progress Notes (Signed)
ANTICOAGULATION CONSULT NOTE - Follow Up Consult  Pharmacy Consult for Heparin Indication: chest pain/ACS  Allergies  Allergen Reactions  . Bee Pollen Hives, Itching and Rash    Patient Measurements: Height: 5\' 9"  (175.3 cm) Weight: 158 lb 8 oz (71.895 kg) IBW/kg (Calculated) : 70.7 Heparin Dosing Weight: 71.9 kg  Vital Signs: Temp: 98.6 F (37 C) (12/19 0538) Temp Source: Oral (12/19 0538) BP: 102/54 mmHg (12/19 0625) Pulse Rate: 63 (12/19 0625)  Labs:  Recent Labs  12/21/14 2006 12/22/14 0316 12/23/14 1530 12/24/14 0236 12/24/14 0503 12/24/14 1005  HGB  --   --   --   --   --  15.9  HCT  --   --   --   --   --  46.0  PLT  --   --   --   --   --  257  LABPROT  --   --  13.6  --   --   --   INR  --   --  1.02  --   --   --   HEPARINUNFRC  --   --   --   --   --  0.70  CREATININE  --  0.88  --  0.85  --   --   TROPONINI <0.03  --   --  <0.03 <0.03  --     Estimated Creatinine Clearance: 132.9 mL/min (by C-G formula based on Cr of 0.85).  Assessment:   Initial heparin level is therapeutic (0.70) on 900 units/hr.   For cardiac cath later today.  Goal of Therapy:  Heparin level 0.3-0.7 units/ml Monitor platelets by anticoagulation protocol: Yes   Plan:    Continue heparin drip at 900 units/hr.   Daily heparin level and CBC while on heparin.   Follow up post-cath.  Dennie Fetters, RPh Pager: [phone] 12/24/2014,11:32 AM

## 2014-12-24 NOTE — Progress Notes (Signed)
Subjective: Patient had an episode of chest pain with shortness of breath overnight. Troponins negative x2. EKG with no acute changes. Started on nitro drip and heparin drip. Chest pain resolved this morning. Denies any further shortness of breath. Stress test intermediate risk. Will go for cath today.   Objective: Vital signs in last 24 hours: Filed Vitals:   12/24/14 0545 12/24/14 0555 12/24/14 0610 12/24/14 0625  BP: 100/57 96/61 100/56 102/54  Pulse: 64 77 61 63  Temp:      TempSrc:      Resp: 15 17 15 15   Height:      Weight:      SpO2: 99% 99% 98% 98%   Weight change: -8 oz (-0.227 kg)  Intake/Output Summary (Last 24 hours) at 12/24/14 1014 Last data filed at 12/24/14 0700  Gross per 24 hour  Intake    634 ml  Output   2185 ml  Net  -1551 ml    PHYSICAL EXAM General Apperance: NAD HEENT: Normocephalic, atraumatic Neck: Supple, trachea midline. No JVD. Lungs: Clear to auscultation bilaterally. No wheezes, rhonchi or rales. Breathing comfortably Heart: Regular rate and rhythm, no murmur/rub/gallop Abdomen: Soft, nontender, nondistended, no rebound/guarding Extremities: Normal, atraumatic, warm and well perfused, no edema Pulses: 2+ throughout Skin: No rashes or lesions Neurologic: Alert and oriented x 3. CNII-XII intact. Normal strength and sensation  Medications: I have reviewed the patient's current medications. Scheduled Meds: . aspirin EC  81 mg Oral Daily  . atorvastatin  80 mg Oral q1800  . Influenza vac split quadrivalent PF  0.5 mL Intramuscular Tomorrow-1000  . lisinopril  5 mg Oral BID  . sodium chloride  3 mL Intravenous Q12H  . spironolactone  12.5 mg Oral Daily  . ticagrelor  90 mg Oral BID   Continuous Infusions: . sodium chloride 1 mL/kg/hr (12/24/14 0700)  . heparin 900 Units/hr (12/24/14 0700)  . nitroGLYCERIN 10 mcg/min (12/24/14 0700)   PRN Meds:.sodium chloride, acetaminophen, morphine injection, nitroGLYCERIN, ondansetron (ZOFRAN) IV,  sodium chloride Assessment/Plan:  CAD: s/p thrombectomy/DES to LAD 04/2014 with recurrent chest pain. Patient had an episode of chest pain with shortness of breath overnight. Troponins negative x2. EKG with no acute changes. Started on nitro drip and heparin drip. Chest pain resolved this morning. Denies any further shortness of breath. Lipid panel unremarkable with cholesterol 119, HDL 40, LDL 74. Hypercoag workup negative.  -Cardiology following, appreciate recs -Cardiolipin ab, beta 2 glycoprotein ab negative -Continue ASA 81 mg, Lipitor 80 mg, lisinopril 5 mg BID, spironolactone 12.5 mg -Brilinta 90 mg BID -Nitro SL 0.4mg  q prn CP -Carvedilol 3.125mg  BID held. -Intermediate risk stress test findings on Myoview. Cath on today.  Ischemic cardiomyopathy: Echo 04/30/2014: LV EF 35-40%. Repeat 09/2014 with EF 50-55%. Echo 12/21/2014 with EF of 35-40%.  Heterozygote prothrombin gene mutation: Unlikely to be clinically significant. Would be more consistent with venous thromboembolus than an arterial embolism. Bubble study at [facility] was negative for PFO.  Pre-diabetes: Hgb A1c 5.8  VTE PPx: Lovenox  Dispo: Disposition is deferred at this time, awaiting improvement of current medical problems and cath results.   The patient does not have a current PCP (No primary care provider on file.) and does not need an [facility] hospital follow-up appointment after discharge.  The patient does not have transportation limitations that hinder transportation to clinic appointments.  .Services Needed at time of discharge: Y = Yes, Blank = No PT:   OT:   RN:   Equipment:  Other:       Valentino Nose, MD IMTS PGY-1 [phone] 12/24/2014, 10:14 AM

## 2014-12-24 NOTE — H&P (View-Only) (Signed)
Patient Name: Chad Fitzgerald Date of Encounter: 12/23/2014  Principal Problem:   Chest pain Active Problems:   Ischemic cardiomyopathy   CAD S/P LAD PCI   Pain in the chest   Length of Stay:   SUBJECTIVE  Denies chest pain or SOB.   CURRENT MEDS . aspirin EC  81 mg Oral Daily  . atorvastatin  80 mg Oral q1800  . enoxaparin (LOVENOX) injection  40 mg Subcutaneous Q24H  . Influenza vac split quadrivalent PF  0.5 mL Intramuscular Tomorrow-1000  . lisinopril  5 mg Oral BID  . spironolactone  12.5 mg Oral Daily  . ticagrelor  90 mg Oral BID    OBJECTIVE  Filed Vitals:   12/22/14 2015 12/22/14 2215 12/23/14 0500 12/23/14 0800  BP: 120/77 120/74 101/58 113/72  Pulse: 81  75   Temp: 98.7 F (37.1 C)  97.7 F (36.5 C)   TempSrc: Oral  Oral   Resp: 16  18   Height:      Weight:   159 lb (72.122 kg)   SpO2: 100%  100%     Intake/Output Summary (Last 24 hours) at 12/23/14 0914 Last data filed at 12/23/14 0830  Gross per 24 hour  Intake    720 ml  Output    600 ml  Net    120 ml   Filed Weights   12/21/14 0813 12/22/14 0500 12/23/14 0500  Weight: 160 lb 3.2 oz (72.666 kg) 160 lb 6.4 oz (72.757 kg) 159 lb (72.122 kg)    PHYSICAL EXAM  General: Pleasant, NAD. Neuro: Alert and oriented X 3. Moves all extremities spontaneously. Psych: Normal affect. HEENT:  Normal  Neck: Supple without bruits or JVD. Lungs:  Resp regular and unlabored, CTA. Heart: RRR no s3, s4, 2/6 systolic murmur. Abdomen: Soft, non-tender, non-distended, BS + x 4.  Extremities: No clubbing, cyanosis or edema. DP/PT/Radials 2+ and equal bilaterally.  Accessory Clinical Findings  CBC  Recent Labs  12/21/14 0520  WBC 9.6  NEUTROABS 5.0  HGB 16.4  HCT 46.2  MCV 85.1  PLT 307   Basic Metabolic Panel  Recent Labs  12/21/14 0520 12/22/14 0316  NA 138 137  K 4.1 4.2  CL 107 106  CO2 22 24  GLUCOSE 97 91  BUN 12 12  CREATININE 0.90 0.88  CALCIUM 9.5 9.1   Cardiac  Enzymes  Recent Labs  12/21/14 0930 12/21/14 1552 12/21/14 2006  TROPONINI <0.03 <0.03 <0.03   BNP Invalid input(s): POCBNP D-Dimer No results for input(s): DDIMER in the last 72 hours. Hemoglobin A1C  Recent Labs  12/21/14 0803  HGBA1C 5.8*   Fasting Lipid Panel  Recent Labs  12/21/14 0804  CHOL 119  HDL 40*  LDLCALC 74  TRIG 27  CHOLHDL 3.0   Thyroid Function Tests No results for input(s): TSH, T4TOTAL, T3FREE, THYROIDAB in the last 72 hours.  Invalid input(s): FREET3  Radiology/Studies  Dg Chest 2 View  12/21/2014  CLINICAL DATA:  Acute onset of generalized chest pain. Initial encounter. EXAM: CHEST  2 VIEW COMPARISON:  Chest radiograph performed 04/28/2014 FINDINGS: The lungs are well-aerated and clear. There is no evidence of focal opacification, pleural effusion or pneumothorax. The heart is normal in size; the mediastinal contour is within normal limits. No acute osseous abnormalities are seen. IMPRESSION: No acute cardiopulmonary process seen. Electronically Signed   By: Roanna Raider M.D.   On: 12/21/2014 05:35   Nm Myocar Multi W/spect W/wall Motion / Ef  12/22/2014  CLINICAL DATA:  Coronary disease post MI, hypertension, ischemic cardiomyopathy, prior catheterization 04/28/2014  IMPRESSION: 1. Large area of non reversible decreased myocardial perfusion involving the anteroseptal and apical walls of the LEFT ventricle extending slightly to the inferoapical wall. 2. Global hypokinesia with dyskinesia of the apex. 3. Left ventricular ejection fraction 35% 4. Intermediate-risk stress test findings*.   TELE: SR   ASSESSMENT AND PLAN  1. CAD with recurrent chest pain: Troponin is negative with no acute ST T wave changes, arguing against ACS. Plans for stress test today. Hx of DES to LAD. Continue DAPT, statin, ACE. BB on hold for stress test.   2. Hypertension: Low normal. He has no complaints of dizziness.  3. ICM: Last echo on 12/21/2014 EF of 35%-40%.  Carvedilol on hold currently. Still on spironolactone but no diuretics. Will follow. Creatinine 0.88 with potassium of 4.2  24 year old with CAD, s/p PCI to LADS in April , admitted with a similar pain, less intense, negative troponin, unchanged ECG, his stress test shows a large scar in the LAD territory, however there is periinfarct ischemia. Considering his young age and LV dysfunction we should be aggressive here, we will plan for a cath on Monday.    Signed, Nyah Shepherd H MD, FACC 12/23/2014   

## 2014-12-24 NOTE — Progress Notes (Signed)
Patient Profile: 24 year old with CAD, s/p PCI to LADS in April , admitted with a similar pain. Cardiac enzymes negative. NST intermediate risk. Awaiting cath.   Subjective: Currently CP free but he notes several episodes of brief intermittent CP overnight. Also notes anxiety.   Objective: Vital signs in last 24 hours: Temp:  [98.2 F (36.8 C)-99 F (37.2 C)] 98.6 F (37 C) (12/19 0538) Pulse Rate:  [58-81] 63 (12/19 0625) Resp:  [7-18] 15 (12/19 0625) BP: (96-117)/(54-84) 102/54 mmHg (12/19 0625) SpO2:  [97 %-100 %] 98 % (12/19 0625) Weight:  [158 lb 8 oz (71.895 kg)] 158 lb 8 oz (71.895 kg) (12/19 0538) Last BM Date: 12/23/14  Intake/Output from previous day: 12/18 0701 - 12/19 0700 In: 874 [P.O.:820; I.V.:54] Out: 2185 [Urine:2185] Intake/Output this shift:    Medications Current Facility-Administered Medications  Medication Dose Route Frequency Provider Last Rate Last Dose  . 0.9 %  sodium chloride infusion  250 mL Intravenous PRN Jodelle Gross, NP      . 0.9% sodium chloride infusion  1 mL/kg/hr Intravenous Continuous Jodelle Gross, NP 72.1 mL/hr at 12/24/14 0700 1 mL/kg/hr at 12/24/14 0700  . acetaminophen (TYLENOL) tablet 650 mg  650 mg Oral Q4H PRN Marrian Salvage, MD      . aspirin EC tablet 81 mg  81 mg Oral Daily Marrian Salvage, MD   81 mg at 12/23/14 8921  . atorvastatin (LIPITOR) tablet 80 mg  80 mg Oral q1800 Marrian Salvage, MD   80 mg at 12/23/14 1650  . heparin ADULT infusion 100 units/mL (25000 units/250 mL)  900 Units/hr Intravenous Continuous Juliette Mangle, RPH 9 mL/hr at 12/24/14 0700 900 Units/hr at 12/24/14 0700  . Influenza vac split quadrivalent PF (FLUARIX) injection 0.5 mL  0.5 mL Intramuscular Tomorrow-1000 Tyson Alias, MD      . lisinopril (PRINIVIL,ZESTRIL) tablet 5 mg  5 mg Oral BID Marrian Salvage, MD   5 mg at 12/23/14 2133  . morphine 2 MG/ML injection 1-2 mg  1-2 mg Intravenous Q2H PRN Marrian Salvage, MD   2 mg  at 12/24/14 0143  . nitroGLYCERIN (NITROSTAT) SL tablet 0.4 mg  0.4 mg Sublingual Q5 min PRN Marrian Salvage, MD   0.4 mg at 12/24/14 0138  . nitroGLYCERIN 50 mg in dextrose 5 % 250 mL (0.2 mg/mL) infusion  0-200 mcg/min Intravenous Titrated Othella Boyer, MD 3 mL/hr at 12/24/14 0700 10 mcg/min at 12/24/14 0700  . ondansetron (ZOFRAN) injection 4 mg  4 mg Intravenous Q6H PRN Marrian Salvage, MD      . sodium chloride 0.9 % injection 3 mL  3 mL Intravenous Q12H Jodelle Gross, NP   3 mL at 12/23/14 2200  . sodium chloride 0.9 % injection 3 mL  3 mL Intravenous PRN Jodelle Gross, NP      . spironolactone (ALDACTONE) tablet 12.5 mg  12.5 mg Oral Daily Marrian Salvage, MD   12.5 mg at 12/23/14 0834  . ticagrelor (BRILINTA) tablet 90 mg  90 mg Oral BID Marrian Salvage, MD   90 mg at 12/24/14 1941    PE: General appearance: alert, cooperative and no distress Neck: no carotid bruit and no JVD Lungs: clear to auscultation bilaterally Heart: regular rate and rhythm, S1, S2 normal, no murmur, click, rub or gallop Extremities: no LEE Pulses: 2+ and symmetric Skin: warm and dry Neurologic: Grossly normal  Lab Results:  No results for input(s): WBC, HGB, HCT, PLT in the last 72 hours. BMET  Recent Labs  12/22/14 0316 12/24/14 0236  NA 137 137  K 4.2 4.0  CL 106 109  CO2 24 21*  GLUCOSE 91 89  BUN 12 11  CREATININE 0.88 0.85  CALCIUM 9.1 9.3   PT/INR  Recent Labs  12/23/14 1530  LABPROT 13.6  INR 1.02   Lipid Panel     Component Value Date/Time   CHOL 119 12/21/2014 0804   TRIG 27 12/21/2014 0804   HDL 40* 12/21/2014 0804   CHOLHDL 3.0 12/21/2014 0804   VLDL 5 12/21/2014 0804   LDLCALC 74 12/21/2014 0804      Cardiac Panel (last 3 results)  Recent Labs  12/21/14 2006 12/24/14 0236 12/24/14 0503  TROPONINI <0.03 <0.03 <0.03    Assessment/Plan  Principal Problem:   Chest pain Active Problems:   Acute MI, anterolateral wall, subsequent episode  of care (HCC)   Ischemic cardiomyopathy   CAD S/P LAD PCI - 3 mm x 18 mm long Xience DES (3.3 mm)   Unstable angina (HCC)   1. CAD with recurrent chest pain: H/o DES to LAD in April 2016. Troponin levels this admission remained negative x 3 with no acute ST T wave changes, arguing against ACS. NST yesterday was intermediate risk with large scar in the LAD territory, however there is periinfarct ischemia. However, considering his young age and LV dysfunction we should be aggressive here. Plan is for a cath  today to reasess anatomy.  Continue DAPT, statin and ACE.  2. Hypertension: Low normal. He has no complaints of dizziness.  3. ICM: Last echo on 12/21/2014 EF of 35%-40%. Carvedilol on hold currently. Still on spironolactone but no diuretics. Volume stable.  Brittainy M. Sharol Harness, PA-C 12/24/2014 10:04 AM  Personally seen and examined. Agree with above.  CATH reassuring, LAD stent patent EF 35-40% by ECHO Home later today post cath.  No medication changes.   Donato Schultz, MD

## 2015-01-02 ENCOUNTER — Encounter: Payer: Self-pay | Admitting: Internal Medicine

## 2015-01-02 ENCOUNTER — Ambulatory Visit (INDEPENDENT_AMBULATORY_CARE_PROVIDER_SITE_OTHER): Payer: Self-pay | Admitting: Internal Medicine

## 2015-01-02 VITALS — BP 108/72 | HR 80 | Temp 98.4°F | Ht 68.0 in | Wt 163.0 lb

## 2015-01-02 DIAGNOSIS — F064 Anxiety disorder due to known physiological condition: Secondary | ICD-10-CM

## 2015-01-02 DIAGNOSIS — D6852 Prothrombin gene mutation: Secondary | ICD-10-CM | POA: Insufficient documentation

## 2015-01-02 DIAGNOSIS — F410 Panic disorder [episodic paroxysmal anxiety] without agoraphobia: Secondary | ICD-10-CM

## 2015-01-02 DIAGNOSIS — I2109 ST elevation (STEMI) myocardial infarction involving other coronary artery of anterior wall: Secondary | ICD-10-CM

## 2015-01-02 MED ORDER — CITALOPRAM HYDROBROMIDE 10 MG PO TABS: 10.0000 mg | ORAL_TABLET | Freq: Every day | ORAL | Status: DC

## 2015-01-02 NOTE — Assessment & Plan Note (Addendum)
Pt states he is still having some chest pain that is partially relieved with NTG.  However, he reports associated palpitations, diaphoresis.  He says sometimes he feels so much stress that is compounded by his recent MI.  He also feels that he is having panic attacks.  Denies any depression and feels optimistic.  He does endorse feeling much more anxious since the MI and has difficulty with juggling school and work.  He has a h/o ADHD and was previously on adderall.  Normal exam although anxious.   -will prescribe celexa (QT OK) for GAD in discussion with him (he would like to be on a medication at this point) -gave him info for [facility]  -advised to avoid caffeine  -RTC in 6 wks  -will need orange card

## 2015-01-02 NOTE — Patient Instructions (Signed)
Thank you for your visit today.   Please return to the internal medicine clinic in 6 weeks or sooner if needed.     I have made the following additions/changes to your medications:  We will start you on a medication called citalopram for anxiety.  Please take this daily.   We will also refer you to a counseling center, [facility].  I will give you information on their hours.   Be sure to limit caffeine as this will make your anxiety worse.    Please be sure to bring all of your medications with you to every visit; this includes herbal supplements, vitamins, eye drops, and any over-the-counter medications.   Should you have any questions regarding your medications and/or any new or worsening symptoms, please be sure to call the clinic at [phone].   If you believe that you are suffering from a life threatening condition or one that may result in the loss of limb or function, then you should call 911 and proceed to the nearest Emergency Department.    Generalized Anxiety Disorder Generalized anxiety disorder (GAD) is a mental disorder. It interferes with life functions, including relationships, work, and school. GAD is different from normal anxiety, which everyone experiences at some point in their lives in response to specific life events and activities. Normal anxiety actually helps [place] prepare for and get through these life events and activities. Normal anxiety goes away after the event or activity is over.  GAD causes anxiety that is not necessarily related to specific events or activities. It also causes excess anxiety in proportion to specific events or activities. The anxiety associated with GAD is also difficult to control. GAD can vary from mild to severe. People with severe GAD can have intense waves of anxiety with physical symptoms (panic attacks).  SYMPTOMS The anxiety and worry associated with GAD are difficult to control. This anxiety and worry are related to many life events and  activities and also occur more days than not for 6 months or longer. People with GAD also have three or more of the following symptoms (one or more in children):  Restlessness.   Fatigue.  Difficulty concentrating.   Irritability.  Muscle tension.  Difficulty sleeping or unsatisfying sleep. DIAGNOSIS GAD is diagnosed through an assessment by your health care provider. Your health care provider will ask you questions aboutyour mood,physical symptoms, and events in your life. Your health care provider may ask you about your medical history and use of alcohol or drugs, including prescription medicines. Your health care provider may also do a physical exam and blood tests. Certain medical conditions and the use of certain substances can cause symptoms similar to those associated with GAD. Your health care provider may refer you to a mental health specialist for further evaluation. TREATMENT The following therapies are usually used to treat GAD:   Medication. Antidepressant medication usually is prescribed for long-term daily control. Antianxiety medicines may be added in severe cases, especially when panic attacks occur.   Talk therapy (psychotherapy). Certain types of talk therapy can be helpful in treating GAD by providing support, education, and guidance. A form of talk therapy called cognitive behavioral therapy can teach you healthy ways to think about and react to daily life events and activities.  Stress managementtechniques. These include yoga, meditation, and exercise and can be very helpful when they are practiced regularly. A mental health specialist can help determine which treatment is best for you. Some people see improvement with one therapy. However,  other people require a combination of therapies.   This information is not intended to replace advice given to you by your health care provider. Make sure you discuss any questions you have with your health care provider.    Document Released: 04/18/2012 Document Revised: 01/12/2014 Document Reviewed: 04/18/2012 Elsevier Interactive Patient Education [employer]

## 2015-01-02 NOTE — Progress Notes (Signed)
Internal Medicine Clinic Attending  Case discussed with Dr. Gill soon after the resident saw the patient.  We reviewed the resident's history and exam and pertinent patient test results.  I agree with the assessment, diagnosis, and plan of care documented in the resident's note.  

## 2015-01-02 NOTE — Progress Notes (Signed)
Patient ID: Chad Fitzgerald, male   DOB: [birth year], 24 y.o.   MRN: [id number]     Subjective:   Patient ID: Chad Fitzgerald male    DOB: 21-Aug-1990 24 y.o.    MRN: [id number] Health Maintenance Due: Health Maintenance Due  Topic Date Due  . HIV Screening  04/03/2004  . TETANUS/TDAP  04/03/2008  . INFLUENZA VACCINE  08/06/2014    _________________________________________________  HPI: [name] is a 24 y.o. male here for a hospital f/u visit.  Pt has a PMH outlined below.  Please see problem-based charting assessment and plan for further status of patient's chronic medical problems addressed at today's visit.  PMH: Past Medical History  Diagnosis Date  . ADD (attention deficit disorder)   . CAD (coronary artery disease)     a. 04/28/14: acute anterolateral STEMI s/p DES to LAD  . Ischemic cardiomyopathy     a. 04/29/13: 2D ECHO: LVEF 35-40%  . Low serum HDL   . Hyperglycemia     a. 04/2014: Hg A1c 5.3  . Asthma     as a child  . Hypertension   . MI (myocardial infarction) (HCC)     Medications: Current Outpatient Prescriptions on File Prior to Visit  Medication Sig Dispense Refill  . aspirin EC 81 MG EC tablet Take 1 tablet (81 mg total) by mouth daily.    Marland Kitchen atorvastatin (LIPITOR) 80 MG tablet Take 1 tablet (80 mg total) by mouth daily at 6 PM. 30 tablet 11  . carvedilol (COREG) 3.125 MG tablet Take 1 tablet (3.125 mg total) by mouth 2 (two) times daily with a meal. 60 tablet 11  . lisinopril (PRINIVIL,ZESTRIL) 5 MG tablet Take 1 tablet (5 mg total) by mouth 2 (two) times daily. 60 tablet 11  . nitroGLYCERIN (NITROSTAT) 0.4 MG SL tablet Place 1 tablet (0.4 mg total) under the tongue every 5 (five) minutes as needed for chest pain. 25 tablet 12  . pantoprazole (PROTONIX) 40 MG tablet Take 1 tablet (40 mg total) by mouth 2 (two) times daily. 60 tablet 2  . spironolactone (ALDACTONE) 25 MG tablet Take 0.5 tablets (12.5 mg total) by mouth daily. 30 tablet 11   . ticagrelor (BRILINTA) 90 MG TABS tablet Take 1 tablet (90 mg total) by mouth 2 (two) times daily. 60 tablet 11   No current facility-administered medications on file prior to visit.    Allergies: Allergies  Allergen Reactions  . Bee Pollen Hives, Itching and Rash    FH: Family History  Problem Relation Age of Onset  . Heart attack Neg Hx   . Stroke Neg Hx   . Hypertension Father     SH: Social History   Social History  . Marital Status: Single    Spouse Name: N/A  . Number of Children: N/A  . Years of Education: N/A   Social History Main Topics  . Smoking status: Never Smoker   . Smokeless tobacco: None  . Alcohol Use: No  . Drug Use: 2.00 per week    Special: Marijuana  . Sexual Activity: Not Asked   Other Topics Concern  . None   Social History Narrative    Review of Systems: Constitutional: Negative for fever, chills and weight loss.  Eyes: Negative for blurred vision.  Respiratory: Negative for cough and shortness of breath.  Cardiovascular: +chest pain, palpitations and no leg swelling.  Gastrointestinal: Negative for nausea, vomiting, abdominal pain, diarrhea, constipation and blood in stool.  Genitourinary: Negative for dysuria, urgency  and frequency.  Musculoskeletal: Negative for myalgias and back pain.  Neurological: Negative for dizziness, weakness and headaches.     Objective:   Vital Signs: Filed Vitals:   01/02/15 1605  BP: 108/72  Pulse: 80  Temp: 98.4 F (36.9 C)  TempSrc: Oral  Height: 5\' 8"  (1.727 m)  Weight: 163 lb (73.936 kg)  SpO2: 100%      BP Readings from Last 3 Encounters:  01/02/15 108/72  12/24/14 104/83  07/06/14 127/63    Physical Exam: Constitutional: Vital signs reviewed.  Patient is in NAD and cooperative with exam.  Head: Normocephalic and atraumatic. Eyes: EOMI, conjunctivae nl, no scleral icterus.  Neck: Supple. Cardiovascular: RRR, no MRG. Pulmonary/Chest: normal effort, CTAB, no wheezes, rales,  or rhonchi. Abdominal: Soft. NT/ND +BS. Neurological: A&O x3, cranial nerves II-XII are grossly intact, moving all extremities. Extremities: 2+DP b/l; no pitting edema. Skin: Warm, dry and intact. No rash. Psychiatric: Appears anxious.    Assessment & Plan:   Assessment and plan was discussed and formulated with my attending.

## 2015-01-02 NOTE — Assessment & Plan Note (Addendum)
He was recently discharged for recurrent chest pain and underwent LHC which showed a patent stent to LAD.  It was felt his chest pain may be related to GERD but states the protonix bid is not helping.  States he continues to have chest pain that is partially relieved with NTG. -cont current meds

## 2015-01-04 ENCOUNTER — Ambulatory Visit: Payer: Self-pay | Admitting: Family Medicine

## 2015-07-10 ENCOUNTER — Ambulatory Visit (INDEPENDENT_AMBULATORY_CARE_PROVIDER_SITE_OTHER): Payer: Self-pay | Admitting: Physician Assistant

## 2015-07-10 ENCOUNTER — Ambulatory Visit (HOSPITAL_COMMUNITY)
Admission: RE | Admit: 2015-07-10 | Discharge: 2015-07-10 | Disposition: A | Discharge status: Home / Self Care | Payer: Self-pay | Source: Ambulatory Visit | Attending: Physician Assistant | Admitting: Physician Assistant

## 2015-07-10 ENCOUNTER — Telehealth: Payer: Self-pay

## 2015-07-10 VITALS — BP 118/78 | HR 86 | Temp 98.0°F | Resp 18 | Ht 68.0 in | Wt 170.0 lb

## 2015-07-10 DIAGNOSIS — R51 Headache: Secondary | ICD-10-CM

## 2015-07-10 DIAGNOSIS — Z760 Encounter for issue of repeat prescription: Secondary | ICD-10-CM

## 2015-07-10 DIAGNOSIS — R519 Headache, unspecified: Secondary | ICD-10-CM

## 2015-07-10 MED ORDER — TICAGRELOR 90 MG PO TABS: 90.0000 mg | ORAL_TABLET | Freq: Two times a day (BID) | ORAL | Status: DC

## 2015-07-10 NOTE — Telephone Encounter (Signed)
Pt was just seen today and is needing a work note for today and to be out thru Friday    Best number [phone]

## 2015-07-10 NOTE — Patient Instructions (Addendum)
Please report to [facility] Admitting now and check in.  They will escort you to Radiology.    IF you received an x-ray today, you will receive an invoice from [facility] Radiology. Please contact [facility] Radiology at [phone] with questions or concerns regarding your invoice.   IF you received labwork today, you will receive an invoice from [employer]. Please contact [facility] at [phone] with questions or concerns regarding your invoice.   Our billing staff will not be able to assist you with questions regarding bills from these companies.  You will be contacted with the lab results as soon as they are available. The fastest way to get your results is to activate your My Chart account. Instructions are located on the last page of this paperwork. If you have not heard from [place] regarding the results in 2 weeks, please contact this office.

## 2015-07-10 NOTE — Addendum Note (Signed)
Addended by: Ofilia Neas on: 07/10/2015 06:22 PM   Modules accepted: Orders

## 2015-07-10 NOTE — Progress Notes (Addendum)
07/10/2015 4:52 PM   DOB: [birth year] / MRN: [id number]  SUBJECTIVE:  Chad Fitzgerald is a 25 y.o. male with a history of prothrombin gene mutation and STEMI status post stent placement presenting for pulsatile occipital HA that started 4 days ago and resolved within five hours of the onset.  He tried tylenol and this did not help. He does associate photophobia and phobophobia with these symptoms.  He complains of loss of peripheral vision during the HA however this resolved once the HA resolved.  He has no history of migraine.     He is allergic to bee pollen.   He  has a past medical history of ADD (attention deficit disorder); CAD (coronary artery disease); Ischemic cardiomyopathy; Low serum HDL; Hyperglycemia; Asthma; Hypertension; and MI (myocardial infarction) (HCC).    He  reports that he has never smoked. He does not have any smokeless tobacco history on file. He reports that he uses illicit drugs (Marijuana) about twice per week. He reports that he does not drink alcohol. He  has no sexual activity history on file. The patient  has past surgical history that includes Appendectomy; left heart cath (N/A, 04/28/2014); and Cardiac catheterization (N/A, 12/24/2014).  His family history includes Hypertension in his father. There is no history of Heart attack or Stroke.  Review of Systems  Eyes: Positive for blurred vision (resolved).  Gastrointestinal: Negative for nausea.  Neurological: Positive for headaches. Negative for dizziness.    Problem list and medications reviewed and updated by myself where necessary, and exist elsewhere in the encounter.   OBJECTIVE:  BP 118/78 mmHg  Pulse 86  Temp(Src) 98 F (36.7 C) (Oral)  Resp 18  Ht  (1.727 m)  Wt 170 lb (77.111 kg)  BMI 25.85 kg/m2  SpO2 98%  Physical Exam  Constitutional: He is oriented to person, place, and time.  Cardiovascular: Normal rate and regular rhythm.   Pulmonary/Chest: Effort normal and breath sounds  normal.  Musculoskeletal: Normal range of motion.  Neurological: He is alert and oriented to person, place, and time. He has normal strength and normal reflexes. No cranial nerve deficit or sensory deficit. Gait normal. GCS eye subscore is 4. GCS verbal subscore is 5. GCS motor subscore is 6.  FTN, heel and toe walking, and ROM intact to challenge.       No results found.  ASSESSMENT AND PLAN  Chad Fitzgerald was seen today for blurred vision.  Diagnoses and all orders for this visit:  Occipital headache: His HPI is consistent with migraine HA and his vision complaint is likely Aura, however he is extremely high risk and needs a CT scan today to ensure that he does not need to see a neurologist emergently. I have placed an order for the CT scan stat and he will go straight from here to receive the scan.  -     CT HEAD WO CONTRAST; Future   -     ticagrelor (BRILINTA) 90 MG TABS tablet; Take 1 tablet (90 mg total) by mouth 2 (two) times daily. Please go back to your cardiologist for refills of this medication.   The patient was advised to call or return to clinic if he does not see an improvement in symptoms, or to seek the care of the closest emergency department if he worsens with the above plan.   Chad Fitzgerald, MHS, PA-C [facility] 07/10/2015 4:52 PM   Addendum: CT scan negative for anomaly.  I am going to send him to a neurologist given that this HA appears to be a migraine I am concerned about his options for medication.  Triptans can elevated BP and NSAIDS are largely contraindicated for him given his history of CAD.  He will be best served by a specialist for the treatment of migraine.

## 2015-07-10 NOTE — Progress Notes (Deleted)
07/10/2015 2:54 PM   DOB: Nov 29, 1990 / MRN: [id number]  SUBJECTIVE:  Chad Fitzgerald is a 25 y.o. male presenting for   He is allergic to bee pollen.   He  has a past medical history of ADD (attention deficit disorder); CAD (coronary artery disease); Ischemic cardiomyopathy; Low serum HDL; Hyperglycemia; Asthma; Hypertension; and MI (myocardial infarction) (HCC).    He  reports that he has never smoked. He does not have any smokeless tobacco history on file. He reports that he uses illicit drugs (Marijuana) about twice per week. He reports that he does not drink alcohol. He  has no sexual activity history on file. The patient  has past surgical history that includes Appendectomy; left heart cath (N/A, 04/28/2014); and Cardiac catheterization (N/A, 12/24/2014).  His family history includes Hypertension in his father. There is no history of Heart attack or Stroke.  ROS  Problem list and medications reviewed and updated by myself where necessary, and exist elsewhere in the encounter.   OBJECTIVE:  BP 118/78 mmHg  Pulse 86  Temp(Src) 98 F (36.7 C) (Oral)  Resp 18  Ht 5\' 8"  (1.727 m)  Wt 170 lb (77.111 kg)  BMI 25.85 kg/m2  SpO2 98%  Physical Exam  Results for orders placed or performed during the hospital encounter of 12/21/14  NM Myocar Multi W/Spect W/Wall Motion / EF  Result Value Ref Range   Estimated workload 13.4 METS   Phase 1 name PRETEST    Stage 1 Name STANDING    Stage 1 Time 00:01:32    Stage 1 Speed 0.0 mph   Stage 1 Grade 0.0 %   Stage 1 HR 69 bpm   Stage 1 SBP 108 mmHg   Stage 1 DBP 68 mmHg   Phase 2 Name PRETEST    Stage 2 Name SUPINE    Stage 2 Time 00:29:22    Stage 2 Speed 0.0 mph   Stage 2 Grade 0.0 %   Stage 2 HR 99 bpm   Phase 3 Name Manual    Stage 3 Name STAGE 3    Stage 3 Time 00:00:08    Stage 3 Speed 0.9 mph   Stage 3 Grade 0.0 %   Stage 3 HR 83 bpm   Phase 4 Name Exercise    Stage 4 Name STAGE 1    Stage 4 Attribute Baseline    Stage 4 Time 00:00:00    Stage 4 Speed 1.0 mph   Stage 4 Grade 0.1 %   Stage 4 HR 81 bpm   Phase 5 Name Exercise    Stage 5 Name STAGE 1    Stage 5 Time 00:03:00    Stage 5 Speed 1.7 mph   Stage 5 Grade 10.0 %   Stage 5 HR 85 bpm   Stage 5 SBP 115 mmHg   Stage 5 DBP 63 mmHg   Phase 6 Name Exercise    Stage 6 Name STAGE 2    Stage 6 Time 00:03:00    Stage 6 Speed 2.5 mph   Stage 6 Grade 12.0 %   Stage 6 HR 101 bpm   Stage 6 SBP 121 mmHg   Stage 6 DBP 67 mmHg   Phase 7 Name Exercise    Stage 7 Name STAGE 3    Stage 7 Time 00:03:00    Stage 7 Speed 3.4 mph   Stage 7 Grade 14.0 %   Stage 7 HR 136 bpm   Stage 7 SBP 166 mmHg  Stage 7 DBP 67 mmHg   Phase 8 Name Exercise    Stage 8 Name STAGE 4    Stage 8 Attribute Peak    Stage 8 Time 00:02:02    Stage 8 Speed 4.2 mph   Stage 8 Grade 16.0 %   Stage 8 HR 157 bpm   Phase 9 Name Recovery    Stage 9 Attribute Recovery    Stage 9 Time 00:01:00    Stage 9 Speed 0.0 mph   Stage 9 Grade 0.0 %   Stage 9 HR 131 bpm   Stage 9 SBP 129 mmHg   Stage 9 DBP 66 mmHg   Phase 10 Name Recovery    Stage 10 Time 00:03:23    Stage 10 Speed 0.0 mph   Stage 10 Grade 0.0 %   Stage 10 HR 96 bpm   Stage 10 SBP 145 mmHg   Stage 10 DBP 80 mmHg   Peak HR 157 BPM   Peak BP  mmHg   Percent of predicted max HR 80 %  Troponin I  Result Value Ref Range   Troponin I <0.03 <0.031 ng/mL  CBC with Differential  Result Value Ref Range   WBC 9.6 4.0 - 10.5 K/uL   RBC 5.43 4.22 - 5.81 MIL/uL   Hemoglobin 16.4 13.0 - 17.0 g/dL   HCT 66.0 63.0 - 16.0 %   MCV 85.1 78.0 - 100.0 fL   MCH 30.2 26.0 - 34.0 pg   MCHC 35.5 30.0 - 36.0 g/dL   RDW 10.9 32.3 - 55.7 %   Platelets 307 150 - 400 K/uL   Neutrophils Relative % 51 %   Neutro Abs 5.0 1.7 - 7.7 K/uL   Lymphocytes Relative 35 %   Lymphs Abs 3.4 0.7 - 4.0 K/uL   Monocytes Relative 11 %   Monocytes Absolute 1.0 0.1 - 1.0 K/uL   Eosinophils Relative 2 %   Eosinophils Absolute 0.2 0.0 - 0.7  K/uL   Basophils Relative 1 %   Basophils Absolute 0.1 0.0 - 0.1 K/uL  Basic metabolic panel  Result Value Ref Range   Sodium 138 135 - 145 mmol/L   Potassium 4.1 3.5 - 5.1 mmol/L   Chloride 107 101 - 111 mmol/L   CO2 22 22 - 32 mmol/L   Glucose, Bld 97 65 - 99 mg/dL   BUN 12 6 - 20 mg/dL   Creatinine, Ser 3.22 0.61 - 1.24 mg/dL   Calcium 9.5 8.9 - 02.5 mg/dL   GFR calc non Af Amer >60 >60 mL/min   GFR calc Af Amer >60 >60 mL/min   Anion gap 9 5 - 15  Urine rapid drug screen (hosp performed)  Result Value Ref Range   Opiates NONE DETECTED NONE DETECTED   Cocaine NONE DETECTED NONE DETECTED   Benzodiazepines NONE DETECTED NONE DETECTED   Amphetamines NONE DETECTED NONE DETECTED   Tetrahydrocannabinol POSITIVE (A) NONE DETECTED   Barbiturates POSITIVE (A) NONE DETECTED  Lipid panel  Result Value Ref Range   Cholesterol 119 0 - 200 mg/dL   Triglycerides 27 <427 mg/dL   HDL 40 (L) >06 mg/dL   Total CHOL/HDL Ratio 3.0 RATIO   VLDL 5 0 - 40 mg/dL   LDL Cholesterol 74 0 - 99 mg/dL  Hemoglobin C3J  Result Value Ref Range   Hgb A1c MFr Bld 5.8 (H) 4.8 - 5.6 %   Mean Plasma Glucose 120 mg/dL  Troponin I-serum (0, 3, 6 hours)  Result  Value Ref Range   Troponin I <0.03 <0.031 ng/mL  Troponin I-serum (0, 3, 6 hours)  Result Value Ref Range   Troponin I <0.03 <0.031 ng/mL  Troponin I-serum (0, 3, 6 hours)  Result Value Ref Range   Troponin I <0.03 <0.031 ng/mL  Basic metabolic panel  Result Value Ref Range   Sodium 137 135 - 145 mmol/L   Potassium 4.2 3.5 - 5.1 mmol/L   Chloride 106 101 - 111 mmol/L   CO2 24 22 - 32 mmol/L   Glucose, Bld 91 65 - 99 mg/dL   BUN 12 6 - 20 mg/dL   Creatinine, Ser 1.61 0.61 - 1.24 mg/dL   Calcium 9.1 8.9 - 09.6 mg/dL   GFR calc non Af Amer >60 >60 mL/min   GFR calc Af Amer >60 >60 mL/min   Anion gap 7 5 - 15  Cardiolipin antibodies, IgG, IgM, IgA  Result Value Ref Range   Anticardiolipin IgG <9 0 - 14 GPL U/mL   Anticardiolipin IgM <9 0 -  12 MPL U/mL   Anticardiolipin IgA <9 0 - 11 APL U/mL  Beta-2-glycoprotein i abs, IgG/M/A  Result Value Ref Range   Beta-2 Glyco I IgG <9 0 - 20 GPI IgG units   Beta-2-Glycoprotein I IgM <9 0 - 32 GPI IgM units   Beta-2-Glycoprotein I IgA <9 0 - 25 GPI IgA units  Protime-INR  Result Value Ref Range   Prothrombin Time 13.6 11.6 - 15.2 seconds   INR 1.02 0.00 - 1.49  Basic metabolic panel  Result Value Ref Range   Sodium 137 135 - 145 mmol/L   Potassium 4.0 3.5 - 5.1 mmol/L   Chloride 109 101 - 111 mmol/L   CO2 21 (L) 22 - 32 mmol/L   Glucose, Bld 89 65 - 99 mg/dL   BUN 11 6 - 20 mg/dL   Creatinine, Ser 0.45 0.61 - 1.24 mg/dL   Calcium 9.3 8.9 - 40.9 mg/dL   GFR calc non Af Amer >60 >60 mL/min   GFR calc Af Amer >60 >60 mL/min   Anion gap 7 5 - 15  Magnesium  Result Value Ref Range   Magnesium 1.9 1.7 - 2.4 mg/dL  Troponin I  Result Value Ref Range   Troponin I <0.03 <0.031 ng/mL  Troponin I  Result Value Ref Range   Troponin I <0.03 <0.031 ng/mL  CBC  Result Value Ref Range   WBC 9.2 4.0 - 10.5 K/uL   RBC 5.30 4.22 - 5.81 MIL/uL   Hemoglobin 15.9 13.0 - 17.0 g/dL   HCT 81.1 91.4 - 78.2 %   MCV 86.8 78.0 - 100.0 fL   MCH 30.0 26.0 - 34.0 pg   MCHC 34.6 30.0 - 36.0 g/dL   RDW 95.6 21.3 - 08.6 %   Platelets 257 150 - 400 K/uL  Heparin level (unfractionated)  Result Value Ref Range   Heparin Unfractionated 0.70 0.30 - 0.70 IU/mL  I-stat troponin, ED  Result Value Ref Range   Troponin i, poc 0.01 0.00 - 0.08 ng/mL   Comment 3          POCT Activated clotting time  Result Value Ref Range   Activated Clotting Time 132 seconds    No results found.  ASSESSMENT AND PLAN  There are no diagnoses linked to this encounter.  The patient was advised to call or return to clinic if he does not see an improvement in symptoms, or to seek the care of  the closest emergency department if he worsens with the above plan.   Deliah Boston, MHS, PA-C [facility] 07/10/2015 2:54 PM

## 2015-07-11 ENCOUNTER — Ambulatory Visit (HOSPITAL_COMMUNITY): Payer: Self-pay

## 2015-07-11 NOTE — Telephone Encounter (Signed)
Ok for note 

## 2015-07-11 NOTE — Telephone Encounter (Signed)
Pt advised note ready to pick up.

## 2015-07-11 NOTE — Telephone Encounter (Signed)
Please compose note per patient's request. Deliah Boston, MS, PA-C 12:00 PM, 07/11/2015

## 2015-07-11 NOTE — Telephone Encounter (Signed)
Left message for pt to call back., 

## 2015-08-05 ENCOUNTER — Encounter: Payer: Self-pay | Admitting: Family Medicine

## 2015-08-05 ENCOUNTER — Ambulatory Visit (INDEPENDENT_AMBULATORY_CARE_PROVIDER_SITE_OTHER): Payer: Self-pay | Admitting: Family Medicine

## 2015-08-05 VITALS — BP 127/88 | HR 57 | Temp 98.5°F | Resp 16 | Ht 69.0 in | Wt 170.0 lb

## 2015-08-05 DIAGNOSIS — I10 Essential (primary) hypertension: Secondary | ICD-10-CM

## 2015-08-05 DIAGNOSIS — Z760 Encounter for issue of repeat prescription: Secondary | ICD-10-CM

## 2015-08-05 DIAGNOSIS — I2510 Atherosclerotic heart disease of native coronary artery without angina pectoris: Secondary | ICD-10-CM

## 2015-08-05 LAB — COMPLETE METABOLIC PANEL WITH GFR
ALK PHOS: 56 U/L (ref 40–115)
ALT: 20 U/L (ref 9–46)
AST: 16 U/L (ref 10–40)
Albumin: 4.6 g/dL (ref 3.6–5.1)
BUN: 16 mg/dL (ref 7–25)
CALCIUM: 9.4 mg/dL (ref 8.6–10.3)
CHLORIDE: 106 mmol/L (ref 98–110)
CO2: 21 mmol/L (ref 20–31)
Creat: 0.9 mg/dL (ref 0.60–1.35)
Glucose, Bld: 84 mg/dL (ref 65–99)
POTASSIUM: 4.5 mmol/L (ref 3.5–5.3)
Sodium: 137 mmol/L (ref 135–146)
Total Bilirubin: 0.6 mg/dL (ref 0.2–1.2)
Total Protein: 7.2 g/dL (ref 6.1–8.1)

## 2015-08-05 LAB — CBC WITH DIFFERENTIAL/PLATELET
BASOS ABS: 92 {cells}/uL (ref 0–200)
Basophils Relative: 1 %
EOS ABS: 184 {cells}/uL (ref 15–500)
Eosinophils Relative: 2 %
HEMATOCRIT: 51.3 % — AB (ref 38.5–50.0)
Hemoglobin: 17.7 g/dL — ABNORMAL HIGH (ref 13.2–17.1)
LYMPHS PCT: 31 %
Lymphs Abs: 2852 cells/uL (ref 850–3900)
MCH: 30.2 pg (ref 27.0–33.0)
MCHC: 34.5 g/dL (ref 32.0–36.0)
MCV: 87.5 fL (ref 80.0–100.0)
MONO ABS: 644 {cells}/uL (ref 200–950)
MPV: 9.2 fL (ref 7.5–12.5)
Monocytes Relative: 7 %
NEUTROS PCT: 59 %
Neutro Abs: 5428 cells/uL (ref 1500–7800)
Platelets: 310 10*3/uL (ref 140–400)
RBC: 5.86 MIL/uL — AB (ref 4.20–5.80)
RDW: 13.7 % (ref 11.0–15.0)
WBC: 9.2 10*3/uL (ref 3.8–10.8)

## 2015-08-05 LAB — LIPID PANEL
CHOL/HDL RATIO: 4.3 ratio (ref <=5.0)
CHOLESTEROL: 176 mg/dL (ref 125–200)
HDL: 41 mg/dL (ref >=40)
LDL Cholesterol: 105 mg/dL (ref <130)
TRIGLYCERIDES: 148 mg/dL (ref <150)
VLDL: 30 mg/dL (ref <30)

## 2015-08-05 MED ORDER — TICAGRELOR 90 MG PO TABS: 90.0000 mg | ORAL_TABLET | Freq: Two times a day (BID) | ORAL | 0 refills | Status: DC

## 2015-08-05 MED ORDER — CARVEDILOL 3.125 MG PO TABS: 3.1250 mg | ORAL_TABLET | Freq: Two times a day (BID) | ORAL | 11 refills | Status: DC

## 2015-08-06 NOTE — Progress Notes (Signed)
Chad Fitzgerald, is a 25 y.o. male  QMV:[id number]  MWU:[id number]  DOB - [birth year]  CC:  Chief Complaint  Patient presents with  . Establish Care  . Medication Refill       HPI: Chad Fitzgerald is a 25 y.o. male here to establish care. He has a history of CAD with MI in 2016. He also has a history of asthma, ischemic cardiomyopathy and hypertension. He has a history of a protein gene mutation. He does not currently have a cardiologist.He has a stent in place. He is here to establish care and needing refills on his medications. He has not had any medications in several months. His previous medications include Brillinta, aldactone, carvedilol, lisinopril, lipitor and celexa. He does not have funds to afford all of these. He request a refill on Brillinta. His BP is within normal limits w/o medications. He denies smoking, uses occ alcohol.   Allergies  Allergen Reactions  . Bee Pollen Hives, Itching and Rash   Past Medical History:  Diagnosis Date  . ADD (attention deficit disorder)   . Asthma    as a child  . CAD (coronary artery disease)    a. 04/28/14: acute anterolateral STEMI s/p DES to LAD  . Hyperglycemia    a. 04/2014: Hg A1c 5.3  . Hypertension   . Ischemic cardiomyopathy    a. 04/29/13: 2D ECHO: LVEF 35-40%  . Low serum HDL   . MI (myocardial infarction) [facility])    Current Outpatient Prescriptions on File Prior to Visit  Medication Sig Dispense Refill  . aspirin EC 81 MG EC tablet Take 1 tablet (81 mg total) by mouth daily.    . nitroGLYCERIN (NITROSTAT) 0.4 MG SL tablet Place 1 tablet (0.4 mg total) under the tongue every 5 (five) minutes as needed for chest pain. 25 tablet 12  . atorvastatin (LIPITOR) 80 MG tablet Take 1 tablet (80 mg total) by mouth daily at 6 PM. (Patient not taking: Reported on 07/10/2015) 30 tablet 11  . citalopram (CELEXA) 10 MG tablet Take 1 tablet (10 mg total) by mouth daily. (Patient not taking: Reported on 07/10/2015) 30 tablet 1  .  lisinopril (PRINIVIL,ZESTRIL) 5 MG tablet Take 1 tablet (5 mg total) by mouth 2 (two) times daily. (Patient not taking: Reported on 07/10/2015) 60 tablet 11  . pantoprazole (PROTONIX) 40 MG tablet Take 1 tablet (40 mg total) by mouth 2 (two) times daily. (Patient not taking: Reported on 07/10/2015) 60 tablet 2  . spironolactone (ALDACTONE) 25 MG tablet Take 0.5 tablets (12.5 mg total) by mouth daily. (Patient not taking: Reported on 07/10/2015) 30 tablet 11   No current facility-administered medications on file prior to visit.    Family History  Problem Relation Age of Onset  . Hypertension Father   . Heart attack Neg Hx   . Stroke Neg Hx    Social History   Social History  . Marital status: Single    Spouse name: N/A  . Number of children: N/A  . Years of education: N/A   Occupational History  . Not on file.   Social History Main Topics  . Smoking status: Former Smoker    Quit date: 2011  . Smokeless tobacco: Never Used  . Alcohol use Yes     Comment: occ  . Drug use: No  . Sexual activity: Not on file   Other Topics Concern  . Not on file   Social History Narrative  . No narrative on file  Review of Systems: Constitutional: Negative for fever, chills, appetite change, weight loss,  Fatigue. Skin: Negative for rashes or lesions of concern. HENT: Negative for ear pain, ear discharge.nose bleeds Eyes: Negative for pain, discharge, redness, itching and visual disturbance. Neck: Negative for pain, stiffness Respiratory: Negative for cough, shortness of breath,   Cardiovascular: Negative for chest pain, palpitations and leg swelling. Occ has non-specific CP lasting 1-2 minutes Gastrointestinal: Negative for abdominal pain, nausea, vomiting, diarrhea, constipations. Positive for occ heartburn Genitourinary: Negative for dysuria, urgency, frequency, hematuria,  Musculoskeletal: Negative for back pain, joint pain, joint  swelling, and gait problem.Negative for  weakness. Neurological: Negative for dizziness, tremors, seizures, syncope,   light-headedness, numbness. +occ headaches Hematological: Negative for easy bruising or bleeding Psychiatric/Behavioral:Positive for depression, anxiety.  Objective:   Vitals:   08/05/15 1159  BP: 127/88  Pulse: (!) 57  Resp: 16  Temp: 98.5 F (36.9 C)    Physical Exam: Constitutional: Patient appears well-developed and well-nourished. No distress. HENT: Normocephalic, atraumatic, External right and left ear normal. Oropharynx is clear and moist.  Eyes: Conjunctivae and EOM are normal. PERRLA, no scleral icterus. Neck: Normal ROM. Neck supple. No lymphadenopathy, No thyromegaly. CVS: RRR, S1/S2 +, no murmurs, no gallops, no rubs Pulmonary: Effort and breath sounds normal, no stridor, rhonchi, wheezes, rales.  Abdominal: Soft. Normoactive BS,, no distension, tenderness, rebound or guarding.  Musculoskeletal: Normal range of motion. No edema and no tenderness.  Neuro: Alert.Normal muscle tone coordination. Non-focal Skin: Skin is warm and dry. No rash noted. Not diaphoretic. No erythema. No pallor. Psychiatric: Normal mood and affect. Behavior, judgment, thought content normal.  Lab Results  Component Value Date   WBC 9.2 08/05/2015   HGB 17.7 (H) 08/05/2015   HCT 51.3 (H) 08/05/2015   MCV 87.5 08/05/2015   PLT 310 08/05/2015   Lab Results  Component Value Date   CREATININE 0.90 08/05/2015   BUN 16 08/05/2015   NA 137 08/05/2015   K 4.5 08/05/2015   CL 106 08/05/2015   CO2 21 08/05/2015    Lab Results  Component Value Date   HGBA1C 5.8 (H) 12/21/2014   Lipid Panel     Component Value Date/Time   CHOL 176 08/05/2015 1233   TRIG 148 08/05/2015 1233   HDL 41 08/05/2015 1233   CHOLHDL 4.3 08/05/2015 1233   VLDL 30 08/05/2015 1233   LDLCALC 105 08/05/2015 1233       Assessment and plan:   1. Medication refill  - ticagrelor (BRILINTA) 90 MG TABS tablet; Take 1 tablet (90 mg total)  by mouth 2 (two) times daily. Please go back to your cardiologist for refills of this medication.  Dispense: 60 tablet; Refill: 0  2. Essential hypertension  - CBC with Differential - COMPLETE METABOLIC PANEL WITH GFR - Lipid panel  3. Coronary artery disease involving native coronary artery of native heart without angina pectoris  - Ambulatory referral to Cardiology   Return in about 3 months (around 11/05/2015).  The patient was given clear instructions to go to ER or return to medical center if symptoms don't improve, worsen or new problems develop. The patient verbalized understanding.    Henrietta Hoover FNP  08/06/2015, 10:39 AM

## 2015-08-12 ENCOUNTER — Ambulatory Visit: Payer: Self-pay

## 2015-08-23 ENCOUNTER — Ambulatory Visit: Payer: Self-pay | Attending: Internal Medicine

## 2016-01-12 ENCOUNTER — Other Ambulatory Visit: Payer: Self-pay | Admitting: Internal Medicine

## 2016-05-05 ENCOUNTER — Encounter (HOSPITAL_COMMUNITY): Payer: Self-pay | Admitting: Emergency Medicine

## 2016-05-05 ENCOUNTER — Emergency Department (HOSPITAL_COMMUNITY): Payer: Self-pay

## 2016-05-05 ENCOUNTER — Emergency Department (HOSPITAL_COMMUNITY)
Admission: EM | Admit: 2016-05-05 | Discharge: 2016-05-05 | Disposition: A | Discharge status: Home / Self Care | Payer: Self-pay | Attending: Emergency Medicine | Admitting: Emergency Medicine

## 2016-05-05 DIAGNOSIS — J45909 Unspecified asthma, uncomplicated: Secondary | ICD-10-CM | POA: Insufficient documentation

## 2016-05-05 DIAGNOSIS — I2510 Atherosclerotic heart disease of native coronary artery without angina pectoris: Secondary | ICD-10-CM | POA: Insufficient documentation

## 2016-05-05 DIAGNOSIS — Z79899 Other long term (current) drug therapy: Secondary | ICD-10-CM | POA: Insufficient documentation

## 2016-05-05 DIAGNOSIS — Z7982 Long term (current) use of aspirin: Secondary | ICD-10-CM | POA: Insufficient documentation

## 2016-05-05 DIAGNOSIS — Z87891 Personal history of nicotine dependence: Secondary | ICD-10-CM | POA: Insufficient documentation

## 2016-05-05 DIAGNOSIS — R072 Precordial pain: Secondary | ICD-10-CM

## 2016-05-05 DIAGNOSIS — I252 Old myocardial infarction: Secondary | ICD-10-CM | POA: Insufficient documentation

## 2016-05-05 DIAGNOSIS — I10 Essential (primary) hypertension: Secondary | ICD-10-CM | POA: Insufficient documentation

## 2016-05-05 DIAGNOSIS — F909 Attention-deficit hyperactivity disorder, unspecified type: Secondary | ICD-10-CM | POA: Insufficient documentation

## 2016-05-05 LAB — BASIC METABOLIC PANEL
Anion gap: 10 (ref 5–15)
BUN: 9 mg/dL (ref 6–20)
CALCIUM: 9.7 mg/dL (ref 8.9–10.3)
CO2: 23 mmol/L (ref 22–32)
CREATININE: 0.92 mg/dL (ref 0.61–1.24)
Chloride: 105 mmol/L (ref 101–111)
GFR calc non Af Amer: >60 mL/min (ref >60)
GLUCOSE: 92 mg/dL (ref 65–99)
Potassium: 3.9 mmol/L (ref 3.5–5.1)
Sodium: 138 mmol/L (ref 135–145)

## 2016-05-05 LAB — CBC
HCT: 48.4 % (ref 39.0–52.0)
Hemoglobin: 17.5 g/dL — ABNORMAL HIGH (ref 13.0–17.0)
MCH: 30.3 pg (ref 26.0–34.0)
MCHC: 36.2 g/dL — AB (ref 30.0–36.0)
MCV: 83.7 fL (ref 78.0–100.0)
Platelets: 318 10*3/uL (ref 150–400)
RBC: 5.78 MIL/uL (ref 4.22–5.81)
RDW: 12.9 % (ref 11.5–15.5)
WBC: 11.2 10*3/uL — ABNORMAL HIGH (ref 4.0–10.5)

## 2016-05-05 LAB — I-STAT TROPONIN, ED
TROPONIN I, POC: 0 ng/mL (ref 0.00–0.08)
Troponin i, poc: 0 ng/mL (ref 0.00–0.08)

## 2016-05-05 MED ORDER — FAMOTIDINE IN NACL 20-0.9 MG/50ML-% IV SOLN
20.0000 mg | Freq: Once | INTRAVENOUS | Status: AC
  Administered 2016-05-05: 20 mg via INTRAVENOUS
  Filled 2016-05-05: qty 50

## 2016-05-05 MED ORDER — NITROGLYCERIN 2 % TD OINT
1.0000 [in_us] | TOPICAL_OINTMENT | Freq: Four times a day (QID) | TRANSDERMAL | Status: DC
  Administered 2016-05-05: 1 [in_us] via TOPICAL
  Filled 2016-05-05: qty 1

## 2016-05-05 MED ORDER — ATORVASTATIN CALCIUM 40 MG PO TABS: 40.0000 mg | ORAL_TABLET | Freq: Every day | ORAL | 6 refills | Status: DC

## 2016-05-05 MED ORDER — ASPIRIN 81 MG PO CHEW
324.0000 mg | CHEWABLE_TABLET | Freq: Once | ORAL | Status: AC
  Administered 2016-05-05: 324 mg via ORAL
  Filled 2016-05-05: qty 4

## 2016-05-05 MED ORDER — GI COCKTAIL ~~LOC~~
30.0000 mL | Freq: Once | ORAL | Status: AC
  Administered 2016-05-05: 30 mL via ORAL
  Filled 2016-05-05: qty 30

## 2016-05-05 MED ORDER — MORPHINE SULFATE (PF) 4 MG/ML IV SOLN
4.0000 mg | Freq: Once | INTRAVENOUS | Status: AC
  Administered 2016-05-05: 4 mg via INTRAVENOUS
  Filled 2016-05-05: qty 1

## 2016-05-05 MED ORDER — ONDANSETRON HCL 4 MG/2ML IJ SOLN
4.0000 mg | Freq: Once | INTRAMUSCULAR | Status: AC
  Administered 2016-05-05: 4 mg via INTRAVENOUS
  Filled 2016-05-05: qty 2

## 2016-05-05 MED ORDER — CARVEDILOL 3.125 MG PO TABS: 3.1250 mg | ORAL_TABLET | Freq: Two times a day (BID) | ORAL | 6 refills | Status: DC

## 2016-05-05 NOTE — ED Notes (Addendum)
I-stat troponin sample brought over to mini lab for processing

## 2016-05-05 NOTE — ED Notes (Signed)
Pt c/o abdominal burning pain ER MD made aware orders received and carried out

## 2016-05-05 NOTE — ED Notes (Signed)
Pt c/o increasing pain to top of abdomen pt describes pain as a burning sensation that comes in waves, repeated ekg and showed ER MD new orders received and carried out

## 2016-05-05 NOTE — Consult Note (Signed)
CARDIOLOGY CONSULT NOTE   Patient ID: Chad Fitzgerald MRN: [id number] DOB/AGE: 06/10/1990 26 y.o.  Admit date: 05/05/2016  Primary Physician   Concepcion Living, NP Primary Cardiologist   Dr. Allyson Sabal  (never followed up in clinic) Reason for Consultation   Chest pain  Requesting Physician  Dr. Lynelle Doctor  HPI: Chad Fitzgerald is a 26 y.o. male with a history of CAD s/p DES to LAD 04/2014,  Protein gene mutation, ischemic cardiomyopathy, and hypertension presented to ER for evaluation of chest pain and cardiology is consulted by Dr. Lynelle Doctor.  History of an anterolateral ST elevation MI in April 2016. He had a drug-eluting stent placed to his LAD. Last seen 12/2014 when he was admitted with chest pain similar to his prior MI. Stress test was intermediate risk. Follow-up cath showed patent stent. Last echo on 12/21/2014 EF of 35%-40%.  Seems he stopped all of his medications except ASA 81mg  qd after he run out of refills in early 2017. Seen by internal medicine MD 07/2015 for refills --> only given Brillinta for 30 days. He took intermittently but continued to take aspirin 81 mg.  No regular cardiology or PCP follow-up. 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 He was in usual state of health up until this morning around 5:00am when he had a sudden onset sharp chest pain with burning sensation. He was studying for his finals. He did drink energy drink "red bull "at 10:30 PM last night. His pain intermittently lasted for 2-3 hours leading to ER presentation. He had a episode of shortness of breath while checking in. His symptoms completely resolved after aspirin 324 mg, GI cocktail, morphine and nitroglycerin patch.  EKG shows normal sinus rhythm without acute abnormality. Chest x-ray clear. Point-of-care troponin x 2 negative. His pain is somewhat similar to his prior MI in 2016. Denies tobacco abuse or illicit drug use.  Past Medical History:    Diagnosis Date  . ADD (attention deficit disorder)   . Asthma    as a child  . CAD (coronary artery disease)    a. 04/28/14: acute anterolateral STEMI s/p DES to LAD  . Hyperglycemia    a. 04/2014: Hg A1c 5.3  . Hypertension   . Ischemic cardiomyopathy    a. 04/29/13: 2D ECHO: LVEF 35-40%  . Low serum HDL   . MI (myocardial infarction) [facility])      Past Surgical History:  Procedure Laterality Date  . APPENDECTOMY    . CARDIAC CATHETERIZATION N/A 12/24/2014   Procedure: Left Heart Cath and Coronary Angiography;  Surgeon: Dolores Patty, MD;  Location[facility] INVASIVE CV LAB;  Service: Cardiovascular;  Laterality: N/A;  . LEFT HEART CATH N/A 04/28/2014   Procedure: LEFT HEART CATH;  Surgeon: Runell Gess, MD;  Location: [facility] CATH LAB;  Service: Cardiovascular;  Laterality: N/A;    Allergies  Allergen Reactions  . Bee Pollen Hives, Itching and Rash    I have reviewed the patient's current medications . nitroGLYCERIN  1 inch Topical Q6H      Prior to Admission medications   Medication Sig Start Date End Date Taking? Authorizing Provider  aspirin EC 81 MG EC tablet Take 1 tablet (81 mg total) by mouth daily. Patient taking differently: Take 162 mg by mouth once.  05/01/14  Yes Janetta Hora, PA-C  ticagrelor (BRILINTA) 90 MG TABS tablet Take 1 tablet (90 mg total) by mouth 2 (two) times daily. Please go back to  your cardiologist for refills of this medication. 08/05/15  Yes Henrietta Hoover, NP  atorvastatin (LIPITOR) 80 MG tablet Take 1 tablet (80 mg total) by mouth daily at 6 PM. Patient not taking: Reported on 07/10/2015 05/01/14   Janetta Hora, PA-C  carvedilol (COREG) 3.125 MG tablet Take 1 tablet (3.125 mg total) by mouth 2 (two) times daily with a meal. Patient not taking: Reported on 05/05/2016 08/05/15   Henrietta Hoover, NP  citalopram (CELEXA) 10 MG tablet Take 1 tablet (10 mg total) by mouth daily. Patient not taking: Reported on 07/10/2015 01/02/15 01/02/16   Marrian Salvage, MD  lisinopril (PRINIVIL,ZESTRIL) 5 MG tablet Take 1 tablet (5 mg total) by mouth 2 (two) times daily. Patient not taking: Reported on 07/10/2015 05/01/14   Janetta Hora, PA-C  nitroGLYCERIN (NITROSTAT) 0.4 MG SL tablet Place 1 tablet (0.4 mg total) under the tongue every 5 (five) minutes as needed for chest pain. 05/01/14   Janetta Hora, PA-C  pantoprazole (PROTONIX) 40 MG tablet Take 1 tablet (40 mg total) by mouth 2 (two) times daily. Patient not taking: Reported on 07/10/2015 12/24/14   Valentino Nose, MD  spironolactone (ALDACTONE) 25 MG tablet Take 0.5 tablets (12.5 mg total) by mouth daily. Patient not taking: Reported on 07/10/2015 05/01/14   Janetta Hora, PA-C     Social History   Social History  . Marital status: Single    Spouse name: N/A  . Number of children: N/A  . Years of education: N/A   Occupational History  . Not on file.   Social History Main Topics  . Smoking status: Former Smoker    Quit date: 2011  . Smokeless tobacco: Never Used  . Alcohol use Yes     Comment: occ  . Drug use: No  . Sexual activity: Not on file   Other Topics Concern  . Not on file   Social History Narrative  . No narrative on file    Family Status  Relation Status  . Mother Alive  . Father Alive  . Neg Hx    Family History  Problem Relation Age of Onset  . Hypertension Father   . Heart attack Neg Hx   . Stroke Neg Hx       ROS:  Full 14 point review of systems complete and found to be negative unless listed above.  Physical Exam: Blood pressure 117/72, pulse (!) 58, temperature 98 F (36.7 C), temperature source Oral, resp. rate 12, height 5\' 9"  (1.753 m), weight 165 lb (74.8 kg), SpO2 96 %.  General: Well developed, well nourished, male in no acute distress Head: Eyes PERRLA, No xanthomas. Normocephalic and atraumatic, oropharynx without edema or exudate.  Lungs: Resp regular and unlabored, CTA. Heart: RRR no s3, s4, or murmurs..   Neck:  No carotid bruits. No lymphadenopathy. No  JVD. Abdomen: Bowel sounds present, abdomen soft and non-tender without masses or hernias noted. Msk:  No spine or cva tenderness. No weakness, no joint deformities or effusions. Extremities: No clubbing, cyanosis or edema. DP/PT/Radials 2+ and equal bilaterally. Neuro: Alert and oriented X 3. No focal deficits noted. Psych:  Good affect, responds appropriately Skin: No rashes or lesions noted.  Labs:   Lab Results  Component Value Date   WBC 11.2 (H) 05/05/2016   HGB 17.5 (H) 05/05/2016   HCT 48.4 05/05/2016   MCV 83.7 05/05/2016   PLT 318 05/05/2016   No results for input(s): INR in the last  72 hours.  Recent Labs Lab 05/05/16 0705  NA 138  K 3.9  CL 105  CO2 23  BUN 9  CREATININE 0.92  CALCIUM 9.7  GLUCOSE 92   Magnesium  Date Value Ref Range Status  12/24/2014 1.9 1.7 - 2.4 mg/dL Final   No results for input(s): CKTOTAL, CKMB, TROPONINI in the last 72 hours.  Recent Labs  05/05/16 0720 05/05/16 0937  TROPIPOC 0.00 0.00   No results found for: PROBNP Lab Results  Component Value Date   CHOL 176 08/05/2015   HDL 41 08/05/2015   LDLCALC 105 08/05/2015   TRIG 148 08/05/2015   Cath 12/21/2014 Left Heart Cath and Coronary Angiography  Conclusion   Assessment:  1) LAD stent widely patent 2) Otherwise normal coronaries  3) LVEDP 5mm HG 4) ICM with EF 35-40% by echo   Plan/Discussion:  His LAD stent is widely patent. Otherwise normal coronaries. Continue aggressive RF modification. Home today if post-cath course remains uncomplicated.      Echo: 12/21/14 Study Conclusions  - Left ventricle: LVEF is approximately 35 to 40% with akinesis of   the distal septal,, distal inferior and apical walls The cavity   size was normal. Wall thickness was normal.   Radiology:  Dg Chest 2 View  Result Date: 05/05/2016 CLINICAL DATA:  Left-sided chest pressure. EXAM: CHEST  2 VIEW COMPARISON:  12/21/2014.   04/28/2014. FINDINGS: Mediastinum and hilar structures normal. Lungs are clear. No focal infiltrate. Tiny calcified left apical nodule most likely granuloma. No pleural effusion or pneumothorax. Degenerative changes thoracic spine. IMPRESSION: No acute cardiopulmonary disease. Electronically Signed   By: Maisie Fus  Register   On: 05/05/2016 07:27    ASSESSMENT AND PLAN:     1. Chest pain with hx of CAD s/p DES to LAD 04/2014 - Last cath 12/2014 showed a patent LAD stent, otherwise normal coronaries. Seems he took ASA and Brillinta for 1 years. Continued ASA 81mg .  - His symptoms is somewhat similar to his prior angina. Now completely resolved after GI cocktail, morphine and nitroglycerin. Unsure which medication help it. He is walking and going up and down of stair at [facility] without any angina or dyspnea. - Likely his symptoms is related to GERD given awake all night without any food. He only had an energy drink. EKG and enzymes x 2 are reassuring. He will be discharge from ER on aspirin 81 mg, Lipitor 40mg  and Coreg 3.125 mg. He does not have any insurance. Case manager to help established care in community clinic and with medications. Will need lipid panel and LFT as outpatient. Follow up with me in 2 weeks.   2. ICM - Last echo 12/2014 persisted LV EF of 35-40% (similar EF in 04/2014)  with akinesis of the distal septal,, distal inferior and apical walls The cavity size was normal.  - Seems he stopped his coreg, lisinopril and spironolactone early 2017. No sign of CHF.    SignedManson Passey, PA 05/05/2016, 10:26 AM   Co-Sign MD  Patient seen, examined. Available data reviewed. Agree with findings, assessment, and plan as outlined by Chelsea Aus, PA. Pt is independently interviewed and examined. He is A&Ox4, NAD, lungs CTA, heart RRR no murmur, abd soft NT, extremities with no edema. Trop (-) x 2, EKG shows NSR with age-indeterminate anterior infarct, no acute ST-T changes.   The  patient had chest pain this am after staying up all night studying and drinking energy drinks. There is no objective evidence of  ischemia/infarction. Symptoms have completely resolved. I carefully reviewed his hx of acute MI/LAD stenting followed later by repeat angiography in December 2016 demonstrating wide patency at the stent site. The patient has been lost to follow-up. He hasn't taken any medicine except aspirin in several months. I do not think he needs further ischemic evaluation at this point. His symptoms are likely noncardiac. We discussed the fact that he really needs to understand his condition is lifelong and requires medical follow-up and regular medication. He did not feel well when he was taking multiple medicines. Recommended ASA 81 mg, carvedilol 3.215 mg BID, and atorvastatin 40 mg daily. Will arrange cardiology FU and stressed the importance of keeping FU appts. Will write for him to stay out of school today and tomorrow.   Tonny Bollman, M.D. 05/05/2016 11:34 AM

## 2016-05-05 NOTE — Discharge Instructions (Signed)
Please make sure to take the medications that were prescribed to you, follow-up in the cardiology clinic as listed

## 2016-05-05 NOTE — ED Triage Notes (Signed)
Pt reports left sided chest pressure that started 5am as he was "pulling an all-nighter and drank a redbull" which he usually does not drink caffeine.  Hx of stint about two years ago.  Denies n/v, diaphoresis, lightheaded.

## 2016-05-05 NOTE — ED Notes (Signed)
Cardiology at bedside.

## 2016-05-05 NOTE — ED Provider Notes (Signed)
[facility]-EMERGENCY DEPT Provider Note   CSN: [id number] Arrival date & time: 05/05/16  7425     History   Chief Complaint Chief Complaint  Patient presents with  . Chest Pain    HPI Chad Fitzgerald is a 26 y.o. male.  HPI Pt was up all last night studying.  He drank an energy drink.  Around 5 am he started having a sharp pain in the left chest.  It does not radiate.  He felt short of breath.  He also felt very stressed.  The shortness of breath has resolved but he still has pain.  Pt has history of abnormal clotting function.  He has history of CAD and MI with stent placement  Approximately 2 years ago.  Patient states he stopped taking his medications other than an aspirin because he has felt fine. He has had episodes of chest pain following his STEMI but it was on the right side and attributed to GERD. This is the first time he has had left-sided chest pain since his heart attack 2 years ago.  Currently the pain is a 3/10  Past Medical History:  Diagnosis Date  . ADD (attention deficit disorder)   . Asthma    as a child  . CAD (coronary artery disease)    a. 04/28/14: acute anterolateral STEMI s/p DES to LAD  . Hyperglycemia    a. 04/2014: Hg A1c 5.3  . Hypertension   . Ischemic cardiomyopathy    a. 04/29/13: 2D ECHO: LVEF 35-40%  . Low serum HDL   . MI (myocardial infarction) [facility])     Patient Active Problem List   Diagnosis Date Noted  . Prothrombin gene mutation (HCC) 01/02/2015  . Anxiety disorder due to general medical condition with panic attack 01/02/2015  . Unstable angina (HCC) 12/24/2014  . Abnormal nuclear stress test   . Chest pain 12/21/2014  . Low serum HDL   . Hyperglycemia   . Ischemic cardiomyopathy   . CAD S/P LAD PCI - 3 mm x 18 mm long Xience DES (3.3 mm)   . Acute MI, anterolateral wall, subsequent episode of care (HCC) 04/28/2014  . STEMI (ST elevation myocardial infarction) (HCC) 04/28/2014    Past Surgical History:  Procedure Laterality  Date  . APPENDECTOMY    . CARDIAC CATHETERIZATION N/A 12/24/2014   Procedure: Left Heart Cath and Coronary Angiography;  Surgeon: Dolores Patty, MD;  Location: [facility] INVASIVE CV LAB;  Service: Cardiovascular;  Laterality: N/A;  . LEFT HEART CATH N/A 04/28/2014   Procedure: LEFT HEART CATH;  Surgeon: Runell Gess, MD;  Location: [facility] CATH LAB;  Service: Cardiovascular;  Laterality: N/A;       Home Medications    Prior to Admission medications   Medication Sig Start Date End Date Taking? Authorizing Provider  aspirin EC 81 MG EC tablet Take 1 tablet (81 mg total) by mouth daily. Patient taking differently: Take 162 mg by mouth once.  05/01/14  Yes Janetta Hora, PA-C  ticagrelor (BRILINTA) 90 MG TABS tablet Take 1 tablet (90 mg total) by mouth 2 (two) times daily. Please go back to your cardiologist for refills of this medication. 08/05/15  Yes Henrietta Hoover, NP  atorvastatin (LIPITOR) 80 MG tablet Take 1 tablet (80 mg total) by mouth daily at 6 PM. Patient not taking: Reported on 07/10/2015 05/01/14   Janetta Hora, PA-C  carvedilol (COREG) 3.125 MG tablet Take 1 tablet (3.125 mg total) by mouth 2 (two) times daily  with a meal. Patient not taking: Reported on 05/05/2016 08/05/15   Henrietta Hoover, NP  citalopram (CELEXA) 10 MG tablet Take 1 tablet (10 mg total) by mouth daily. Patient not taking: Reported on 07/10/2015 01/02/15 01/02/16  Marrian Salvage, MD  lisinopril (PRINIVIL,ZESTRIL) 5 MG tablet Take 1 tablet (5 mg total) by mouth 2 (two) times daily. Patient not taking: Reported on 07/10/2015 05/01/14   Janetta Hora, PA-C  nitroGLYCERIN (NITROSTAT) 0.4 MG SL tablet Place 1 tablet (0.4 mg total) under the tongue every 5 (five) minutes as needed for chest pain. 05/01/14   Janetta Hora, PA-C  pantoprazole (PROTONIX) 40 MG tablet Take 1 tablet (40 mg total) by mouth 2 (two) times daily. Patient not taking: Reported on 07/10/2015 12/24/14   Valentino Nose, MD    spironolactone (ALDACTONE) 25 MG tablet Take 0.5 tablets (12.5 mg total) by mouth daily. Patient not taking: Reported on 07/10/2015 05/01/14   Janetta Hora, PA-C    Family History Family History  Problem Relation Age of Onset  . Hypertension Father   . Heart attack Neg Hx   . Stroke Neg Hx     Social History Social History  Substance Use Topics  . Smoking status: Former Smoker    Quit date: 2011  . Smokeless tobacco: Never Used  . Alcohol use Yes     Comment: occ     Allergies   Bee pollen   Review of Systems Review of Systems  All other systems reviewed and are negative.    Physical Exam Updated Vital Signs BP 106/62   Pulse 71   Temp 98 F (36.7 C) (Oral)   Resp 17   Ht 5\' 9"  (1.753 m)   Wt 74.8 kg   SpO2 97%   BMI 24.37 kg/m   Physical Exam  Constitutional: He appears well-developed and well-nourished. No distress.  HENT:  Head: Normocephalic and atraumatic.  Right Ear: External ear normal.  Left Ear: External ear normal.  Eyes: Conjunctivae are normal. Right eye exhibits no discharge. Left eye exhibits no discharge. No scleral icterus.  Neck: Neck supple. No tracheal deviation present.  Cardiovascular: Normal rate, regular rhythm and intact distal pulses.   Pulmonary/Chest: Effort normal and breath sounds normal. No stridor. No respiratory distress. He has no wheezes. He has no rales.  Abdominal: Soft. Bowel sounds are normal. He exhibits no distension. There is no tenderness. There is no rebound and no guarding.  Musculoskeletal: He exhibits no edema or tenderness.  Neurological: He is alert. He has normal strength. No cranial nerve deficit (no facial droop, extraocular movements intact, no slurred speech) or sensory deficit. He exhibits normal muscle tone. He displays no seizure activity. Coordination normal.  Skin: Skin is warm and dry. No rash noted.  Psychiatric: He has a normal mood and affect.  Nursing note and vitals reviewed.    ED  Treatments / Results  Labs (all labs ordered are listed, but only abnormal results are displayed) Labs Reviewed  CBC - Abnormal; Notable for the following:       Result Value   WBC 11.2 (*)    Hemoglobin 17.5 (*)    MCHC 36.2 (*)    All other components within normal limits  BASIC METABOLIC PANEL  Rosezena Sensor, ED  Rosezena Sensor, ED    EKG  EKG Interpretation  Date/Time:  Tuesday May 05 2016 06:58:54 EDT Ventricular Rate:  82 PR Interval:  138 QRS Duration: 90 QT Interval:  356  QTC Calculation: 415 R Axis:   51 Text Interpretation:  Sinus rhythm with marked sinus arrhythmia Anterolateral infarct , age undetermined Abnormal ECG No significant change since last tracing Confirmed by Jestina Stephani  MD-J, Viren Lebeau [phone]) on 05/05/2016 7:30:21 AM       Radiology Dg Chest 2 View  Result Date: 05/05/2016 CLINICAL DATA:  Left-sided chest pressure. EXAM: CHEST  2 VIEW COMPARISON:  12/21/2014.  04/28/2014. FINDINGS: Mediastinum and hilar structures normal. Lungs are clear. No focal infiltrate. Tiny calcified left apical nodule most likely granuloma. No pleural effusion or pneumothorax. Degenerative changes thoracic spine. IMPRESSION: No acute cardiopulmonary disease. Electronically Signed   By: Maisie Fus  Register   On: 05/05/2016 07:27    Procedures Procedures (including critical care time)  Medications Ordered in ED Medications  nitroGLYCERIN (NITROGLYN) 2 % ointment 1 inch (1 inch Topical Given 05/05/16 0808)  aspirin chewable tablet 324 mg (324 mg Oral Given 05/05/16 0807)  morphine 4 MG/ML injection 4 mg (4 mg Intravenous Given 05/05/16 0809)  gi cocktail (Maalox,Lidocaine,Donnatal) (30 mLs Oral Given 05/05/16 0851)  ondansetron (ZOFRAN) injection 4 mg (4 mg Intravenous Given 05/05/16 0912)  famotidine (PEPCID) IVPB 20 mg premix (0 mg Intravenous Stopped 05/05/16 0935)     Initial Impression / Assessment and Plan / ED Course  I have reviewed the triage vital signs and the nursing  notes.  Pertinent labs & imaging results that were available during my care of the patient were reviewed by me and considered in my medical decision making (see chart for details).  Clinical Course as of May 05 1099  Tue May 05, 2016  4132 Initial EKG and troponin are normal. Patient unfortunately has a history of coronary artery disease and has not been taking any medications.  I have ordered nitroglycerin and additional aspirin. I will give him a dose of morphine. I will consult with cardiology considering he hasn't been following up with anyone and is not compliant with his medication regimen.  [JK]  0848 Pt is complaining of stomach cramps.  Serial toponin is pending.  Will give gi cocktail and food since he has not eaten anything.   [JK]  0905 Pt states having more burning paining now.  And nausea.  No changes on repeat EKG  [JK]  0907 Spoke with Trish from cardiology.  They will come see the patient  [JK]    Clinical Course User Index [JK] Linwood Dibbles, MD   Pt presents with chest pain.  History of MI at his young age.  Pt has not been taking any of his medications and now has chest pain again.  Not typical of recurrent angina but that is certainly a concern.  Pt has been given ASA and NTG.  No acute EKG changes although still having pain.  First troponin negative.  Plan on cardiology consultation   Pt was seen by the cardiology team including Dr Excell Seltzer.  Sx not felt to be ACS however they did stress the importance of him taking his medications.   Pt is OK for DC  Final Clinical Impressions(s) / ED Diagnoses   Final diagnoses:  Precordial pain    New Prescriptions New Prescriptions   No medications on file     Linwood Dibbles, MD 05/05/16 1101

## 2016-05-15 NOTE — Progress Notes (Signed)
Cardiology Office Note    Date:  05/20/2016   ID:  Chad Fitzgerald, DOB 09/29/1990, MRN [id number]  PCP:  Henrietta Hoover, NP  Cardiologist:  Dr. Allyson Sabal  (Never followed up in clinic)  Chief Complaint: ER visit follow up for chest pain   History of Present Illness:   Chad Fitzgerald is a 26 y.o. male with a history of CAD s/p DES to LAD 04/2014,  Protein gene mutation, ischemic cardiomyopathy, and hypertension  Presents for follow up.   History of an anterolateral ST elevation MI in April 2016. He had a drug-eluting stent placed to his LAD. Last seen 12/2014 when he was admitted with chest pain similar to his prior MI. Stress test was intermediate risk. Follow-up cath showed patent stent. Last echo on 12/21/2014 EF of 35%-40%.  He was in usual state of health up until ER visit 05/05/16 for chest pain after staying up all night studying and drinking energy drinks. EKG without acute changes. Troponin x 2 negative. Symptoms completely resolved after GI cocktail, morphine and nitroglycerin. Unsure which medication help. Recommended ASA 81 mg, carvedilol 3.215 mg BID, and atorvastatin 40 mg daily and discharged from ER.   Here today for follow up. No further chest pain. He is enrolled in summer class. He is compliant with ASA, Coreg and statin. The patient denies nausea, vomiting, fever, chest pain, palpitations, shortness of breath, orthopnea, PND, dizziness, syncope, cough, congestion, abdominal pain, hematochezia, melena, lower extremity edema.   Past Medical History:  Diagnosis Date  . ADD (attention deficit disorder)   . Asthma    as a child  . CAD (coronary artery disease)    a. 04/28/14: acute anterolateral STEMI s/p DES to LAD  . Hyperglycemia    a. 04/2014: Hg A1c 5.3  . Hypertension   . Ischemic cardiomyopathy    a. 04/29/13: 2D ECHO: LVEF 35-40%  . Low serum HDL   . MI (myocardial infarction) [facility])     Past Surgical History:  Procedure Laterality Date  .  APPENDECTOMY    . CARDIAC CATHETERIZATION N/A 12/24/2014   Procedure: Left Heart Cath and Coronary Angiography;  Surgeon: Dolores Patty, MD;  Location: [facility] INVASIVE CV LAB;  Service: Cardiovascular;  Laterality: N/A;  . LEFT HEART CATH N/A 04/28/2014   Procedure: LEFT HEART CATH;  Surgeon: Runell Gess, MD;  Location: [facility] CATH LAB;  Service: Cardiovascular;  Laterality: N/A;    Current Medications:  Prior to Admission medications   Medication Sig Start Date End Date Taking? Authorizing Provider  aspirin 81 MG EC tablet Take 1 tablet (81 mg total) by mouth daily. 05/20/16  Yes Cheresa Siers, PA  atorvastatin (LIPITOR) 40 MG tablet Take 1 tablet (40 mg total) by mouth daily at 6 PM. 05/20/16  Yes Anabelen Kaminsky, PA  carvedilol (COREG) 3.125 MG tablet Take 1 tablet (3.125 mg total) by mouth 2 (two) times daily with a meal. 05/20/16  Yes Darica Goren, PA    Allergies:   Bee pollen   Social History   Social History  . Marital status: Single    Spouse name: N/A  . Number of children: N/A  . Years of education: N/A   Social History Main Topics  . Smoking status: Former Smoker    Quit date: 2011  . Smokeless tobacco: Never Used  . Alcohol use Yes     Comment: occ  . Drug use: No  . Sexual activity: Not Asked   Other Topics Concern  .  None   Social History Narrative  . None     Family History:  The patient's family history includes Hypertension in his father.   ROS:   Please see the history of present illness.    ROS All other systems reviewed and are negative.   PHYSICAL EXAM:   VS:  BP 116/82   Pulse 65   Ht 5\' 9"  (1.753 m)   Wt 173 lb 12.8 oz (78.8 kg)   SpO2 95%   BMI 25.67 kg/m    GEN: Well nourished, well developed, in no acute distress  HEENT: normal  Neck: no JVD, carotid bruits, or masses Cardiac: RRR; no murmurs, rubs, or gallops,no edema  Respiratory:  clear to auscultation bilaterally, normal work of breathing GI: soft, nontender,  nondistended, + BS MS: no deformity or atrophy  Skin: warm and dry, no rash Neuro:  Alert and Oriented x 3, Strength and sensation are intact Psych: euthymic mood, full affect  Wt Readings from Last 3 Encounters:  05/20/16 173 lb 12.8 oz (78.8 kg)  05/05/16 165 lb (74.8 kg)  08/05/15 170 lb (77.1 kg)      Studies/Labs Reviewed:   EKG:  EKG is not ordered today.    Recent Labs: 08/05/2015: ALT 20 05/05/2016: BUN 9; Creatinine, Ser 0.92; Hemoglobin 17.5; Platelets 318; Potassium 3.9; Sodium 138   Lipid Panel    Component Value Date/Time   CHOL 176 08/05/2015 1233   TRIG 148 08/05/2015 1233   HDL 41 08/05/2015 1233   CHOLHDL 4.3 08/05/2015 1233   VLDL 30 08/05/2015 1233   LDLCALC 105 08/05/2015 1233    Additional studies/ records that were reviewed today include:  Cath 12/21/2014 Left Heart Cath and Coronary Angiography  Conclusion   Assessment:  1) LAD stent widely patent 2) Otherwise normal coronaries  3) LVEDP 10mm HG 4) ICM with EF 35-40% by echo   Plan/Discussion:  His LAD stent is widely patent. Otherwise normal coronaries. Continue aggressive RF modification. Home today if post-cath course remains uncomplicated.      Echo: 12/21/14 Study Conclusions  - Left ventricle: LVEF is approximately 35 to 40% with akinesis of the distal septal,, distal inferior and apical walls The cavity size was normal. Wall thickness was normal.      ASSESSMENT & PLAN:    1. CAD s/p DES to LAD 04/2014 - No angina or dyspnea. Continue ASA, statin and BB.   2. ICM - Last echo 12/2014 persisted LV EF of 35-40% (similar EF in 04/2014)  with akinesis of the distal septal,, distal inferior and apical walls The cavity size was normal.  - Euvolemic. No angina and dyspnea. No further testing.  - Continue BB.   3. HLD - 08/05/2015: Cholesterol 176; HDL 41; LDL Cholesterol 105; Triglycerides 148; VLDL 30  - Continue Lipitor 40mg  qd. Recheck LIpid panel and LFTS in 4-6  weeks.   Discussed importance to taking daily medication, regular exercise and heart healthy diet. He is agree with plan and seems more educated about his health condition. He will follow up with me in 1 year. Sooner if needed.   Medication Adjustments/Labs and Tests Ordered: Current medicines are reviewed at length with the patient today.  Concerns regarding medicines are outlined above.  Medication changes, Labs and Tests ordered today are listed in the Patient Instructions below. Patient Instructions  Medication Instructions:  Your physician recommends that you continue on your current medications as directed. Please refer to the Current Medication list given to you  today.   Labwork: 4 WEEKS:  FASTING LIPID & LFT  Testing/Procedures: None ordered  Follow-Up: Your physician wants you to follow-up in: 1 YEAR WITH VIN Soma Bachand, PA-C   You will receive a reminder letter in the mail two months in advance. If you don't receive a letter, please call our office to schedule the follow-up appointment.  Any Other Special Instructions Will Be Listed Below (If Applicable).    If you need a refill on your cardiac medications before your next appointment, please call your pharmacy.      Lorelei Pont, [place]  05/20/2016 8:27 AM    [facility] [address] [place], [place], [place]  [address] Phone: [phone]; Fax: [phone]

## 2016-05-19 ENCOUNTER — Inpatient Hospital Stay (INDEPENDENT_AMBULATORY_CARE_PROVIDER_SITE_OTHER): Payer: Self-pay | Admitting: Physician Assistant

## 2016-05-19 ENCOUNTER — Encounter: Payer: Self-pay | Admitting: Physician Assistant

## 2016-05-20 ENCOUNTER — Encounter: Payer: Self-pay | Admitting: Physician Assistant

## 2016-05-20 ENCOUNTER — Ambulatory Visit (INDEPENDENT_AMBULATORY_CARE_PROVIDER_SITE_OTHER): Payer: Self-pay | Admitting: Physician Assistant

## 2016-05-20 VITALS — BP 116/82 | HR 65 | Ht 69.0 in | Wt 173.8 lb

## 2016-05-20 DIAGNOSIS — I2510 Atherosclerotic heart disease of native coronary artery without angina pectoris: Secondary | ICD-10-CM

## 2016-05-20 DIAGNOSIS — I255 Ischemic cardiomyopathy: Secondary | ICD-10-CM

## 2016-05-20 DIAGNOSIS — E785 Hyperlipidemia, unspecified: Secondary | ICD-10-CM

## 2016-05-20 MED ORDER — ATORVASTATIN CALCIUM 40 MG PO TABS: 40.0000 mg | ORAL_TABLET | Freq: Every day | ORAL | 3 refills | Status: DC

## 2016-05-20 MED ORDER — ASPIRIN 81 MG PO TBEC: 81.0000 mg | DELAYED_RELEASE_TABLET | Freq: Every day | ORAL | 3 refills | Status: DC

## 2016-05-20 MED ORDER — CARVEDILOL 3.125 MG PO TABS: 3.1250 mg | ORAL_TABLET | Freq: Two times a day (BID) | ORAL | 3 refills | Status: DC

## 2016-05-20 NOTE — Patient Instructions (Addendum)
Medication Instructions:  Your physician recommends that you continue on your current medications as directed. Please refer to the Current Medication list given to you today.   Labwork: 4 WEEKS:  FASTING LIPID & LFT  Testing/Procedures: None ordered  Follow-Up: Your physician wants you to follow-up in: 1 YEAR WITH VIN BHAGAT, PA-C   You will receive a reminder letter in the mail two months in advance. If you don't receive a letter, please call our office to schedule the follow-up appointment.  Any Other Special Instructions Will Be Listed Below (If Applicable).    If you need a refill on your cardiac medications before your next appointment, please call your pharmacy.

## 2016-06-18 ENCOUNTER — Other Ambulatory Visit: Payer: Self-pay

## 2016-06-27 ENCOUNTER — Emergency Department (HOSPITAL_COMMUNITY): Payer: Self-pay

## 2016-06-27 ENCOUNTER — Emergency Department (HOSPITAL_COMMUNITY)
Admission: EM | Admit: 2016-06-27 | Discharge: 2016-06-27 | Disposition: A | Discharge status: Home / Self Care | Payer: Self-pay | Attending: Emergency Medicine | Admitting: Emergency Medicine

## 2016-06-27 ENCOUNTER — Encounter (HOSPITAL_COMMUNITY): Payer: Self-pay | Admitting: *Deleted

## 2016-06-27 DIAGNOSIS — K8020 Calculus of gallbladder without cholecystitis without obstruction: Secondary | ICD-10-CM | POA: Insufficient documentation

## 2016-06-27 DIAGNOSIS — R079 Chest pain, unspecified: Secondary | ICD-10-CM

## 2016-06-27 DIAGNOSIS — R0789 Other chest pain: Secondary | ICD-10-CM | POA: Insufficient documentation

## 2016-06-27 DIAGNOSIS — Z79899 Other long term (current) drug therapy: Secondary | ICD-10-CM | POA: Insufficient documentation

## 2016-06-27 DIAGNOSIS — Z7982 Long term (current) use of aspirin: Secondary | ICD-10-CM | POA: Insufficient documentation

## 2016-06-27 DIAGNOSIS — I2510 Atherosclerotic heart disease of native coronary artery without angina pectoris: Secondary | ICD-10-CM | POA: Insufficient documentation

## 2016-06-27 DIAGNOSIS — M546 Pain in thoracic spine: Secondary | ICD-10-CM | POA: Insufficient documentation

## 2016-06-27 DIAGNOSIS — I10 Essential (primary) hypertension: Secondary | ICD-10-CM | POA: Insufficient documentation

## 2016-06-27 DIAGNOSIS — I252 Old myocardial infarction: Secondary | ICD-10-CM | POA: Insufficient documentation

## 2016-06-27 DIAGNOSIS — M549 Dorsalgia, unspecified: Secondary | ICD-10-CM

## 2016-06-27 DIAGNOSIS — Z87891 Personal history of nicotine dependence: Secondary | ICD-10-CM | POA: Insufficient documentation

## 2016-06-27 DIAGNOSIS — J45909 Unspecified asthma, uncomplicated: Secondary | ICD-10-CM | POA: Insufficient documentation

## 2016-06-27 LAB — CBC
HCT: 51.3 % (ref 39.0–52.0)
Hemoglobin: 17.5 g/dL — ABNORMAL HIGH (ref 13.0–17.0)
MCH: 29.7 pg (ref 26.0–34.0)
MCHC: 34.1 g/dL (ref 30.0–36.0)
MCV: 86.9 fL (ref 78.0–100.0)
PLATELETS: 311 10*3/uL (ref 150–400)
RBC: 5.9 MIL/uL — AB (ref 4.22–5.81)
RDW: 12.9 % (ref 11.5–15.5)
WBC: 13 10*3/uL — ABNORMAL HIGH (ref 4.0–10.5)

## 2016-06-27 LAB — BASIC METABOLIC PANEL
ANION GAP: 9 (ref 5–15)
BUN: 7 mg/dL (ref 6–20)
CALCIUM: 9.9 mg/dL (ref 8.9–10.3)
CO2: 28 mmol/L (ref 22–32)
CREATININE: 0.96 mg/dL (ref 0.61–1.24)
Chloride: 102 mmol/L (ref 101–111)
GLUCOSE: 111 mg/dL — AB (ref 65–99)
Potassium: 3.9 mmol/L (ref 3.5–5.1)
Sodium: 139 mmol/L (ref 135–145)

## 2016-06-27 LAB — LIPASE, BLOOD: LIPASE: 32 U/L (ref 11–51)

## 2016-06-27 LAB — I-STAT TROPONIN, ED
TROPONIN I, POC: 0.02 ng/mL (ref 0.00–0.08)
Troponin i, poc: 0 ng/mL (ref 0.00–0.08)

## 2016-06-27 LAB — HEPATIC FUNCTION PANEL
ALT: 31 U/L (ref 17–63)
AST: 22 U/L (ref 15–41)
Albumin: 4.4 g/dL (ref 3.5–5.0)
Alkaline Phosphatase: 60 U/L (ref 38–126)
Bilirubin, Direct: <0.1 mg/dL — ABNORMAL LOW (ref 0.1–0.5)
Total Bilirubin: 0.7 mg/dL (ref 0.3–1.2)
Total Protein: 7.5 g/dL (ref 6.5–8.1)

## 2016-06-27 LAB — I-STAT BETA HCG BLOOD, ED (MC, WL, AP ONLY)

## 2016-06-27 MED ORDER — LORAZEPAM 2 MG/ML IJ SOLN
1.0000 mg | Freq: Once | INTRAMUSCULAR | Status: AC
  Administered 2016-06-27: 1 mg via INTRAVENOUS
  Filled 2016-06-27: qty 1

## 2016-06-27 MED ORDER — OXYCODONE-ACETAMINOPHEN 5-325 MG PO TABS
2.0000 | ORAL_TABLET | Freq: Once | ORAL | Status: AC
  Administered 2016-06-27: 2 via ORAL
  Filled 2016-06-27: qty 2

## 2016-06-27 MED ORDER — IOPAMIDOL (ISOVUE-370) INJECTION 76%
INTRAVENOUS | Status: AC
  Administered 2016-06-27: 100 mL
  Filled 2016-06-27: qty 100

## 2016-06-27 MED ORDER — FENTANYL CITRATE (PF) 100 MCG/2ML IJ SOLN
100.0000 ug | INTRAMUSCULAR | Status: AC | PRN
  Administered 2016-06-27 (×3): 100 ug via INTRAVENOUS
  Filled 2016-06-27 (×3): qty 2

## 2016-06-27 MED ORDER — ONDANSETRON HCL 4 MG/2ML IJ SOLN
4.0000 mg | Freq: Once | INTRAMUSCULAR | Status: AC
  Administered 2016-06-27: 4 mg via INTRAVENOUS
  Filled 2016-06-27: qty 2

## 2016-06-27 MED ORDER — PROMETHAZINE HCL 25 MG PO TABS: 25.0000 mg | ORAL_TABLET | Freq: Four times a day (QID) | ORAL | 0 refills | Status: DC | PRN

## 2016-06-27 MED ORDER — KETOROLAC TROMETHAMINE 30 MG/ML IJ SOLN
30.0000 mg | Freq: Once | INTRAMUSCULAR | Status: AC
  Administered 2016-06-27: 30 mg via INTRAVENOUS
  Filled 2016-06-27: qty 1

## 2016-06-27 MED ORDER — OXYCODONE-ACETAMINOPHEN 5-325 MG PO TABS: 1.0000 | ORAL_TABLET | Freq: Four times a day (QID) | ORAL | 0 refills | Status: DC | PRN

## 2016-06-27 MED ORDER — GI COCKTAIL ~~LOC~~
30.0000 mL | Freq: Once | ORAL | Status: AC
  Administered 2016-06-27: 30 mL via ORAL
  Filled 2016-06-27: qty 30

## 2016-06-27 NOTE — ED Provider Notes (Signed)
[facility]-EMERGENCY DEPT Provider Note   CSN: [id number] Arrival date & time: 06/27/16  0754     History   Chief Complaint Chief Complaint  Patient presents with  . Chest Pain  . Back Pain    HPI Chad Fitzgerald is a 26 y.o. male.  The patient presents for evaluation of chest and upper back pain which began last night, at 1100 p.m.  There was no specific provocation.  He has nausea without vomiting.  He feels short of breath, as if "I cannot get a deep breath."  The pain is severe, worse in the back 9 or 10, and 8/10 anteriorly.  He is concerned that his "reflux" has returned.  He has previously been treated for reflux but been off this medication for at least a year.  His cardiac status has been stable and he follows up with his cardiologist, regularly.  He has a history of anxiety disorder.  He denies fever, chills, cough, change in bowel or urinary habits.  There are no other known modifying factors.  HPI  Past Medical History:  Diagnosis Date  . ADD (attention deficit disorder)   . Asthma    as a child  . CAD (coronary artery disease)    a. 04/28/14: acute anterolateral STEMI s/p DES to LAD  . Hyperglycemia    a. 04/2014: Hg A1c 5.3  . Hypertension   . Ischemic cardiomyopathy    a. 04/29/13: 2D ECHO: LVEF 35-40%  . Low serum HDL   . MI (myocardial infarction) [facility])     Patient Active Problem List   Diagnosis Date Noted  . Prothrombin gene mutation (HCC) 01/02/2015  . Anxiety disorder due to general medical condition with panic attack 01/02/2015  . Unstable angina (HCC) 12/24/2014  . Abnormal nuclear stress test   . Chest pain 12/21/2014  . Low serum HDL   . Hyperglycemia   . Ischemic cardiomyopathy   . CAD S/P LAD PCI - 3 mm x 18 mm long Xience DES (3.3 mm)   . Acute MI, anterolateral wall, subsequent episode of care (HCC) 04/28/2014  . STEMI (ST elevation myocardial infarction) (HCC) 04/28/2014    Past Surgical History:  Procedure Laterality Date  .  APPENDECTOMY    . CARDIAC CATHETERIZATION N/A 12/24/2014   Procedure: Left Heart Cath and Coronary Angiography;  Surgeon: Dolores Patty, MD;  Location: [facility] INVAS[facility] CV LAB;  Service: Cardiovascular;  Laterality: N/A;  . LEFT HEART CATH N/A 04/28/2014   Procedure: LEFT HEART CATH;  Surgeon: Runell Gess, MD;  Location: [facility] CATH LAB;  Service: Cardiovascular;  Laterality: N/A;       Home Medications    Prior to Admission medications   Medication Sig Start Date End Date Taking? Authorizing Provider  aspirin 81 MG EC tablet Take 1 tablet (81 mg total) by mouth daily. 05/20/16  Yes Bhagat, Bhavinkumar, PA  atorvastatin (LIPITOR) 40 MG tablet Take 1 tablet (40 mg total) by mouth daily at 6 PM. 05/20/16  Yes Bhagat, Bhavinkumar, PA  carvedilol (COREG) 3.125 MG tablet Take 1 tablet (3.125 mg total) by mouth 2 (two) times daily with a meal. Patient not taking: Reported on 06/27/2016 05/20/16   Manson Passey, PA    Family History Family History  Problem Relation Age of Onset  . Hypertension Father   . Heart attack Neg Hx   . Stroke Neg Hx     Social History Social History  Substance Use Topics  . Smoking status: Former Smoker  Quit date: 2011  . Smokeless tobacco: Never Used  . Alcohol use Yes     Comment: occ     Allergies   Bee pollen   Review of Systems Review of Systems  All other systems reviewed and are negative.    Physical Exam Updated Vital Signs BP 120/83   Pulse 62   Temp 98.2 F (36.8 C) (Oral)   Resp 14   SpO2 100%   Physical Exam  Constitutional: He is oriented to person, place, and time. He appears well-developed and well-nourished. He appears distressed (He is uncomfortable).  HENT:  Head: Normocephalic and atraumatic.  Right Ear: External ear normal.  Left Ear: External ear normal.  Eyes: Conjunctivae are normal. Right eye exhibits no discharge. Left eye exhibits no discharge.  Neck: Neck supple. No tracheal deviation present.    Cardiovascular: Normal rate, regular rhythm, normal heart sounds and intact distal pulses.   No murmur heard. Pulmonary/Chest: Effort normal and breath sounds normal. No stridor. No respiratory distress. He has no wheezes. He exhibits tenderness (Diffuse lower anterior tenderness, mild).  Abdominal: Soft. There is tenderness (Epigastric, mild).  Musculoskeletal: Normal range of motion. He exhibits no edema or deformity.  Neurological: He is alert and oriented to person, place, and time. No cranial nerve deficit. He exhibits normal muscle tone. Coordination normal.  Skin: Skin is warm and dry.  Psychiatric: His behavior is normal. Judgment and thought content normal.  He is anxious  Nursing note and vitals reviewed.    ED Treatments / Results  Labs (all labs ordered are listed, but only abnormal results are displayed) Labs Reviewed  BASIC METABOLIC PANEL - Abnormal; Notable for the following:       Result Value   Glucose, Bld 111 (*)    All other components within normal limits  CBC - Abnormal; Notable for the following:    WBC 13.0 (*)    RBC 5.90 (*)    Hemoglobin 17.5 (*)    All other components within normal limits  I-STAT TROPOININ, ED    EKG  EKG Interpretation  Date/Time:  Saturday June 27 2016 08:12:17 EDT Ventricular Rate:  68 PR Interval:  120 QRS Duration: 88 QT Interval:  378 QTC Calculation: 401 R Axis:   73 Text Interpretation:  Normal sinus rhythm with sinus arrhythmia Septal infarct , age undetermined Possible Lateral infarct , age undetermined Abnormal ECG since last tracing no significant change Confirmed by Mancel Bale [phone]) on 06/27/2016 8:40:44 AM Also confirmed by Mancel Bale [phone]), editor Madalyn Rob [phone])  on 06/27/2016 8:51:16 AM       Radiology Dg Chest Port 1 View  Result Date: 06/27/2016 CLINICAL DATA:  Back and chest pain since last night. EXAM: PORTABLE CHEST 1 VIEW COMPARISON:  05/05/2016 FINDINGS: The heart size and  mediastinal contours are within normal limits. Both lungs are clear. The visualized skeletal structures are unremarkable. IMPRESSION: No active disease. Electronically Signed   By: Elberta Fortis M.D.   On: 06/27/2016 09:05    Procedures Procedures (including critical care time)  Medications Ordered in ED Medications  gi cocktail (Maalox,Lidocaine,Donnatal) (not administered)     Initial Impression / Assessment and Plan / ED Course  I have reviewed the triage vital signs and the nursing notes.  Pertinent labs & imaging results that were available during my care of the patient were reviewed by me and considered in my medical decision making (see chart for details).      Patient Vitals for  the past 24 hrs:  BP Temp Temp src Pulse Resp SpO2  06/27/16 0901 120/83 - - 62 14 100 %  06/27/16 0836 (!) 140/94 - - 69 14 99 %  06/27/16 0813 (!) 143/106 98.2 F (36.8 C) Oral - 16 99 %    2:45 PM Reevaluation with update and discussion. After initial assessment and treatment, an updated evaluation reveals he states that his pain is much better now, but he is nauseated.  At this time he has mild right upper quadrant tenderness to palpation.  Findings discussed with the patient.  He would like additional imaging, to evaluate the gallbladder.Mancel Bale L    Final Clinical Impressions(s) / ED Diagnoses   Final diagnoses:  Chest pain   Patient's initial presentation was for chest discomfort with symptoms of esophageal reflux.  Evaluation for recurrence of cardiac abnormality, was negative.  He did not respond to antacids.  Incidental gallbladder stones found on imaging for rule out a PE.  Patient sent for gallbladder ultrasound to evaluate for cholecystitis, and other complicating features, since he has an elevated white blood cell count and had severe pain earlier.  Nursing Notes Reviewed/ Care Coordinated Applicable Imaging Reviewed Interpretation of Laboratory Data incorporated into ED  treatment  Plan : care to oncoming provider team to evaluate after imaging of the gallbladder and then decide on disposition.  Patient should be able to be discharged if the ultrasound does not show cholecystitis or other complicating features.   New Prescriptions New Prescriptions   No medications on file     Mancel Bale, MD 06/27/16 1447

## 2016-06-27 NOTE — ED Notes (Signed)
Patient transported to CT 

## 2016-06-27 NOTE — ED Notes (Signed)
Pt to US.

## 2016-06-27 NOTE — ED Notes (Signed)
Pt returned from US

## 2016-06-27 NOTE — ED Notes (Signed)
Pt to CT

## 2016-06-27 NOTE — ED Triage Notes (Signed)
To ED for eval of back pain and cp since approx 10pm last night. Pt states he thought it was GERD but has become worse. States pain is a pressure and a burning. Hx of MI with stents 2 yrs ago. No vomiting. Diaphoresis. Pt states he feels very tired. Appears pale.

## 2016-07-07 ENCOUNTER — Ambulatory Visit (INDEPENDENT_AMBULATORY_CARE_PROVIDER_SITE_OTHER): Payer: Self-pay | Admitting: Physician Assistant

## 2016-07-07 ENCOUNTER — Encounter: Payer: Self-pay | Admitting: Physician Assistant

## 2016-07-07 ENCOUNTER — Ambulatory Visit (INDEPENDENT_AMBULATORY_CARE_PROVIDER_SITE_OTHER): Payer: Self-pay

## 2016-07-07 VITALS — BP 126/85 | HR 78 | Temp 98.3°F | Resp 16 | Ht 67.0 in | Wt 171.2 lb

## 2016-07-07 DIAGNOSIS — K8020 Calculus of gallbladder without cholecystitis without obstruction: Secondary | ICD-10-CM

## 2016-07-07 DIAGNOSIS — R1013 Epigastric pain: Secondary | ICD-10-CM

## 2016-07-07 LAB — POCT CBC
GRANULOCYTE PERCENT: 57.7 % (ref 37–80)
HEMATOCRIT: 51.3 % (ref 43.5–53.7)
Hemoglobin: 17.4 g/dL (ref 14.1–18.1)
Lymph, poc: 3.5 — AB (ref 0.6–3.4)
MCH: 29.6 pg (ref 27–31.2)
MCHC: 33.9 g/dL (ref 31.8–35.4)
MCV: 87.2 fL (ref 80–97)
MID (CBC): 0.3 (ref 0–0.9)
MPV: 6.9 fL (ref 0–99.8)
PLATELET COUNT, POC: 387 10*3/uL (ref 142–424)
POC GRANULOCYTE: 5.3 (ref 2–6.9)
POC LYMPH PERCENT: 38.9 %L (ref 10–50)
POC MID %: 3.4 % (ref 0–12)
RBC: 5.88 M/uL (ref 4.69–6.13)
RDW, POC: 12.9 %
WBC: 9.1 10*3/uL (ref 4.6–10.2)

## 2016-07-07 LAB — POCT URINALYSIS DIP (MANUAL ENTRY)
BILIRUBIN UA: NEGATIVE
BILIRUBIN UA: NEGATIVE mg/dL
GLUCOSE UA: NEGATIVE mg/dL
Leukocytes, UA: NEGATIVE
Nitrite, UA: NEGATIVE
Protein Ur, POC: 30 mg/dL — AB
RBC UA: NEGATIVE
SPEC GRAV UA: 1.015 (ref 1.010–1.025)
Urobilinogen, UA: 0.2 E.U./dL
pH, UA: 8.5 — AB (ref 5.0–8.0)

## 2016-07-07 MED ORDER — TRAMADOL HCL 50 MG PO TABS: 50.0000 mg | ORAL_TABLET | Freq: Three times a day (TID) | ORAL | 0 refills | Status: DC | PRN

## 2016-07-07 MED ORDER — DOCUSATE SODIUM 100 MG PO CAPS: 100.0000 mg | ORAL_CAPSULE | Freq: Two times a day (BID) | ORAL | 0 refills | Status: DC

## 2016-07-07 MED ORDER — HYDROCODONE-ACETAMINOPHEN 5-325 MG PO TABS: 1.0000 | ORAL_TABLET | Freq: Four times a day (QID) | ORAL | 0 refills | Status: DC | PRN

## 2016-07-07 NOTE — Progress Notes (Signed)
07/07/2016 3:42 PM   DOB: 04-21-1990 / MRN: [id number]  SUBJECTIVE:  Chad Fitzgerald is a 26 y.o. male with a history of CAD presenting for abdominal pian that occurs in his back and upper abdomen. He has tried to reduce all of his fat intake but this has not really helped his symptoms.  The pain comes in waves and may last about 1-3 hours. Denies any major stool changes. He denies nausea.  He has a recent RUQ US showing gall stones without obstruction.  He has an appointment with his surgeon on the 12th and plans to keep that appointment. This was diagnosed on 6/23 and he was prescribed oxycodone 5 mg and this has worked well for him.  He will only need this about 1 time daily and only when he has the pain.  He denies chest pain today and just saw CARDS with and was given a good report.    He is allergic to bee pollen.   He  has a past medical history of ADD (attention deficit disorder); Asthma; CAD (coronary artery disease); Hyperglycemia; Hypertension; Ischemic cardiomyopathy; Low serum HDL; and MI (myocardial infarction) ([place]).    He  reports that he quit smoking about 7 years ago. He has never used smokeless tobacco. He reports that he drinks alcohol. He reports that he does not use drugs. He  has no sexual activity history on file. The patient  has a past surgical history that includes Appendectomy; left heart cath (N/A, 04/28/2014); and Cardiac catheterization (N/A, 12/24/2014).  His family history includes Hypertension in his father.  Review of Systems  Constitutional: Negative for fever.  Respiratory: Negative for cough.   Cardiovascular: Negative for chest pain and leg swelling.  Gastrointestinal: Positive for abdominal pain. Negative for blood in stool, constipation, diarrhea, melena, nausea and vomiting.  Genitourinary: Positive for flank pain. Negative for dysuria, frequency, hematuria and urgency.  Skin: Negative for rash.    The problem list and medications were reviewed  and updated by myself where necessary and exist elsewhere in the encounter.   OBJECTIVE:  BP 126/85 (BP Location: Right Arm, Patient Position: Sitting, Cuff Size: Normal)   Pulse 78   Temp 98.3 F (36.8 C) (Oral)   Resp 16   Ht '5\' 7"'  (1.702 m)   Wt 171 lb 3.2 oz (77.7 kg)   SpO2 97%   BMI 26.81 kg/m   Wt Readings from Last 3 Encounters:  07/07/16 171 lb 3.2 oz (77.7 kg)  05/20/16 173 lb 12.8 oz (78.8 kg)  05/05/16 165 lb (74.8 kg)   Lab Results  Component Value Date   ALT 31 06/27/2016   AST 22 06/27/2016   ALKPHOS 60 06/27/2016   BILITOT 0.7 06/27/2016   Lab Results  Component Value Date   LIPASE 32 06/27/2016   Lab Results  Component Value Date   CREATININE 0.96 06/27/2016   BUN 7 06/27/2016   NA 139 06/27/2016   K 3.9 06/27/2016   CL 102 06/27/2016   CO2 28 06/27/2016   Lab Results  Component Value Date   WBC 9.1 07/07/2016   HGB 17.4 07/07/2016   HCT 51.3 07/07/2016   MCV 87.2 07/07/2016   PLT 311 06/27/2016   Lab Results  Component Value Date   CHOL 176 08/05/2015   HDL 41 08/05/2015   LDLCALC 105 08/05/2015   TRIG 148 08/05/2015   CHOLHDL 4.3 08/05/2015    Physical Exam  Constitutional: He appears well-developed. He is active and  cooperative.  Non-toxic appearance.  Cardiovascular: Normal rate, regular rhythm, S1 normal, S2 normal, normal heart sounds, intact distal pulses and normal pulses.  Exam reveals no gallop and no friction rub.   No murmur heard. Pulmonary/Chest: Effort normal. No stridor. No tachypnea. No respiratory distress. He has no wheezes. He has no rales.  Abdominal: Soft. Normal appearance and bowel sounds are normal. He exhibits no distension and no mass. There is no tenderness. There is CVA tenderness (bilateral). There is no rigidity, no rebound, no guarding, no tenderness at McBurney's point and negative Murphy's sign. No hernia.  Musculoskeletal: He exhibits no edema.  Neurological: He is alert.  Skin: Skin is warm and dry.  He is not diaphoretic. No pallor.  Vitals reviewed.   Results for orders placed or performed in visit on 07/07/16 (from the past 72 hour(s))  POCT CBC     Status: Abnormal   Collection Time: 07/07/16  2:52 PM  Result Value Ref Range   WBC 9.1 4.6 - 10.2 K/uL   Lymph, poc 3.5 (A) 0.6 - 3.4   POC LYMPH PERCENT 38.9 10 - 50 %L   MID (cbc) 0.3 0 - 0.9   POC MID % 3.4 0 - 12 %M   POC Granulocyte 5.3 2 - 6.9   Granulocyte percent 57.7 37 - 80 %G   RBC 5.88 4.69 - 6.13 M/uL   Hemoglobin 17.4 14.1 - 18.1 g/dL   HCT, POC 51.3 43.5 - 53.7 %   MCV 87.2 80 - 97 fL   MCH, POC 29.6 27 - 31.2 pg   MCHC 33.9 31.8 - 35.4 g/dL   RDW, POC 12.9 %   Platelet Count, POC 387 142 - 424 K/uL   MPV 6.9 0 - 99.8 fL  POCT urinalysis dipstick     Status: Abnormal   Collection Time: 07/07/16  3:00 PM  Result Value Ref Range   Color, UA yellow yellow   Clarity, UA cloudy (A) clear   Glucose, UA negative negative mg/dL   Bilirubin, UA negative negative   Ketones, POC UA negative negative mg/dL   Spec Grav, UA 1.015 1.010 - 1.025   Blood, UA negative negative   pH, UA 8.5 (A) 5.0 - 8.0   Protein Ur, POC =30 (A) negative mg/dL   Urobilinogen, UA 0.2 0.2 or 1.0 E.U./dL   Nitrite, UA Negative Negative   Leukocytes, UA Negative Negative    Dg Abd 1 View  Result Date: 07/07/2016 CLINICAL DATA:  Flank tenderness.  Proteinuria EXAM: ABDOMEN - 1 VIEW COMPARISON:  CT abdomen and pelvis July 06, 2014 FINDINGS: There is moderate stool throughout the colon. There is no bowel dilatation or air-fluid level to suggest bowel obstruction. No free air. No abnormal calcifications evident. IMPRESSION: No abnormal calcifications. Moderate stool in colon. No bowel obstruction or free air. Electronically Signed   By: Lowella Grip III M.D.   On: 07/07/2016 15:38    ASSESSMENT AND PLAN:  Chad Fitzgerald was seen today for back pain.  Diagnoses and all orders for this visit:  Epigastric pain: Work up is non acute. Will treat  his pain for now.  Advised that he keep his appointment with his general surgeon.   -     CMP14+EGFR -     POCT CBC -     POCT urinalysis dipstick -     Lipase -     H. pylori breath test -     DG Abd 1 View; Future -  traMADol (ULTRAM) 50 MG tablet; Take 1 tablet (50 mg total) by mouth every 8 (eight) hours as needed. -     HYDROcodone-acetaminophen (NORCO) 5-325 MG tablet; Take 1 tablet by mouth every 6 (six) hours as needed for severe pain (May cause constipation). For severe pain only. Do not mix with alcohol, benzodiazepines, muscle relaxer. No refills without office visit.  Gall stones: See most recent US.  He has an appointment scheduled with general surgery. I have instructed him to avoid fats as much as possible.  Use tramadol TID as needed and hydrocodone for acute and intractable pain only.     The patient is advised to call or return to clinic if he does not see an improvement in symptoms, or to seek the care of the closest emergency department if he worsens with the above plan.   Philis Fendt, MHS, PA-C [facility] 07/07/2016 3:42 PM

## 2016-07-07 NOTE — Patient Instructions (Signed)
     IF you received an x-ray today, you will receive an invoice from [facility]. Please contact [facility] at [phone] with questions or concerns regarding your invoice.   IF you received labwork today, you will receive an invoice from [facility]. Please contact [facility] at [phone] with questions or concerns regarding your invoice.   Our billing staff will not be able to assist you with questions regarding bills from these companies.  You will be contacted with the lab results as soon as they are available. The fastest way to get your results is to activate your My Chart account. Instructions are located on the last page of this paperwork. If you have not heard from us regarding the results in 2 weeks, please contact this office.     

## 2016-07-07 NOTE — Progress Notes (Signed)
shay 

## 2016-07-08 LAB — CMP14+EGFR
ALBUMIN: 4.6 g/dL (ref 3.5–5.5)
ALT: 42 IU/L (ref 0–44)
AST: 22 IU/L (ref 0–40)
Albumin/Globulin Ratio: 1.8 (ref 1.2–2.2)
Alkaline Phosphatase: 72 IU/L (ref 39–117)
BUN / CREAT RATIO: 15 (ref 9–20)
BUN: 13 mg/dL (ref 6–20)
Bilirubin Total: 0.4 mg/dL (ref 0.0–1.2)
CO2: 23 mmol/L (ref 20–29)
CREATININE: 0.85 mg/dL (ref 0.76–1.27)
Calcium: 9.8 mg/dL (ref 8.7–10.2)
Chloride: 103 mmol/L (ref 96–106)
GFR calc non Af Amer: 119 mL/min/{1.73_m2} (ref >59)
GFR, EST AFRICAN AMERICAN: 138 mL/min/{1.73_m2} (ref >59)
GLOBULIN, TOTAL: 2.6 g/dL (ref 1.5–4.5)
GLUCOSE: 70 mg/dL (ref 65–99)
Potassium: 4.9 mmol/L (ref 3.5–5.2)
Sodium: 142 mmol/L (ref 134–144)
Total Protein: 7.2 g/dL (ref 6.0–8.5)

## 2016-07-08 LAB — LIPASE: Lipase: 39 U/L (ref 13–78)

## 2016-07-09 LAB — H. PYLORI BREATH TEST: H pylori Breath Test: NEGATIVE

## 2016-07-27 ENCOUNTER — Emergency Department (HOSPITAL_COMMUNITY)
Admission: EM | Admit: 2016-07-27 | Discharge: 2016-07-27 | Disposition: A | Discharge status: Home / Self Care | Payer: Self-pay | Attending: Physician Assistant | Admitting: Physician Assistant

## 2016-07-27 ENCOUNTER — Emergency Department (HOSPITAL_COMMUNITY): Payer: Self-pay

## 2016-07-27 ENCOUNTER — Encounter (HOSPITAL_COMMUNITY): Payer: Self-pay | Admitting: *Deleted

## 2016-07-27 DIAGNOSIS — I2510 Atherosclerotic heart disease of native coronary artery without angina pectoris: Secondary | ICD-10-CM | POA: Insufficient documentation

## 2016-07-27 DIAGNOSIS — J45909 Unspecified asthma, uncomplicated: Secondary | ICD-10-CM | POA: Insufficient documentation

## 2016-07-27 DIAGNOSIS — K5900 Constipation, unspecified: Secondary | ICD-10-CM | POA: Insufficient documentation

## 2016-07-27 DIAGNOSIS — Z79899 Other long term (current) drug therapy: Secondary | ICD-10-CM | POA: Insufficient documentation

## 2016-07-27 DIAGNOSIS — Z7982 Long term (current) use of aspirin: Secondary | ICD-10-CM | POA: Insufficient documentation

## 2016-07-27 DIAGNOSIS — Z91030 Bee allergy status: Secondary | ICD-10-CM | POA: Insufficient documentation

## 2016-07-27 DIAGNOSIS — I252 Old myocardial infarction: Secondary | ICD-10-CM | POA: Insufficient documentation

## 2016-07-27 DIAGNOSIS — Z87891 Personal history of nicotine dependence: Secondary | ICD-10-CM | POA: Insufficient documentation

## 2016-07-27 DIAGNOSIS — R1011 Right upper quadrant pain: Secondary | ICD-10-CM

## 2016-07-27 DIAGNOSIS — I119 Hypertensive heart disease without heart failure: Secondary | ICD-10-CM | POA: Insufficient documentation

## 2016-07-27 DIAGNOSIS — K8020 Calculus of gallbladder without cholecystitis without obstruction: Secondary | ICD-10-CM | POA: Insufficient documentation

## 2016-07-27 LAB — URINALYSIS, ROUTINE W REFLEX MICROSCOPIC
BILIRUBIN URINE: NEGATIVE
GLUCOSE, UA: NEGATIVE mg/dL
Hgb urine dipstick: NEGATIVE
KETONES UR: NEGATIVE mg/dL
Leukocytes, UA: NEGATIVE
Nitrite: NEGATIVE
PH: 6 (ref 5.0–8.0)
PROTEIN: NEGATIVE mg/dL
Specific Gravity, Urine: 1.012 (ref 1.005–1.030)

## 2016-07-27 LAB — CBC
HEMATOCRIT: 47.4 % (ref 39.0–52.0)
Hemoglobin: 16.6 g/dL (ref 13.0–17.0)
MCH: 29.6 pg (ref 26.0–34.0)
MCHC: 35 g/dL (ref 30.0–36.0)
MCV: 84.6 fL (ref 78.0–100.0)
PLATELETS: 250 10*3/uL (ref 150–400)
RBC: 5.6 MIL/uL (ref 4.22–5.81)
RDW: 12.5 % (ref 11.5–15.5)
WBC: 7.2 10*3/uL (ref 4.0–10.5)

## 2016-07-27 LAB — COMPREHENSIVE METABOLIC PANEL
ALBUMIN: 4.1 g/dL (ref 3.5–5.0)
ALT: 39 U/L (ref 17–63)
AST: 31 U/L (ref 15–41)
Alkaline Phosphatase: 63 U/L (ref 38–126)
Anion gap: 10 (ref 5–15)
BUN: 6 mg/dL (ref 6–20)
CHLORIDE: 104 mmol/L (ref 101–111)
CO2: 23 mmol/L (ref 22–32)
CREATININE: 1 mg/dL (ref 0.61–1.24)
Calcium: 9.4 mg/dL (ref 8.9–10.3)
GFR calc Af Amer: >60 mL/min (ref >60)
Glucose, Bld: 141 mg/dL — ABNORMAL HIGH (ref 65–99)
POTASSIUM: 4 mmol/L (ref 3.5–5.1)
Sodium: 137 mmol/L (ref 135–145)
Total Bilirubin: 1 mg/dL (ref 0.3–1.2)
Total Protein: 7.5 g/dL (ref 6.5–8.1)

## 2016-07-27 LAB — LIPASE, BLOOD: LIPASE: 44 U/L (ref 11–51)

## 2016-07-27 MED ORDER — KETOROLAC TROMETHAMINE 15 MG/ML IJ SOLN
15.0000 mg | Freq: Once | INTRAMUSCULAR | Status: AC
  Administered 2016-07-27: 15 mg via INTRAMUSCULAR
  Filled 2016-07-27: qty 1

## 2016-07-27 MED ORDER — ONDANSETRON 4 MG PO TBDP: 4.0000 mg | ORAL_TABLET | Freq: Three times a day (TID) | ORAL | 0 refills | Status: DC | PRN

## 2016-07-27 MED ORDER — IBUPROFEN 800 MG PO TABS: 800.0000 mg | ORAL_TABLET | Freq: Three times a day (TID) | ORAL | 0 refills | Status: DC

## 2016-07-27 MED ORDER — ONDANSETRON 4 MG PO TBDP
4.0000 mg | ORAL_TABLET | Freq: Once | ORAL | Status: AC
  Administered 2016-07-27: 4 mg via ORAL
  Filled 2016-07-27: qty 1

## 2016-07-27 MED ORDER — OXYCODONE-ACETAMINOPHEN 5-325 MG PO TABS: 1.0000 | ORAL_TABLET | Freq: Four times a day (QID) | ORAL | 0 refills | Status: DC | PRN

## 2016-07-27 NOTE — ED Triage Notes (Signed)
Pt states hx of gallstones and now is having bil flank pain, which is similar to when he was dx with gallstones.  Is due for cholecystectomy, but is waiting for ok from cardiologist.

## 2016-07-27 NOTE — Discharge Instructions (Signed)
You already have an appointment with a surgeon. You need to follow up with them to schedule your surgery. We are givingsome pain medication for 2 days but cannot give any further , we would like you to follow up with yourr primary care physician as well.

## 2016-07-27 NOTE — ED Notes (Signed)
Patient able to ambulate independently  

## 2016-07-27 NOTE — ED Provider Notes (Signed)
[facility]-EMERGENCY DEPT Provider Note   CSN: [id number] Arrival date & time: 07/27/16  1023   By signing my name below, I, Chad Fitzgerald, attest that this documentation has been prepared under the direction and in the presence of Chad Fitzgerald, Chad Salt, MD. Electronically signed, Chad Fitzgerald, ED Scribe. 07/27/16. 3:26 PM.   History   Chief Complaint Chief Complaint  Patient presents with  . Flank Pain  . Constipation   The history is provided by the patient and medical records. No language interpreter was used.    Chad Fitzgerald is a 26 y.o. male with h/o gallstones and DES to LAD presenting to the Emergency Department concerning acute on chronic bilateral flank pain onset today. 8/10, constant, shooting pain worse with certain movements and positions. H/o heart attack noted. Pt followed by Dr Excell Seltzer following stent placement. He states he takes aspirin currently and his last appointment with his cardiologist revealed NL testing and lab results. Pt Rx'ed tramadol and oxycodone. He states he is out of pain medications at this time. Pending cholecystectomy; pt awaiting clearance from cardiologist. He states he has called his cardiology practice, but he has not received and further information about this since his last F/U with general surgery on 07/16/2016. No recent trauma/ injury noted. No fever, N/V or any other complaints noted at this time.   Past Medical History:  Diagnosis Date  . ADD (attention deficit disorder)   . Asthma    as a child  . CAD (coronary artery disease)    a. 04/28/14: acute anterolateral STEMI s/p DES to LAD  . Hyperglycemia    a. 04/2014: Hg A1c 5.3  . Hypertension   . Ischemic cardiomyopathy    a. 04/29/13: 2D ECHO: LVEF 35-40%  . Low serum HDL   . MI (myocardial infarction) [facility])     Patient Active Problem List   Diagnosis Date Noted  . Prothrombin gene mutation (HCC) 01/02/2015  . Anxiety disorder due to general medical condition with panic attack  01/02/2015  . Unstable angina (HCC) 12/24/2014  . Abnormal nuclear stress test   . Chest pain 12/21/2014  . Low serum HDL   . Hyperglycemia   . Ischemic cardiomyopathy   . CAD S/P LAD PCI - 3 mm x 18 mm long Xience DES (3.3 mm)   . Acute MI, anterolateral wall, subsequent episode of care (HCC) 04/28/2014  . STEMI (ST elevation myocardial infarction) (HCC) 04/28/2014    Past Surgical History:  Procedure Laterality Date  . APPENDECTOMY    . CARDIAC CATHETERIZATION N/A 12/24/2014   Procedure: Left Heart Cath and Coronary Angiography;  Surgeon: Dolores Patty, MD;  Location: [facility] INVASIVE CV LAB;  Service: Cardiovascular;  Laterality: N/A;  . LEFT HEART CATH N/A 04/28/2014   Procedure: LEFT HEART CATH;  Surgeon: Runell Gess, MD;  Location: [facility] CATH LAB;  Service: Cardiovascular;  Laterality: N/A;       Home Medications    Prior to Admission medications   Medication Sig Start Date End Date Taking? Authorizing Provider  aspirin 81 MG EC tablet Take 1 tablet (81 mg total) by mouth daily. 05/20/16   Bhagat, Sharrell Ku, PA  atorvastatin (LIPITOR) 40 MG tablet Take 1 tablet (40 mg total) by mouth daily at 6 PM. 05/20/16   Bhagat, Bhavinkumar, PA  carvedilol (COREG) 3.125 MG tablet Take 1 tablet (3.125 mg total) by mouth 2 (two) times daily with a meal. 05/20/16   Bhagat, Bhavinkumar, PA  docusate sodium (COLACE) 100 MG capsule Take  1 capsule (100 mg total) by mouth 2 (two) times daily. 07/07/16   Ofilia Neas, PA-C  HYDROcodone-acetaminophen (NORCO) 5-325 MG tablet Take 1 tablet by mouth every 6 (six) hours as needed for severe pain (May cause constipation). For severe pain only. Do not mix with alcohol, benzodiazepines, muscle relaxer. No refills without office visit. 07/07/16   Ofilia Neas, PA-C  oxyCODONE-acetaminophen (PERCOCET) 5-325 MG tablet Take 1-2 tablets by mouth every 6 (six) hours as needed. 06/27/16   Little, Ambrose Finland, MD  promethazine (PHENERGAN) 25 MG tablet Take 1  tablet (25 mg total) by mouth every 6 (six) hours as needed for nausea or vomiting. 06/27/16   Little, Ambrose Finland, MD  traMADol (ULTRAM) 50 MG tablet Take 1 tablet (50 mg total) by mouth every 8 (eight) hours as needed. 07/07/16   Ofilia Neas, PA-C    Family History Family History  Problem Relation Age of Onset  . Hypertension Father   . Heart attack Neg Hx   . Stroke Neg Hx     Social History Social History  Substance Use Topics  . Smoking status: Former Smoker    Quit date: 2011  . Smokeless tobacco: Never Used  . Alcohol use Yes     Comment: occ     Allergies   Bee pollen   Review of Systems Review of Systems  Constitutional: Negative for fever.  Gastrointestinal: Positive for constipation. Negative for abdominal pain, diarrhea, nausea and vomiting.  Genitourinary: Positive for flank pain. Negative for dysuria and hematuria.  Musculoskeletal: Negative for gait problem.  Skin: Negative for color change and wound.  Neurological: Negative for weakness and numbness.  All other systems reviewed and are negative.    Physical Exam Updated Vital Signs BP 117/77   Pulse 60   Temp (!) 97.2 F (36.2 C) (Oral)   Resp 18   Ht 5\' 7"  (1.702 m)   Wt 171 lb (77.6 kg)   SpO2 99%   BMI 26.78 kg/m   Physical Exam  Constitutional: He is oriented to person, place, and time. He appears well-developed and well-nourished.  HENT:  Head: Normocephalic.  Eyes: Pupils are equal, round, and reactive to light. Conjunctivae and EOM are normal.  Neck: Normal range of motion.  Cardiovascular: Normal rate, regular rhythm and normal heart sounds.  Exam reveals no gallop and no friction rub.   No murmur heard. Pulmonary/Chest: Effort normal and breath sounds normal. No respiratory distress. He has no wheezes. He has no rales.  Abdominal: Soft. He exhibits no distension. There is no tenderness. There is no rebound and no guarding.  Genitourinary: Penis normal.  Musculoskeletal:  Normal range of motion. He exhibits no tenderness.  Neurological: He is alert and oriented to person, place, and time.  Skin: Skin is warm. No erythema.  Psychiatric: He has a normal mood and affect.  Nursing note and vitals reviewed.    ED Treatments / Results  DIAGNOSTIC STUDIES: Oxygen Saturation is 99% on RA, NL by my interpretation.    COORDINATION OF CARE: 3:19 PM-Discussed next steps with pt. Pt verbalized understanding and is agreeable with the plan. Will Rx medication.   Labs (all labs ordered are listed, but only abnormal results are displayed) Labs Reviewed  COMPREHENSIVE METABOLIC PANEL - Abnormal; Notable for the following:       Result Value   Glucose, Bld 141 (*)    All other components within normal limits  LIPASE, BLOOD  CBC  URINALYSIS, ROUTINE W  REFLEX MICROSCOPIC    EKG  EKG Interpretation None       Radiology US Abdomen Limited Ruq  Result Date: 07/27/2016 CLINICAL DATA:  Flank pain.  Gallstones. EXAM: ULTRASOUND ABDOMEN LIMITED RIGHT UPPER QUADRANT COMPARISON:  Abdomen 07/07/2016. FINDINGS: Gallbladder: 3.2 cm gallstone noted. Wall thickness 2.2 mm. Negative Murphy sign. Common bile duct: Diameter: 4.4 mm Liver: No focal lesion identified. Within normal limits in parenchymal echogenicity. IMPRESSION: 3.2 cm gallstone. No evidence of cholecystitis. No biliary distention . Electronically Signed   By: Maisie Fus  Register   On: 07/27/2016 13:01    Procedures Procedures (including critical care time)  Medications Ordered in ED Medications - No data to display   Initial Impression / Assessment and Plan / ED Course  I have reviewed the triage vital signs and the nursing notes.  Pertinent labs & imaging results that were available during my care of the patient were reviewed by me and considered in my medical decision making (see chart for details).     I personally performed the services described in this documentation, which was scribed in my  presence. The recorded information has been reviewed and is accurate.   Patient well appearing 26 year old male with history of DES to LAD presenting with right upper quadrant pain. Patient's had this in the past and time was diagnosed with gallstones a months ago. Patient already ahd an appointment with surgeon on the 12th of this month. The surgeon needs to talk with his cardiologist to get approval. He is here because he is hoping to rush that process. We told him that he just needs to call the surgeon to touch base with office in order to move along that surgery. He does not meet any requirements for inpatient admission or emergent removal gallbladder this time. He has no evidence of infection, normal vital signs, pain seems well controlled.  Final Clinical Impressions(s) / ED Diagnoses   Final diagnoses:  RUQ pain    New Prescriptions New Prescriptions   No medications on file     Abelino Derrick, MD 07/27/16 [phone]

## 2016-07-30 ENCOUNTER — Telehealth: Payer: Self-pay | Admitting: Cardiovascular Disease

## 2016-07-30 NOTE — Telephone Encounter (Signed)
Called the patient and left a VM to call back to schedule a new patient appointment.  There is an opening tomorrow on Dr. Hazle Coca schedule at 11:45.

## 2016-07-31 ENCOUNTER — Ambulatory Visit: Payer: Self-pay | Admitting: Cardiovascular Disease

## 2016-08-03 ENCOUNTER — Encounter: Payer: Self-pay | Admitting: *Deleted

## 2016-08-17 ENCOUNTER — Telehealth: Payer: Self-pay | Admitting: Cardiovascular Disease

## 2016-08-17 NOTE — Telephone Encounter (Signed)
Requesting surgical clearance:  1. Type of surgery: Laparoscopic Cholecystectomy  2. Surgeon: Dr. Andrey Campanile  3.Surgical Date:TBD  4. Medications that need to be held: ASA 81   5. CAD: Yes  6. I will defer to:  Chelsea Aus, PA   Contact Information:  [facility], [place] Phone: [phone] Fax: [phone]

## 2016-08-26 NOTE — Telephone Encounter (Signed)
Left message for pt to return my call re: his call for surgical clearance.

## 2016-08-26 NOTE — Telephone Encounter (Signed)
Is patient having any chest pain, sob, trouble breathing when laying down?

## 2016-08-26 NOTE — Telephone Encounter (Signed)
Pt returned my call and he denies cp, sob, or trouble breathing when lying down.  Per Chelsea Aus, PA-C, based upon this, pt is cleared for surgery, he is a low risk from a cardiac standpoint.  It is ok for him to stop Aspirin as Dr. Andrey Campanile suggests, and pt may resume it when he is stable.  Have called [facility] and made April Staton, CMA, aware.

## 2016-08-30 ENCOUNTER — Emergency Department (HOSPITAL_COMMUNITY)
Admission: EM | Admit: 2016-08-30 | Discharge: 2016-08-30 | Disposition: A | Discharge status: Home / Self Care | Payer: Self-pay | Attending: Emergency Medicine | Admitting: Emergency Medicine

## 2016-08-30 ENCOUNTER — Ambulatory Visit (HOSPITAL_COMMUNITY)
Admission: EM | Admit: 2016-08-30 | Discharge: 2016-08-30 | Disposition: A | Discharge status: Nursing Home (Includes ICF) | Payer: Self-pay

## 2016-08-30 DIAGNOSIS — F900 Attention-deficit hyperactivity disorder, predominantly inattentive type: Secondary | ICD-10-CM | POA: Insufficient documentation

## 2016-08-30 DIAGNOSIS — Z87891 Personal history of nicotine dependence: Secondary | ICD-10-CM | POA: Insufficient documentation

## 2016-08-30 DIAGNOSIS — Z760 Encounter for issue of repeat prescription: Secondary | ICD-10-CM | POA: Insufficient documentation

## 2016-08-30 DIAGNOSIS — Z79899 Other long term (current) drug therapy: Secondary | ICD-10-CM | POA: Insufficient documentation

## 2016-08-30 DIAGNOSIS — I252 Old myocardial infarction: Secondary | ICD-10-CM | POA: Insufficient documentation

## 2016-08-30 DIAGNOSIS — I25110 Atherosclerotic heart disease of native coronary artery with unstable angina pectoris: Secondary | ICD-10-CM | POA: Insufficient documentation

## 2016-08-30 DIAGNOSIS — R109 Unspecified abdominal pain: Secondary | ICD-10-CM | POA: Insufficient documentation

## 2016-08-30 DIAGNOSIS — Z7982 Long term (current) use of aspirin: Secondary | ICD-10-CM | POA: Insufficient documentation

## 2016-08-30 MED ORDER — TRAMADOL HCL 50 MG PO TABS: 50.0000 mg | ORAL_TABLET | Freq: Four times a day (QID) | ORAL | 0 refills | Status: DC | PRN

## 2016-08-30 NOTE — ED Provider Notes (Signed)
[facility]PT Provider Note   CSN: [id number] Arrival date & time: 08/30/16  1231     History   Chief Complaint Chief Complaint  Patient presents with  . Medication Refill    HPI Chad Fitzgerald is a 26 y.o. male.  HPI Patient presents to ED for medication refill. He was diagnosed with gallstones one month ago. He states that he has been unable to schedule surgery because he was waiting to get cleared by his cardiologist. He states that he did get recently cleared by his cardiologist. He states that he ran out of his tramadol and only has one Vicodin left. He reports right upper quadrant pain but none presently because he took pain medications prior to arrival. He denies any vomiting, fever, jaundice, chest pain or trouble breathing.  Past Medical History:  Diagnosis Date  . ADD (attention deficit disorder)   . Asthma    as a child  . CAD (coronary artery disease)    a. 04/28/14: acute anterolateral STEMI s/p DES to LAD  . Hyperglycemia    a. 04/2014: Hg A1c 5.3  . Hypertension   . Ischemic cardiomyopathy    a. 04/29/13: 2D ECHO: LVEF 35-40%  . Low serum HDL   . MI (myocardial infarction) [facility])     Patient Active Problem List   Diagnosis Date Noted  . Prothrombin gene mutation (HCC) 01/02/2015  . Anxiety disorder due to general medical condition with panic attack 01/02/2015  . Unstable angina (HCC) 12/24/2014  . Abnormal nuclear stress test   . Chest pain 12/21/2014  . Low serum HDL   . Hyperglycemia   . Ischemic cardiomyopathy   . CAD S/P LAD PCI - 3 mm x 18 mm long Xience DES (3.3 mm)   . Acute MI, anterolateral wall, subsequent episode of care (HCC) 04/28/2014  . STEMI (ST elevation myocardial infarction) (HCC) 04/28/2014    Past Surgical History:  Procedure Laterality Date  . APPENDECTOMY    . CARDIAC CATHETERIZATION N/A 12/24/2014   Procedure: Left Heart Cath and Coronary Angiography;  Surgeon: Dolores Patty, MD;  Location: [facility] INVASIVE CV LAB;   Service: Cardiovascular;  Laterality: N/A;  . LEFT HEART CATH N/A 04/28/2014   Procedure: LEFT HEART CATH;  Surgeon: Runell Gess, MD;  [facility] CATH LAB;  Service: Cardiovascular;  Laterality: N/A;       Home Medications    Prior to Admission medications   Medication Sig Start Date End Date Taking? Authorizing Provider  aspirin 81 MG EC tablet Take 1 tablet (81 mg total) by mouth daily. 05/20/16   Bhagat, Sharrell Ku, PA  atorvastatin (LIPITOR) 40 MG tablet Take 1 tablet (40 mg total) by mouth daily at 6 PM. 05/20/16   Bhagat, Bhavinkumar, PA  carvedilol (COREG) 3.125 MG tablet Take 1 tablet (3.125 mg total) by mouth 2 (two) times daily with a meal. 05/20/16   Bhagat, Bhavinkumar, PA  docusate sodium (COLACE) 100 MG capsule Take 1 capsule (100 mg total) by mouth 2 (two) times daily. 07/07/16   Ofilia Neas, PA-C  HYDROcodone-acetaminophen (NORCO) 5-325 MG tablet Take 1 tablet by mouth every 6 (six) hours as needed for severe pain (May cause constipation). For severe pain only. Do not mix with alcohol, benzodiazepines, muscle relaxer. No refills without office visit. 07/07/16   Ofilia Neas, PA-C  ibuprofen (ADVIL,MOTRIN) 800 MG tablet Take 1 tablet (800 mg total) by mouth 3 (three) times daily. 07/27/16   Mackuen, [name], MD  ondansetron [facility]  ODT) 4 MG disintegrating tablet Take 1 tablet (4 mg total) by mouth every 8 (eight) hours as needed for nausea or vomiting. 07/27/16   Mackuen, Courteney Lyn, MD  oxyCODONE-acetaminophen (PERCOCET) 5-325 MG tablet Take 1-2 tablets by mouth every 6 (six) hours as needed. 06/27/16   Little, Ambrose Finland, MD  oxyCODONE-acetaminophen (PERCOCET/ROXICET) 5-325 MG tablet Take 1 tablet by mouth every 6 (six) hours as needed for severe pain. 07/27/16   Mackuen, Courteney Lyn, MD  promethazine (PHENERGAN) 25 MG tablet Take 1 tablet (25 mg total) by mouth every 6 (six) hours as needed for nausea or vomiting. 06/27/16   Little, Ambrose Finland, MD    traMADol (ULTRAM) 50 MG tablet Take 1 tablet (50 mg total) by mouth every 6 (six) hours as needed. 08/30/16   Dietrich Pates, PA-C    Family History Family History  Problem Relation Age of Onset  . Hypertension Father   . Heart attack Neg Hx   . Stroke Neg Hx     Social History Social History  Substance Use Topics  . Smoking status: Former Smoker    Quit date: 2011  . Smokeless tobacco: Never Used  . Alcohol use Yes     Comment: occ     Allergies   Bee pollen   Review of Systems Review of Systems  Constitutional: Negative for appetite change, chills and fever.  Respiratory: Negative for shortness of breath.   Cardiovascular: Negative for chest pain.  Gastrointestinal: Positive for abdominal pain. Negative for nausea and vomiting.  Skin: Negative for color change.     Physical Exam Updated Vital Signs BP 131/90   Pulse 78   Temp 98.6 F (37 C) (Oral)   Resp 18   SpO2 99%   Physical Exam  Constitutional: He appears well-developed and well-nourished. No distress.  Nontoxic appearing and in no acute distress. Resting comfortably on chair.  HENT:  Head: Normocephalic and atraumatic.  Eyes: Conjunctivae and EOM are normal. No scleral icterus.  Neck: Normal range of motion.  Pulmonary/Chest: Effort normal. No respiratory distress.  Abdominal: Soft. There is no tenderness. There is no rebound and no guarding.  No abdominal tenderness to palpation.  Neurological: He is alert.  Skin: No rash noted. He is not diaphoretic.  Psychiatric: He has a normal mood and affect.  Nursing note and vitals reviewed.    ED Treatments / Results  Labs (all labs ordered are listed, but only abnormal results are displayed) Labs Reviewed - No data to display  EKG  EKG Interpretation None       Radiology No results found.  Procedures Procedures (including critical care time)  Medications Ordered in ED Medications - No data to display   Initial Impression /  Assessment and Plan / ED Course  I have reviewed the triage vital signs and the nursing notes.  Pertinent labs & imaging results that were available during my care of the patient were reviewed by me and considered in my medical decision making (see chart for details).     Patient presents to ED for medication refill. States that he was recently diagnosed with gallstones but has been unable to schedule surgery due to not being cleared by his cardiologist. He was recently cleared by his cardiologist and states that he is going to call his surgeon tomorrow to schedule an appointment. He denies any abdominal pain at this time. He denies any vomiting, fever, jaundice. He is nontoxic appearing and in no acute distress. He states that  he ran out of his trazodone has 1 Vicodin left. Will give patient refill for tramadol and advised him to follow up with surgeon for further evaluation.  [place] narcotic database reviewed.  Final Clinical Impressions(s) / ED Diagnoses   Final diagnoses:  Medication refill    New Prescriptions New Prescriptions   TRAMADOL (ULTRAM) 50 MG TABLET    Take 1 tablet (50 mg total) by mouth every 6 (six) hours as needed.     Dietrich Pates, PA-C 08/30/16 1357    Arby Barrette, MD 08/31/16 [phone]

## 2016-08-30 NOTE — Discharge Instructions (Signed)
Take tramadol as needed for pain. Follow-up with surgeon for further evaluation and management. Return to ED for severe abdominal pain, fevers, vomiting, jaundice, chest pain or trouble breathing.

## 2016-08-30 NOTE — ED Triage Notes (Addendum)
Pt was seen here in July, diagnosed with gallstones. Pt is out of his tramadol, only has 1 vicodin left. Pt is being followed by MD Andrey Campanile at [facility] for a removal of gallbladder. Pt states he was waiting on cardiology to clear him for surgery (per pt chart, on 8/22 he was cleared by cardiology and MD Wilson's office was notified that he is cleared by cardiology for surgery to be scheduled). Pt states the pain is bad and he needs a prescription for pain mediation. States "I know what is going on with me I do not need to be seen for this, I just need a pain prescription until I can call tomorrow to schedule my surgery appointment." Pt has no pain currently because he took his pain medication prior to arriving.

## 2016-09-01 ENCOUNTER — Other Ambulatory Visit: Payer: Self-pay | Admitting: Family Medicine

## 2016-10-08 ENCOUNTER — Encounter (HOSPITAL_COMMUNITY): Payer: Self-pay | Admitting: *Deleted

## 2016-10-08 ENCOUNTER — Ambulatory Visit: Payer: Self-pay | Admitting: General Surgery

## 2016-10-08 NOTE — Progress Notes (Signed)
Anesthesia Chart Review:  Pt is a same day work up.  Pt is a 26 year old male scheduled for laparoscopic cholecystectomy with intraoperative cholangiogram on 10/09/2016 with Gaynelle Adu, MD  - Sees Manson Passey, [place] with cardiology, last office visit 05/20/16.  Mr. Chad Fitzgerald has cleared pt for surgery.   PMH includes:  CAD (STEMI -> DES to LAD 04/28/14), ischemic cardiomyopathy, HTN, ADD. Former smoker.   Medications include: ASA 81 mg  Labs will be obtained DOS  CT angio chest 06/27/16:  1. No evidence acute pulmonary embolism. 2. No acute pulmonary parenchymal abnormality. 3. Cholelithiasis.  1 view CXR 06/27/16: No active disease.  EKG 06/27/16: NSR with sinus arrhythmia. Septal infarct, age undetermined. Possible Lateral infarct , age undetermined  Cardiac cath 12/24/14:  1) LAD stent widely patent 2) Otherwise normal coronaries  3) LVEDP 69mm HG 4) ICM with EF 35-40% by echo   Echo 12/21/14:  - Left ventricle: LVEF is approximately 35 to 40% with akinesis of the distal septal,, distal inferior and apical walls The cavity size was normal. Wall thickness was normal.  If labs acceptable DOS, and no acute CV symptoms, I anticipate pt can proceed as scheduled.   Rica Mast, FNP-BC [facility] Short Stay Surgical Center/Anesthesiology Phone: [phone] 10/08/2016 3:51 PM

## 2016-10-08 NOTE — H&P (Signed)
Chad Fitzgerald 07/16/2016 2:57 PM Location: [facility] Patient #: [id number] DOB: 08-28-1990 Single / Language: [place] / Race: Refused to Report/Unreported Male   History of Present Illness Chad Areola M. Immanuel Fedak MD; 07/16/2016 3:36 PM) The patient is a 26 year old male who presents for evaluation of gall stones. He is referred by Dr. Clarene Fitzgerald for evaluation of gallstone disease. He reports that he has had several episodes where he has had left-sided mainly upper quadrant abdominal pain that will last for a few hours and then will go away. It is not necessarily related to any food intake. He has not found anything that makes it worse per se. He ended up going to the emergency room on June 23 complaining of chest and upper abdominal pain along with upper back pain and nausea. An extensive cardiac workup was done along with a cardiology consult given his cardiac history. He had a myocardial infarction in 2016. He had been taking some Adderall in the setting of untreated hypertension. He underwent placement of a stent in his LAD. It appears that he had intermittent follow-up with cardiology afterwards. He did take an antiplatelet agent for around a year. In the ER a CT scan of his chest was unremarkable except for evidence of a gallstone. He underwent an abdominal ultrasound which confirmed a 1 cm gallstone in the neck. Labs were unremarkable except for a white blood cell count 13,000. His lipase was normal. He ended up going to see primary care around July 3 with similar complaints of upper abdominal discomfort that was lasting for a few hours. He has had GERD in the past but he states this discomfort is completely different. He denies any NSAID use. He does not smoke. He reports no diarrhea or constipation except when he was taking the pain medication given to him for the abdominal pain.   Problem List/Past Medical Chad Areola M. Andrey Campanile, MD; 07/16/2016 3:36 PM) SYMPTOMATIC CHOLELITHIASIS  (K80.20)   Past Surgical History (Janette Ranson, CMA; 07/16/2016 2:58 PM) Appendectomy   Allergies (Janette Ranson, CMA; 07/16/2016 2:59 PM) No Known Drug Allergies 07/16/2016  Medication History (Janette Ranson, CMA; 07/16/2016 3:01 PM) Aspirin (  Tablet DR, Oral) Active. Carvedilol (3.125MG  Tablet, Oral) Active. Hydrocodone-Acetaminophen (5-325MG  Tablet, Oral) Active. Oxycodone-Acetaminophen (5-325MG  Tablet, Oral) Active. Phenergan (12.5MG  Tablet, Oral) Active. TraMADol HCl (  Tablet, Oral) Active. Medications Reconciled  Social History (Janette Ranson, CMA; 07/16/2016 2:58 PM) Caffeine use  Tea.  Family History (Janette Ranson, CMA; 07/16/2016 2:58 PM) Hypertension  Father.  Other Problems Chad Areola M. Andrey Campanile, MD; 07/16/2016 3:36 PM) Anxiety Disorder  Gastroesophageal Reflux Disease  High blood pressure  Hypercholesterolemia  Other disease, cancer, significant illness  Myocardial infarction     Review of Systems (Janette Ranson CMA; 07/16/2016 2:58 PM) General Not Present- Appetite Loss, Chills, Fatigue, Fever, Night Sweats, Weight Gain and Weight Loss. Skin Not Present- Change in Wart/Mole, Dryness, Hives, Jaundice, New Lesions, Non-Healing Wounds, Rash and Ulcer. HEENT Not Present- Earache, Hearing Loss, Hoarseness, Nose Bleed, Oral Ulcers, Ringing in the Ears, Seasonal Allergies, Sinus Pain, Sore Throat, Visual Disturbances, Wears glasses/contact lenses and Yellow Eyes. Breast Present- Breast Pain. Not Present- Breast Mass, Nipple Discharge and Skin Changes. Cardiovascular Not Present- Chest Pain, Difficulty Breathing Lying Down, Leg Cramps, Palpitations, Rapid Heart Rate, Shortness of Breath and Swelling of Extremities. Gastrointestinal Present- Abdominal Pain and Constipation. Not Present- Bloating, Bloody Stool, Change in Bowel Habits, Chronic diarrhea, Difficulty Swallowing, Excessive gas, Gets full quickly at meals, Hemorrhoids, Indigestion, Nausea,  Rectal Pain and  Vomiting. Musculoskeletal Present- Back Pain. Not Present- Joint Pain, Joint Stiffness, Muscle Pain, Muscle Weakness and Swelling of Extremities. Endocrine Not Present- Cold Intolerance, Excessive Hunger, Hair Changes, Heat Intolerance, Hot flashes and New Diabetes.  Vitals (Janette Ranson CMA; 07/16/2016 3:01 PM) 07/16/2016 3:01 PM Weight: 170.8 lb Height: 67in Body Surface Area: 1.89 m Body Mass Index: 26.75 kg/m  Temp.: 97.9F  Pulse: 94 (Regular)  BP: 100/70 (Sitting, Left Arm, Standard)       Physical Exam Chad Areola M. Holle Sprick MD; 07/16/2016 3:34 PM) General Mental Status-Alert. General Appearance-Cooperative. Orientation-Oriented X4. Posture-Normal posture.  Integumentary Global Assessment Upon inspection and palpation of skin surfaces of the - Head/Face: no rashes, ulcers, lesions or evidence of photo damage. No palpable nodules or masses and Neck: no visible lesions or palpable masses.  Head and Neck Head-normocephalic, atraumatic with no lesions or palpable masses. Face Global Assessment - atraumatic. Thyroid Gland Characteristics - normal size and consistency.  Eye Eyeball - Bilateral-Extraocular movements intact. Sclera/Conjunctiva - Bilateral-No scleral icterus, No Discharge.  ENMT Nose and Sinuses External Inspection of the Nose - no deformities observed, no swelling present.  Chest and Lung Exam Palpation Palpation of the chest reveals - Non-tender. Auscultation Breath sounds - Normal.  Cardiovascular Auscultation Rhythm - Regular. Heart Sounds - S1 WNL and S2 WNL. Carotid arteries - No Carotid bruit.  Abdomen Inspection Inspection of the abdomen reveals - No Visible peristalsis, No Abnormal pulsations and No Paradoxical movements. Palpation/Percussion Palpation and Percussion of the abdomen reveal - Soft, No Rebound tenderness, No Rigidity (guarding), No hepatosplenomegaly and No Palpable abdominal masses.  Note: He does have some tenderness to deep palpation in the right upper quadrant.  Peripheral Vascular Upper Extremity Palpation - Pulses bilaterally normal. Lower Extremity Palpation - Edema - Bilateral - No edema.  Neurologic Neurologic evaluation reveals -normal sensation and normal coordination.  Neuropsychiatric Mental status exam performed with findings of-able to articulate well with normal speech/language, rate, volume and coherence and thought content normal with ability to perform basic computations and apply abstract reasoning.  Musculoskeletal Normal Exam - Bilateral-Upper Extremity Strength Normal and Lower Extremity Strength Normal.    Assessment & Plan Chad Areola M. Daliah Chaudoin MD; 07/16/2016 3:34 PM) SYMPTOMATIC CHOLELITHIASIS (K80.20) Impression: Although the patient states that his pain is on his left upper quadrant he is definitely more tender on his right side therefore I believe the patient's symptoms are consistent with gallbladder disease. His H. pylori test was negative. There is no evidence of hiatal hernia on CT imaging. He does not take NSAIDs. I do not believe his symptoms are consistent with peptic ulcer disease  We discussed gallbladder disease. The patient was given Agricultural engineer. We discussed non-operative and operative management. We discussed the signs & symptoms of acute cholecystitis  I discussed laparoscopic cholecystectomy with poosible IOC in detail. The patient was given educational material as well as diagrams detailing the procedure. We discussed the risks and benefits of a laparoscopic cholecystectomy including, but not limited to bleeding, infection, injury to surrounding structures such as the intestine or liver, bile leak, retained gallstones, need to convert to an open procedure, prolonged diarrhea, blood clots such as DVT, common bile duct injury, anesthesia risks, and possible need for additional procedures. We discussed the typical  post-operative recovery course. I explained that the likelihood of improvement of their symptoms is good.  The patient has elected to proceed with surgery. Current Plans Pt Education - Pamphlet Given - Laparoscopic Gallbladder Surgery: discussed with patient and provided information. HISTORY  OF MYOCARDIAL INFARCTION (I25.2) Story: s/p LAD PCI 2016  Babbette Dalesandro M. Andrey Campanile, MD, FACS General, Bariatric, & Minimally Invasive Surgery [facility], [place]

## 2016-10-08 NOTE — Progress Notes (Signed)
Pt denies SOB and chest pain but is under the care of Manson Passey, PA, Cardiology. Pt denies recent labs. Pt stated that last dose of Aspirin was today; lvm with Steward Drone, Surgical Coordinator to make MD aware. Pt made aware to stop  Aspirin, vitamins, fish oil and herbal medications. Do not take any NSAIDs ie: Ibuprofen, Advil, Naproxen (Aleve), Motrin, BC and Goody Powder. Pt verbalized understanding of all pre-op instructions. Anesthesia asked to review pt clearance note.

## 2016-10-09 ENCOUNTER — Ambulatory Visit (HOSPITAL_COMMUNITY): Payer: Self-pay | Admitting: Emergency Medicine

## 2016-10-09 ENCOUNTER — Encounter (HOSPITAL_COMMUNITY): Payer: Self-pay | Admitting: *Deleted

## 2016-10-09 ENCOUNTER — Ambulatory Visit (HOSPITAL_COMMUNITY)
Admission: RE | Admit: 2016-10-09 | Discharge: 2016-10-09 | Disposition: A | Discharge status: Home / Self Care | Payer: Self-pay | Source: Ambulatory Visit | Attending: General Surgery | Admitting: General Surgery

## 2016-10-09 ENCOUNTER — Encounter (HOSPITAL_COMMUNITY)
Admission: RE | Disposition: A | Discharge status: Home / Self Care | Payer: Self-pay | Source: Ambulatory Visit | Attending: General Surgery

## 2016-10-09 ENCOUNTER — Ambulatory Visit (HOSPITAL_COMMUNITY): Payer: Self-pay

## 2016-10-09 DIAGNOSIS — K8010 Calculus of gallbladder with chronic cholecystitis without obstruction: Secondary | ICD-10-CM | POA: Insufficient documentation

## 2016-10-09 DIAGNOSIS — J45909 Unspecified asthma, uncomplicated: Secondary | ICD-10-CM | POA: Insufficient documentation

## 2016-10-09 DIAGNOSIS — I255 Ischemic cardiomyopathy: Secondary | ICD-10-CM | POA: Insufficient documentation

## 2016-10-09 DIAGNOSIS — F419 Anxiety disorder, unspecified: Secondary | ICD-10-CM | POA: Insufficient documentation

## 2016-10-09 DIAGNOSIS — M109 Gout, unspecified: Secondary | ICD-10-CM | POA: Insufficient documentation

## 2016-10-09 DIAGNOSIS — Z87891 Personal history of nicotine dependence: Secondary | ICD-10-CM | POA: Insufficient documentation

## 2016-10-09 DIAGNOSIS — I10 Essential (primary) hypertension: Secondary | ICD-10-CM | POA: Insufficient documentation

## 2016-10-09 DIAGNOSIS — F909 Attention-deficit hyperactivity disorder, unspecified type: Secondary | ICD-10-CM | POA: Insufficient documentation

## 2016-10-09 DIAGNOSIS — I219 Acute myocardial infarction, unspecified: Secondary | ICD-10-CM | POA: Insufficient documentation

## 2016-10-09 DIAGNOSIS — I252 Old myocardial infarction: Secondary | ICD-10-CM | POA: Insufficient documentation

## 2016-10-09 DIAGNOSIS — K219 Gastro-esophageal reflux disease without esophagitis: Secondary | ICD-10-CM | POA: Insufficient documentation

## 2016-10-09 DIAGNOSIS — Z419 Encounter for procedure for purposes other than remedying health state, unspecified: Secondary | ICD-10-CM

## 2016-10-09 DIAGNOSIS — R739 Hyperglycemia, unspecified: Secondary | ICD-10-CM | POA: Insufficient documentation

## 2016-10-09 DIAGNOSIS — I2510 Atherosclerotic heart disease of native coronary artery without angina pectoris: Secondary | ICD-10-CM | POA: Insufficient documentation

## 2016-10-09 DIAGNOSIS — Z7982 Long term (current) use of aspirin: Secondary | ICD-10-CM | POA: Insufficient documentation

## 2016-10-09 DIAGNOSIS — Z7902 Long term (current) use of antithrombotics/antiplatelets: Secondary | ICD-10-CM | POA: Insufficient documentation

## 2016-10-09 HISTORY — DX: Gout, unspecified: M10.9

## 2016-10-09 HISTORY — DX: Anxiety disorder, unspecified: F41.9

## 2016-10-09 HISTORY — PX: CHOLECYSTECTOMY: SHX55

## 2016-10-09 HISTORY — DX: Calculus of gallbladder without cholecystitis without obstruction: K80.20

## 2016-10-09 HISTORY — DX: Presence of spectacles and contact lenses: Z97.3

## 2016-10-09 HISTORY — DX: Gastro-esophageal reflux disease without esophagitis: K21.9

## 2016-10-09 LAB — CBC
HCT: 50.6 % (ref 39.0–52.0)
Hemoglobin: 17.7 g/dL — ABNORMAL HIGH (ref 13.0–17.0)
MCH: 29.9 pg (ref 26.0–34.0)
MCHC: 35 g/dL (ref 30.0–36.0)
MCV: 85.5 fL (ref 78.0–100.0)
PLATELETS: 278 10*3/uL (ref 150–400)
RBC: 5.92 MIL/uL — AB (ref 4.22–5.81)
RDW: 13.1 % (ref 11.5–15.5)
WBC: 9.7 10*3/uL (ref 4.0–10.5)

## 2016-10-09 LAB — BASIC METABOLIC PANEL
Anion gap: 8 (ref 5–15)
BUN: 11 mg/dL (ref 6–20)
CALCIUM: 9.1 mg/dL (ref 8.9–10.3)
CO2: 19 mmol/L — ABNORMAL LOW (ref 22–32)
CREATININE: 0.87 mg/dL (ref 0.61–1.24)
Chloride: 110 mmol/L (ref 101–111)
GFR calc Af Amer: >60 mL/min (ref >60)
GLUCOSE: 86 mg/dL (ref 65–99)
Potassium: 4 mmol/L (ref 3.5–5.1)
Sodium: 137 mmol/L (ref 135–145)

## 2016-10-09 SURGERY — LAPAROSCOPIC CHOLECYSTECTOMY WITH INTRAOPERATIVE CHOLANGIOGRAM
Anesthesia: General

## 2016-10-09 MED ORDER — PROMETHAZINE HCL 25 MG/ML IJ SOLN
6.2500 mg | INTRAMUSCULAR | Status: AC
  Administered 2016-10-09: 6.25 mg via INTRAVENOUS

## 2016-10-09 MED ORDER — MIDAZOLAM HCL 5 MG/5ML IJ SOLN
INTRAMUSCULAR | Status: DC | PRN
  Administered 2016-10-09: 2 mg via INTRAVENOUS

## 2016-10-09 MED ORDER — LIDOCAINE 2% (20 MG/ML) 5 ML SYRINGE
INTRAMUSCULAR | Status: DC | PRN
  Administered 2016-10-09: 20 mg via INTRAVENOUS

## 2016-10-09 MED ORDER — CHLORHEXIDINE GLUCONATE CLOTH 2 % EX PADS: 6.0000 | MEDICATED_PAD | Freq: Once | CUTANEOUS | Status: DC

## 2016-10-09 MED ORDER — ROCURONIUM BROMIDE 10 MG/ML (PF) SYRINGE
PREFILLED_SYRINGE | INTRAVENOUS | Status: AC
  Filled 2016-10-09: qty 5

## 2016-10-09 MED ORDER — FENTANYL CITRATE (PF) 250 MCG/5ML IJ SOLN
INTRAMUSCULAR | Status: AC
Start: 2016-10-09 — End: 2016-10-09
  Filled 2016-10-09: qty 5

## 2016-10-09 MED ORDER — GABAPENTIN 300 MG PO CAPS
300.0000 mg | ORAL_CAPSULE | ORAL | Status: AC
  Administered 2016-10-09: 300 mg via ORAL
  Filled 2016-10-09: qty 1

## 2016-10-09 MED ORDER — ACETAMINOPHEN 500 MG PO TABS: 1000.0000 mg | ORAL_TABLET | Freq: Four times a day (QID) | ORAL | Status: DC

## 2016-10-09 MED ORDER — ONDANSETRON HCL 4 MG/2ML IJ SOLN
INTRAMUSCULAR | Status: DC | PRN
  Administered 2016-10-09: 4 mg via INTRAVENOUS

## 2016-10-09 MED ORDER — CEFOTETAN DISODIUM-DEXTROSE 2-2.08 GM-% IV SOLR
INTRAVENOUS | Status: AC
  Filled 2016-10-09: qty 50

## 2016-10-09 MED ORDER — LACTATED RINGERS IV SOLN: INTRAVENOUS | Status: DC

## 2016-10-09 MED ORDER — FENTANYL CITRATE (PF) 100 MCG/2ML IJ SOLN
INTRAMUSCULAR | Status: DC | PRN
  Administered 2016-10-09: 150 ug via INTRAVENOUS
  Administered 2016-10-09 (×3): 50 ug via INTRAVENOUS

## 2016-10-09 MED ORDER — FENTANYL CITRATE (PF) 100 MCG/2ML IJ SOLN
25.0000 ug | Freq: Once | INTRAMUSCULAR | Status: AC
  Administered 2016-10-09: 25 ug via INTRAVENOUS

## 2016-10-09 MED ORDER — IOPAMIDOL (ISOVUE-300) INJECTION 61%
INTRAVENOUS | Status: AC
  Filled 2016-10-09: qty 50

## 2016-10-09 MED ORDER — FENTANYL CITRATE (PF) 100 MCG/2ML IJ SOLN
25.0000 ug | INTRAMUSCULAR | Status: DC | PRN
  Administered 2016-10-09 (×2): 50 ug via INTRAVENOUS

## 2016-10-09 MED ORDER — BUPIVACAINE-EPINEPHRINE (PF) 0.25% -1:200000 IJ SOLN
INTRAMUSCULAR | Status: AC
  Filled 2016-10-09: qty 30

## 2016-10-09 MED ORDER — FENTANYL CITRATE (PF) 100 MCG/2ML IJ SOLN
INTRAMUSCULAR | Status: AC
  Administered 2016-10-09: 50 ug via INTRAVENOUS
  Filled 2016-10-09: qty 2

## 2016-10-09 MED ORDER — SODIUM CHLORIDE 0.9 % IV SOLN
INTRAVENOUS | Status: DC | PRN
  Administered 2016-10-09: 12 mL

## 2016-10-09 MED ORDER — PROPOFOL 10 MG/ML IV BOLUS
INTRAVENOUS | Status: AC
  Filled 2016-10-09: qty 20

## 2016-10-09 MED ORDER — FENTANYL CITRATE (PF) 100 MCG/2ML IJ SOLN
INTRAMUSCULAR | Status: AC
  Filled 2016-10-09: qty 2

## 2016-10-09 MED ORDER — SUGAMMADEX SODIUM 200 MG/2ML IV SOLN
INTRAVENOUS | Status: AC
  Filled 2016-10-09: qty 2

## 2016-10-09 MED ORDER — ACETAMINOPHEN 500 MG PO TABS
1000.0000 mg | ORAL_TABLET | ORAL | Status: AC
  Administered 2016-10-09: 1000 mg via ORAL
  Filled 2016-10-09: qty 2

## 2016-10-09 MED ORDER — SUGAMMADEX SODIUM 200 MG/2ML IV SOLN
INTRAVENOUS | Status: DC | PRN
  Administered 2016-10-09: 200 mg via INTRAVENOUS

## 2016-10-09 MED ORDER — OXYCODONE-ACETAMINOPHEN 5-325 MG PO TABS: 1.0000 | ORAL_TABLET | Freq: Four times a day (QID) | ORAL | 0 refills | Status: DC | PRN

## 2016-10-09 MED ORDER — PROMETHAZINE HCL 25 MG/ML IJ SOLN
INTRAMUSCULAR | Status: AC
  Administered 2016-10-09: 6.25 mg via INTRAVENOUS
  Filled 2016-10-09: qty 1

## 2016-10-09 MED ORDER — SODIUM CHLORIDE 0.9% FLUSH: 3.0000 mL | Freq: Two times a day (BID) | INTRAVENOUS | Status: DC

## 2016-10-09 MED ORDER — CEFOTETAN DISODIUM-DEXTROSE 2-2.08 GM-% IV SOLR
2.0000 g | INTRAVENOUS | Status: AC
  Administered 2016-10-09: 2 g via INTRAVENOUS
  Filled 2016-10-09: qty 50

## 2016-10-09 MED ORDER — PROPOFOL 10 MG/ML IV BOLUS
INTRAVENOUS | Status: DC | PRN
  Administered 2016-10-09: 150 mg via INTRAVENOUS

## 2016-10-09 MED ORDER — ONDANSETRON HCL 4 MG/2ML IJ SOLN
INTRAMUSCULAR | Status: AC
  Filled 2016-10-09: qty 2

## 2016-10-09 MED ORDER — FENTANYL CITRATE (PF) 250 MCG/5ML IJ SOLN
INTRAMUSCULAR | Status: AC
  Filled 2016-10-09: qty 5

## 2016-10-09 MED ORDER — OXYCODONE HCL 5 MG PO TABS
ORAL_TABLET | ORAL | Status: AC
  Filled 2016-10-09: qty 2

## 2016-10-09 MED ORDER — SODIUM CHLORIDE 0.9 % IR SOLN
Status: DC | PRN
  Administered 2016-10-09: 1

## 2016-10-09 MED ORDER — OXYCODONE HCL 5 MG PO TABS
5.0000 mg | ORAL_TABLET | ORAL | Status: DC | PRN
  Administered 2016-10-09: 10 mg via ORAL

## 2016-10-09 MED ORDER — DEXAMETHASONE SODIUM PHOSPHATE 10 MG/ML IJ SOLN
INTRAMUSCULAR | Status: AC
  Filled 2016-10-09: qty 1

## 2016-10-09 MED ORDER — LACTATED RINGERS IV SOLN
INTRAVENOUS | Status: DC | PRN
  Administered 2016-10-09 (×2): via INTRAVENOUS

## 2016-10-09 MED ORDER — KETOROLAC TROMETHAMINE 30 MG/ML IJ SOLN
INTRAMUSCULAR | Status: AC
  Filled 2016-10-09: qty 1

## 2016-10-09 MED ORDER — DEXAMETHASONE SODIUM PHOSPHATE 10 MG/ML IJ SOLN
INTRAMUSCULAR | Status: DC | PRN
  Administered 2016-10-09: 10 mg via INTRAVENOUS

## 2016-10-09 MED ORDER — LIDOCAINE 2% (20 MG/ML) 5 ML SYRINGE
INTRAMUSCULAR | Status: AC
  Filled 2016-10-09: qty 5

## 2016-10-09 MED ORDER — MORPHINE SULFATE (PF) 2 MG/ML IV SOLN: 1.0000 mg | INTRAVENOUS | Status: DC | PRN

## 2016-10-09 MED ORDER — SODIUM CHLORIDE 0.9% FLUSH: 3.0000 mL | INTRAVENOUS | Status: DC | PRN

## 2016-10-09 MED ORDER — KETOROLAC TROMETHAMINE 30 MG/ML IJ SOLN
INTRAMUSCULAR | Status: DC | PRN
  Administered 2016-10-09: 30 mg via INTRAVENOUS

## 2016-10-09 MED ORDER — ROCURONIUM BROMIDE 10 MG/ML (PF) SYRINGE
PREFILLED_SYRINGE | INTRAVENOUS | Status: DC | PRN
  Administered 2016-10-09: 60 mg via INTRAVENOUS

## 2016-10-09 MED ORDER — BUPIVACAINE-EPINEPHRINE 0.25% -1:200000 IJ SOLN
INTRAMUSCULAR | Status: DC | PRN
Start: 2016-10-09 — End: 2016-10-09
  Administered 2016-10-09: 9 mL

## 2016-10-09 MED ORDER — SODIUM CHLORIDE 0.9 % IV SOLN: 250.0000 mL | INTRAVENOUS | Status: DC | PRN

## 2016-10-09 MED ORDER — SODIUM CHLORIDE 0.9 % IV SOLN: INTRAVENOUS | Status: DC

## 2016-10-09 MED ORDER — MIDAZOLAM HCL 2 MG/2ML IJ SOLN
INTRAMUSCULAR | Status: AC
  Filled 2016-10-09: qty 2

## 2016-10-09 MED ORDER — 0.9 % SODIUM CHLORIDE (POUR BTL) OPTIME
TOPICAL | Status: DC | PRN
  Administered 2016-10-09: 1000 mL

## 2016-10-09 SURGICAL SUPPLY — 48 items
APL SKNCLS STERI-STRIP NONHPOA (GAUZE/BANDAGES/DRESSINGS) ×1
APPLIER CLIP 5 13 M/L LIGAMAX5 (MISCELLANEOUS) ×3
BAG SPEC RTRVL 10 TROC 200 (ENDOMECHANICALS) ×1
BANDAGE ADH SHEER 1  50/CT (GAUZE/BANDAGES/DRESSINGS) ×3 IMPLANT
BENZOIN TINCTURE PRP APPL 2/3 (GAUZE/BANDAGES/DRESSINGS) ×3 IMPLANT
BLADE CLIPPER SURG (BLADE) IMPLANT
CANISTER SUCT 3000ML PPV (MISCELLANEOUS) ×3 IMPLANT
CHLORAPREP W/TINT 26ML (MISCELLANEOUS) ×3 IMPLANT
CLIP APPLIE 5 13 M/L LIGAMAX5 (MISCELLANEOUS) ×1 IMPLANT
CLOSURE WOUND 1/2 X4 (GAUZE/BANDAGES/DRESSINGS) ×1
COVER MAYO STAND STRL (DRAPES) ×3 IMPLANT
COVER SURGICAL LIGHT HANDLE (MISCELLANEOUS) ×3 IMPLANT
DRAPE C-ARM 42X72 X-RAY (DRAPES) ×3 IMPLANT
DRSG TEGADERM 2-3/8X2-3/4 SM (GAUZE/BANDAGES/DRESSINGS) ×3 IMPLANT
DRSG TEGADERM 4X4.75 (GAUZE/BANDAGES/DRESSINGS) ×3 IMPLANT
ELECT REM PT RETURN 9FT ADLT (ELECTROSURGICAL) ×3
ELECTRODE REM PT RTRN 9FT ADLT (ELECTROSURGICAL) ×1 IMPLANT
ENDOLOOP SUT PDS II  0 18 (SUTURE) ×2
ENDOLOOP SUT PDS II 0 18 (SUTURE) ×1 IMPLANT
GAUZE SPONGE 2X2 8PLY STRL LF (GAUZE/BANDAGES/DRESSINGS) ×1 IMPLANT
GLOVE BIOGEL M STRL SZ7.5 (GLOVE) ×3 IMPLANT
GLOVE BIOGEL PI IND STRL 8 (GLOVE) ×2 IMPLANT
GLOVE BIOGEL PI INDICATOR 8 (GLOVE) ×4
GOWN STRL REUS W/ TWL LRG LVL3 (GOWN DISPOSABLE) ×3 IMPLANT
GOWN STRL REUS W/TWL 2XL LVL3 (GOWN DISPOSABLE) ×3 IMPLANT
GOWN STRL REUS W/TWL LRG LVL3 (GOWN DISPOSABLE) ×6
GRASPER SUT TROCAR 14GX15 (MISCELLANEOUS) IMPLANT
KIT BASIN OR (CUSTOM PROCEDURE TRAY) ×3 IMPLANT
KIT ROOM TURNOVER OR (KITS) ×3 IMPLANT
NS IRRIG 1000ML POUR BTL (IV SOLUTION) ×3 IMPLANT
PAD ARMBOARD 7.5X6 YLW CONV (MISCELLANEOUS) ×3 IMPLANT
POUCH RETRIEVAL ECOSAC 10 (ENDOMECHANICALS) ×1 IMPLANT
POUCH RETRIEVAL ECOSAC 10MM (ENDOMECHANICALS) ×2
SCISSORS LAP 5X35 DISP (ENDOMECHANICALS) ×3 IMPLANT
SET CHOLANGIOGRAPH 5 50 .035 (SET/KITS/TRAYS/PACK) ×3 IMPLANT
SET IRRIG TUBING LAPAROSCOPIC (IRRIGATION / IRRIGATOR) ×3 IMPLANT
SLEEVE ENDOPATH XCEL 5M (ENDOMECHANICALS) ×6 IMPLANT
SPECIMEN JAR SMALL (MISCELLANEOUS) ×3 IMPLANT
SPONGE GAUZE 2X2 STER 10/PKG (GAUZE/BANDAGES/DRESSINGS) ×2
STRIP CLOSURE SKIN 1/2X4 (GAUZE/BANDAGES/DRESSINGS) ×2 IMPLANT
SUT MNCRL AB 4-0 PS2 18 (SUTURE) ×3 IMPLANT
SUT VICRYL 0 UR6 27IN ABS (SUTURE) IMPLANT
TOWEL OR 17X24 6PK STRL BLUE (TOWEL DISPOSABLE) ×3 IMPLANT
TOWEL OR 17X26 10 PK STRL BLUE (TOWEL DISPOSABLE) ×3 IMPLANT
TRAY LAPAROSCOPIC MC (CUSTOM PROCEDURE TRAY) ×3 IMPLANT
TROCAR XCEL BLUNT TIP 100MML (ENDOMECHANICALS) ×3 IMPLANT
TROCAR XCEL NON-BLD 5MMX100MML (ENDOMECHANICALS) ×3 IMPLANT
TUBING INSUFFLATION (TUBING) ×3 IMPLANT

## 2016-10-09 NOTE — Op Note (Signed)
Chad Fitzgerald [id number] Jan 06, 1990 10/09/2016  Laparoscopic Cholecystectomy with IOC Procedure Note  Indications: This patient presents with symptomatic gallbladder disease and will undergo laparoscopic cholecystectomy.  Pre-operative Diagnosis: Calculus of gallbladder without mention of cholecystitis or obstruction  Post-operative Diagnosis: Calculus of gallbladder with other cholecystitis, without mention of obstruction  Surgeon: Atilano Ina   Assistants: none  Anesthesia: General endotracheal anesthesia  Procedure Details  The patient was seen again in the Holding Room. The risks, benefits, complications, treatment options, and expected outcomes were discussed with the patient. The possibilities of reaction to medication, pulmonary aspiration, perforation of viscus, bleeding, recurrent infection, finding a normal gallbladder, the need for additional procedures, failure to diagnose a condition, the possible need to convert to an open procedure, and creating a complication requiring transfusion or operation were discussed with the patient. The likelihood of improving the patient's symptoms with return to their baseline status is good.  The patient and/or family concurred with the proposed plan, giving informed consent. The site of surgery properly noted. The patient was taken to Operating Room, identified as Chad Fitzgerald and the procedure verified as Laparoscopic Cholecystectomy with Intraoperative Cholangiogram. A Time Out was held and the above information confirmed. Antibiotic prophylaxis was administered.   Prior to the induction of general anesthesia, antibiotic prophylaxis was administered. General endotracheal anesthesia was then administered and tolerated well. After the induction, the abdomen was prepped with Chloraprep and draped in the sterile fashion. The patient was positioned in the supine position.  Local anesthetic agent was injected into the skin near the  umbilicus and an incision made. We dissected down to the abdominal fascia with blunt dissection.  The fascia was incised vertically and we entered the peritoneal cavity bluntly.  A pursestring suture of 0-Vicryl was placed around the fascial opening.  The Hasson cannula was inserted and secured with the stay suture.  Pneumoperitoneum was then created with CO2 and tolerated well without any adverse changes in the patient's vital signs. An 5-mm port was placed in the subxiphoid position.  Two 5-mm ports were placed in the right upper quadrant. All skin incisions were infiltrated with a local anesthetic agent before making the incision and placing the trocars.   We positioned the patient in reverse Trendelenburg, tilted slightly to the patient's left. The gallbladder was distended and chronically thickened.  The gallbladder was identified, the fundus grasped and retracted cephalad. Omental Adhesions were lysed bluntly and with the electrocautery where indicated, taking care not to injure any adjacent organs or viscus. The infundibulum was grasped and retracted laterally, exposing the peritoneum overlying the triangle of Calot. This was then divided and exposed in a blunt fashion. A critical view of the cystic duct and cystic artery (appeared to be both a small anterior branch and larger posterior branch) was obtained.  The cystic duct was clearly identified and bluntly dissected circumferentially. The cystic duct was ligated with a clip distally.   An incision was made in the cystic duct and the [facility] cholangiogram catheter introduced after milking a fair amount of stone debris out of the duct. The catheter was secured using a clip. A cholangiogram was then obtained which showed good visualization of the distal and proximal biliary tree with no sign of filling defects or obstruction.  Contrast flowed easily into the duodenum. The catheter was then removed.   The cystic duct was then ligated with clips and divided.  The cystic artery (both the small anterior and larger posterior branch) which had been identified & dissected  free was ligated with clips and divided as well.   The gallbladder was dissected from the liver bed in retrograde fashion with the electrocautery. There was hydrops of the gallbladder. The gallbladder was removed and placed in an Ecco sac.  The gallbladder and Ecco sac were then removed through the umbilical port site. The liver bed was irrigated and inspected. Hemostasis was achieved with the electrocautery. I did elect to place a PDS endoloop around the cystic duct stump since the cystic duct was dilated. Copious irrigation was utilized and was repeatedly aspirated until clear.  The pursestring suture was used to close the umbilical fascia.    We again inspected the right upper quadrant for hemostasis.  The umbilical closure was inspected and there was no air leak and nothing trapped within the closure. Pneumoperitoneum was released as we removed the trocars.  4-0 Monocryl was used to close the skin.   Benzoin, steri-strips, and clean dressings were applied. The patient was then extubated and brought to the recovery room in stable condition. Instrument, sponge, and needle counts were correct at closure and at the conclusion of the case.   Findings: Chronic Cholecystitis with Cholelithiasis; hydrops; +critical view  Estimated Blood Loss: Minimal         Drains: none         Specimens: Gallbladder           Complications: None; patient tolerated the procedure well.         Disposition: PACU - hemodynamically stable.         Condition: stable  Mary Sella. Andrey Campanile, MD, FACS General, Bariatric, & Minimally Invasive Surgery [facility], [place]

## 2016-10-09 NOTE — Transfer of Care (Signed)
Immediate Anesthesia Transfer of Care Note  Patient: Chad Fitzgerald  Procedure(s) Performed: LAPAROSCOPIC CHOLECYSTECTOMY WITH INTRAOPERATIVE CHOLANGIOGRAM (N/A )  Patient Location: PACU  Anesthesia Type:General  Level of Consciousness: awake  Airway & Oxygen Therapy: Patient Spontanous Breathing  Post-op Assessment: Report given to RN and Post -op Vital signs reviewed and stable  Post vital signs: Reviewed and stable  Last Vitals:  Vitals:   10/09/16 1210 10/09/16 1718  BP: 134/90 140/82  Pulse: 63 (!) 105  Resp: 18 12  Temp: 36.6 C (!) 36.4 C  SpO2: 99% 97%    Last Pain:  Vitals:   10/09/16 1718  TempSrc:   PainSc: 5       Patients Stated Pain Goal: 3 (10/09/16 1718)  Complications: No apparent anesthesia complications

## 2016-10-09 NOTE — Anesthesia Procedure Notes (Signed)
Procedure Name: Intubation Date/Time: 10/09/2016 3:32 PM Performed by: Purvis Kilts Pre-anesthesia Checklist: Emergency Drugs available, Patient identified, Suction available, Patient being monitored and Timeout performed Patient Re-evaluated:Patient Re-evaluated prior to induction Oxygen Delivery Method: Circle system utilized Preoxygenation: Pre-oxygenation with 100% oxygen Induction Type: IV induction Ventilation: Mask ventilation without difficulty Laryngoscope Size: Mac and 4 Grade View: Grade II Tube type: Oral Tube size: 7.5 mm Number of attempts: 1 Airway Equipment and Method: Stylet Placement Confirmation: ETT inserted through vocal cords under direct vision and breath sounds checked- equal and bilateral Secured at: 21 cm Tube secured with: Tape Dental Injury: Teeth and Oropharynx as per pre-operative assessment

## 2016-10-09 NOTE — Anesthesia Preprocedure Evaluation (Signed)
Anesthesia Evaluation  Patient identified by MRN, date of birth, ID band Patient awake    Reviewed: Allergy & Precautions, H&P , Patient's Chart, lab work & pertinent test results, reviewed documented beta blocker date and time   Airway Mallampati: II  TM Distance: >3 FB Neck ROM: full    Dental no notable dental hx.    Pulmonary former smoker,    Pulmonary exam normal breath sounds clear to auscultation       Cardiovascular hypertension, + Past MI   Rhythm:regular Rate:Normal     Neuro/Psych    GI/Hepatic   Endo/Other    Renal/GU      Musculoskeletal   Abdominal   Peds  Hematology   Anesthesia Other Findings EF 40%  Reproductive/Obstetrics                             Anesthesia Physical Anesthesia Plan  ASA: II  Anesthesia Plan: General   Post-op Pain Management:    Induction: Intravenous  PONV Risk Score and Plan:   Airway Management Planned: Oral ETT  Additional Equipment:   Intra-op Plan:   Post-operative Plan: Extubation in OR  Informed Consent: I have reviewed the patients History and Physical, chart, labs and discussed the procedure including the risks, benefits and alternatives for the proposed anesthesia with the patient or authorized representative who has indicated his/her understanding and acceptance.   Dental Advisory Given  Plan Discussed with: CRNA and Surgeon  Anesthesia Plan Comments: (  )        Anesthesia Quick Evaluation

## 2016-10-09 NOTE — H&P (Signed)
Chad Fitzgerald is an 26 y.o. male.   Chief Complaint: here for surgery HPI: 26 yo presents for elective cholecystectomy. Initially seen in clinic during summer 2018.  Represents 1 addl trip to an ER for same pain and was out of pain medication. O/w no changes.   The patient is a 26 year old male who presents for evaluation of gall stones. He is referred by Dr. Rex Kras for evaluation of gallstone disease. He reports that he has had several episodes where he has had left-sided mainly upper quadrant abdominal pain that will last for a few hours and then will go away. It is not necessarily related to any food intake. He has not found anything that makes it worse per se. He ended up going to the emergency room on June 23 complaining of chest and upper abdominal pain along with upper back pain and nausea. An extensive cardiac workup was done along with a cardiology consult given his cardiac history. He had a myocardial infarction in 2016. He had been taking some Adderall in the setting of untreated hypertension. He underwent placement of a stent in his LAD. It appears that he had intermittent follow-up with cardiology afterwards. He did take an antiplatelet agent for around a year. In the ER a CT scan of his chest was unremarkable except for evidence of a gallstone. He underwent an abdominal ultrasound which confirmed a 1 cm gallstone in the neck. Labs were unremarkable except for a white blood cell count 13,000. His lipase was normal. He ended up going to see primary care around July 3 with similar complaints of upper abdominal discomfort that was lasting for a few hours. He has had GERD in the past but he states this discomfort is completely different. He denies any NSAID use. He does not smoke. He reports no diarrhea or constipation except when he was taking the pain medication given to him for the abdominal pain.   Past Medical History:  Diagnosis Date  . ADD (attention deficit  disorder)   . Anxiety   . Asthma    as a child  . CAD (coronary artery disease)    a. 04/28/14: acute anterolateral STEMI s/p DES to LAD  . Gallstones   . GERD (gastroesophageal reflux disease)   . Gout   . Hyperglycemia    a. 04/2014: Hg A1c 5.3  . Hypertension   . Ischemic cardiomyopathy    a. 04/29/13: 2D ECHO: LVEF 35-40%  . Low serum HDL   . MI (myocardial infarction) ([place])   . Wears glasses    astigmatism    Past Surgical History:  Procedure Laterality Date  . APPENDECTOMY    . CARDIAC CATHETERIZATION N/A 12/24/2014   Procedure: Left Heart Cath and Coronary Angiography;  Surgeon: Jolaine Artist, MD;  Location: [facility];  Service: Cardiovascular;  Laterality: N/A;  . LEFT HEART CATH N/A 04/28/2014   Procedure: LEFT HEART CATH;  Surgeon: Lorretta Harp, MD;  Location[facility];  Service: Cardiovascular;  Laterality: N/A;    Family History  Problem Relation Age of Onset  . Hypertension Father   . Heart attack Neg Hx   . Stroke Neg Hx    Social History:  reports that he quit smoking about 7 years ago. His smoking use included Cigarettes. He has never used smokeless tobacco. He reports that he drinks alcohol. He reports that he does not use drugs.  Allergies:  Allergies  Allergen Reactions  . Bee Pollen Hives, Itching  and Rash    Medications Prior to Admission  Medication Sig Dispense Refill  . aspirin 81 MG EC tablet Take 1 tablet (81 mg total) by mouth daily. (Patient taking differently: Take 81 mg by mouth daily at 2 PM. ) 90 tablet 3  . traMADol (ULTRAM) 50 MG tablet Take 1 tablet (50 mg total) by mouth every 6 (six) hours as needed. (Patient taking differently: Take 50 mg by mouth every 6 (six) hours as needed (for pain.). ) 15 tablet 0  . atorvastatin (LIPITOR) 40 MG tablet Take 1 tablet (40 mg total) by mouth daily at 6 PM. (Patient not taking: Reported on 10/07/2016) 90 tablet 3  . carvedilol (COREG) 3.125 MG tablet Take 1 tablet (3.125 mg total)  by mouth 2 (two) times daily with a meal. (Patient not taking: Reported on 10/07/2016) 180 tablet 3  . docusate sodium (COLACE) 100 MG capsule Take 1 capsule (100 mg total) by mouth 2 (two) times daily. (Patient not taking: Reported on 10/07/2016) 10 capsule 0  . HYDROcodone-acetaminophen (NORCO) 5-325 MG tablet Take 1 tablet by mouth every 6 (six) hours as needed for severe pain (May cause constipation). For severe pain only. Do not mix with alcohol, benzodiazepines, muscle relaxer. No refills without office visit. (Patient not taking: Reported on 10/07/2016) 12 tablet 0  . ibuprofen (ADVIL,MOTRIN) 800 MG tablet Take 1 tablet (800 mg total) by mouth 3 (three) times daily. (Patient not taking: Reported on 10/07/2016) 21 tablet 0  . ondansetron (ZOFRAN ODT) 4 MG disintegrating tablet Take 1 tablet (4 mg total) by mouth every 8 (eight) hours as needed for nausea or vomiting. (Patient not taking: Reported on 10/07/2016) 20 tablet 0  . oxyCODONE-acetaminophen (PERCOCET) 5-325 MG tablet Take 1-2 tablets by mouth every 6 (six) hours as needed. (Patient not taking: Reported on 10/07/2016) 12 tablet 0  . promethazine (PHENERGAN) 25 MG tablet Take 1 tablet (25 mg total) by mouth every 6 (six) hours as needed for nausea or vomiting. 8 tablet 0    Results for orders placed or performed during the hospital encounter of 10/09/16 (from the past 48 hour(s))  CBC     Status: Abnormal   Collection Time: 10/09/16 12:30 PM  Result Value Ref Range   WBC 9.7 4.0 - 10.5 K/uL   RBC 5.92 (H) 4.22 - 5.81 MIL/uL   Hemoglobin 17.7 (H) 13.0 - 17.0 g/dL   HCT 50.6 39.0 - 52.0 %   MCV 85.5 78.0 - 100.0 fL   MCH 29.9 26.0 - 34.0 pg   MCHC 35.0 30.0 - 36.0 g/dL   RDW 13.1 11.5 - 15.5 %   Platelets 278 150 - 400 K/uL  Basic metabolic panel     Status: Abnormal   Collection Time: 10/09/16 12:30 PM  Result Value Ref Range   Sodium 137 135 - 145 mmol/L   Potassium 4.0 3.5 - 5.1 mmol/L    Comment: SLIGHT HEMOLYSIS   Chloride 110  101 - 111 mmol/L   CO2 19 (L) 22 - 32 mmol/L   Glucose, Bld 86 65 - 99 mg/dL   BUN 11 6 - 20 mg/dL   Creatinine, Ser 0.87 0.61 - 1.24 mg/dL   Calcium 9.1 8.9 - 10.3 mg/dL   GFR calc non Af Amer >60 >60 mL/min   GFR calc Af Amer >60 >60 mL/min    Comment: (NOTE) The eGFR has been calculated using the CKD EPI equation. This calculation has not been validated in all clinical situations. eGFR's  persistently <60 mL/min signify possible Chronic Kidney Disease.    Anion gap 8 5 - 15   No results found.  Review of Systems  Constitutional: Negative for weight loss.  HENT: Negative for nosebleeds.   Eyes: Negative for blurred vision.  Respiratory: Negative for shortness of breath.   Cardiovascular: Negative for chest pain, palpitations, orthopnea and PND.       Denies DOE  Gastrointestinal: Positive for abdominal pain and nausea.  Genitourinary: Negative for dysuria and hematuria.  Musculoskeletal: Negative.   Skin: Negative for itching and rash.  Neurological: Negative for dizziness, focal weakness, seizures, loss of consciousness and headaches.       Denies TIAs, amaurosis fugax  Endo/Heme/Allergies: Does not bruise/bleed easily.  Psychiatric/Behavioral: The patient is not nervous/anxious.     Blood pressure 134/90, pulse 63, temperature 97.9 F (36.6 C), temperature source Oral, resp. rate 18, height '5\' 7"'  (1.702 m), weight 79.4 kg (175 lb), SpO2 99 %. Physical Exam  Vitals reviewed. Constitutional: He is oriented to person, place, and time. He appears well-developed and well-nourished. No distress.  HENT:  Head: Normocephalic and atraumatic.  Right Ear: External ear normal.  Left Ear: External ear normal.  Eyes: Conjunctivae are normal. No scleral icterus.  Neck: Normal range of motion. Neck supple. No tracheal deviation present. No thyromegaly present.  Cardiovascular: Normal rate and normal heart sounds.   Respiratory: Effort normal and breath sounds normal. No stridor. No  respiratory distress. He has no wheezes.  GI: Soft. There is no tenderness. There is no rebound and no guarding.  Musculoskeletal: He exhibits no edema or tenderness.  Lymphadenopathy:    He has no cervical adenopathy.  Neurological: He is alert and oriented to person, place, and time. He exhibits normal muscle tone.  Skin: Skin is warm and dry. No rash noted. He is not diaphoretic. No erythema. No pallor.  Psychiatric: He has a normal mood and affect. His behavior is normal. Judgment and thought content normal.     Assessment/Plan Chronic calculous cholecystitis Cad htn Obesity Asthma Hypercholesterolemia  To or for lap chole poss ioc All questions asked and answered  Leighton Ruff. Redmond Pulling, MD, [place], Bariatric, & Minimally Invasive Surgery [facility], [place]   Gayland Curry, MD 10/09/2016, 3:18 PM

## 2016-10-09 NOTE — Discharge Instructions (Signed)
[facility] LAPAROSCOPIC SURGERY: POST OP INSTRUCTIONS Always review your discharge instruction sheet given to you by the facility where your surgery was performed. IF YOU HAVE DISABILITY OR FAMILY LEAVE FORMS, YOU MUST BRING THEM TO THE OFFICE FOR PROCESSING.   DO NOT GIVE THEM TO YOUR DOCTOR.  1. A prescription for pain medication may be given to you upon discharge.  Take your pain medication as prescribed, if needed.  If narcotic pain medicine is not needed, then you may take acetaminophen (Tylenol) AND/or ibuprofen (Advil) as needed. 2. Take your usually prescribed medications unless otherwise directed. 3. If you need a refill on your pain medication, please contact your pharmacy.  They will contact our office to request authorization. Prescriptions will not be filled after 5pm or on week-ends. 4. You should follow a light diet the first few days after arrival home, such as soup and crackers, etc.  Be sure to include lots of fluids daily. 5. Most patients will experience some swelling and bruising in the area of the incisions.  Ice packs will help.  Swelling and bruising can take several days to resolve.  6. It is common to experience some constipation if taking pain medication after surgery.  Increasing fluid intake and taking a stool softener (such as Colace) will usually help or prevent this problem from occurring.  A mild laxative (Milk of Magnesia or Miralax) should be taken according to package instructions if there are no bowel movements after 48 hours. 7. Unless discharge instructions indicate otherwise, you may remove your bandages 48 hours after surgery, and you may shower at that time.  You may have steri-strips (small skin tapes) in place directly over the incision.  These strips should be left on the skin for 7-10 days.   8. ACTIVITIES:  You may resume regular (light) daily activities beginning the next day--such as daily self-care, walking, climbing  stairs--gradually increasing activities as tolerated.  You may have sexual intercourse when it is comfortable.  Refrain from any heavy lifting or straining until approved by your doctor. a. You may drive when you are no longer taking prescription pain medication, you can comfortably wear a seatbelt, and you can safely maneuver your car and apply brakes. 9. You should see your doctor in the office for a follow-up appointment approximately 2-3 weeks after your surgery.  Make sure that you call for this appointment within a day or two after you arrive home to insure a convenient appointment time. 10. OTHER INSTRUCTIONS:  WHEN TO CALL YOUR DOCTOR: 1. Fever over 101.0 2. Inability to urinate 3. Continued bleeding from incision. 4. Increased pain, redness, or drainage from the incision. 5. Increasing abdominal pain  The clinic staff is available to answer your questions during regular business hours.  Please dont hesitate to call and ask to speak to one of the nurses for clinical concerns.  If you have a medical emergency, go to the nearest emergency room or call 911.  A surgeon from [facility] is always on call at the hospital. [address], [address], [place], [place]  [address] ? [address] [address], [place], [place]   [address] [phone] ? [phone] ? FAX [phone] Web site: [email or web address]

## 2016-10-10 NOTE — Anesthesia Postprocedure Evaluation (Signed)
Anesthesia Post Note  Patient: Chad Fitzgerald  Procedure(s) Performed: LAPAROSCOPIC CHOLECYSTECTOMY WITH INTRAOPERATIVE CHOLANGIOGRAM (N/A )     Patient location during evaluation: PACU Anesthesia Type: General Level of consciousness: awake and alert Pain management: pain level controlled Vital Signs Assessment: post-procedure vital signs reviewed and stable Respiratory status: spontaneous breathing, nonlabored ventilation, respiratory function stable and patient connected to nasal cannula oxygen Cardiovascular status: blood pressure returned to baseline and stable Postop Assessment: no apparent nausea or vomiting Anesthetic complications: no    Last Vitals:  Vitals:   10/09/16 1818 10/09/16 1830  BP: (!) 140/92 (!) 141/83  Pulse: 78 83  Resp: 11 15  Temp:  (!) 36.3 C  SpO2: 96% 97%    Last Pain:  Vitals:   10/09/16 1838  TempSrc:   PainSc: 5                  [name]

## 2016-10-11 ENCOUNTER — Encounter (HOSPITAL_COMMUNITY): Payer: Self-pay | Admitting: General Surgery

## 2017-04-09 ENCOUNTER — Emergency Department (HOSPITAL_COMMUNITY)
Admission: EM | Admit: 2017-04-09 | Discharge: 2017-04-09 | Disposition: A | Discharge status: Home / Self Care | Payer: No Typology Code available for payment source | Attending: Emergency Medicine | Admitting: Emergency Medicine

## 2017-04-09 ENCOUNTER — Other Ambulatory Visit: Payer: Self-pay

## 2017-04-09 ENCOUNTER — Emergency Department (HOSPITAL_COMMUNITY): Payer: No Typology Code available for payment source

## 2017-04-09 DIAGNOSIS — Z87891 Personal history of nicotine dependence: Secondary | ICD-10-CM | POA: Diagnosis not present

## 2017-04-09 DIAGNOSIS — I10 Essential (primary) hypertension: Secondary | ICD-10-CM | POA: Diagnosis not present

## 2017-04-09 DIAGNOSIS — S161XXA Strain of muscle, fascia and tendon at neck level, initial encounter: Secondary | ICD-10-CM | POA: Insufficient documentation

## 2017-04-09 DIAGNOSIS — Z7982 Long term (current) use of aspirin: Secondary | ICD-10-CM | POA: Diagnosis not present

## 2017-04-09 DIAGNOSIS — I2510 Atherosclerotic heart disease of native coronary artery without angina pectoris: Secondary | ICD-10-CM | POA: Diagnosis not present

## 2017-04-09 DIAGNOSIS — Y9389 Activity, other specified: Secondary | ICD-10-CM | POA: Diagnosis not present

## 2017-04-09 DIAGNOSIS — S199XXA Unspecified injury of neck, initial encounter: Secondary | ICD-10-CM | POA: Diagnosis present

## 2017-04-09 DIAGNOSIS — Y998 Other external cause status: Secondary | ICD-10-CM | POA: Insufficient documentation

## 2017-04-09 DIAGNOSIS — H538 Other visual disturbances: Secondary | ICD-10-CM | POA: Diagnosis not present

## 2017-04-09 DIAGNOSIS — Y929 Unspecified place or not applicable: Secondary | ICD-10-CM | POA: Diagnosis not present

## 2017-04-09 DIAGNOSIS — R0789 Other chest pain: Secondary | ICD-10-CM | POA: Diagnosis not present

## 2017-04-09 DIAGNOSIS — S060X0A Concussion without loss of consciousness, initial encounter: Secondary | ICD-10-CM | POA: Diagnosis not present

## 2017-04-09 DIAGNOSIS — I252 Old myocardial infarction: Secondary | ICD-10-CM | POA: Insufficient documentation

## 2017-04-09 LAB — BASIC METABOLIC PANEL
ANION GAP: 12 (ref 5–15)
BUN: 12 mg/dL (ref 6–20)
CO2: 24 mmol/L (ref 22–32)
Calcium: 9.7 mg/dL (ref 8.9–10.3)
Chloride: 104 mmol/L (ref 101–111)
Creatinine, Ser: 1.03 mg/dL (ref 0.61–1.24)
Glucose, Bld: 102 mg/dL — ABNORMAL HIGH (ref 65–99)
POTASSIUM: 4 mmol/L (ref 3.5–5.1)
Sodium: 140 mmol/L (ref 135–145)

## 2017-04-09 LAB — I-STAT TROPONIN, ED: Troponin i, poc: 0.01 ng/mL (ref 0.00–0.08)

## 2017-04-09 LAB — CBC
HEMATOCRIT: 49.7 % (ref 39.0–52.0)
HEMOGLOBIN: 17.3 g/dL — AB (ref 13.0–17.0)
MCH: 30.7 pg (ref 26.0–34.0)
MCHC: 34.8 g/dL (ref 30.0–36.0)
MCV: 88.1 fL (ref 78.0–100.0)
Platelets: 293 10*3/uL (ref 150–400)
RBC: 5.64 MIL/uL (ref 4.22–5.81)
RDW: 13.2 % (ref 11.5–15.5)
WBC: 9.5 10*3/uL (ref 4.0–10.5)

## 2017-04-09 MED ORDER — IBUPROFEN 400 MG PO TABS
600.0000 mg | ORAL_TABLET | Freq: Once | ORAL | Status: AC
  Administered 2017-04-09: 17:00:00 600 mg via ORAL
  Filled 2017-04-09: qty 1

## 2017-04-09 MED ORDER — CYCLOBENZAPRINE HCL 10 MG PO TABS
10.0000 mg | ORAL_TABLET | Freq: Once | ORAL | Status: AC
  Administered 2017-04-09: 10 mg via ORAL
  Filled 2017-04-09: qty 1

## 2017-04-09 MED ORDER — IBUPROFEN 600 MG PO TABS: 600.0000 mg | ORAL_TABLET | Freq: Four times a day (QID) | ORAL | 0 refills | Status: AC | PRN

## 2017-04-09 MED ORDER — CYCLOBENZAPRINE HCL 10 MG PO TABS: 10.0000 mg | ORAL_TABLET | Freq: Two times a day (BID) | ORAL | 0 refills | Status: AC | PRN
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
## 2017-07-11 ENCOUNTER — Emergency Department (HOSPITAL_COMMUNITY)
Admission: EM | Admit: 2017-07-11 | Discharge: 2017-07-11 | Disposition: A | Discharge status: Home / Self Care | Payer: Self-pay | Attending: Emergency Medicine | Admitting: Emergency Medicine

## 2017-07-11 ENCOUNTER — Encounter (HOSPITAL_COMMUNITY): Payer: Self-pay | Admitting: Emergency Medicine

## 2017-07-11 DIAGNOSIS — J45909 Unspecified asthma, uncomplicated: Secondary | ICD-10-CM | POA: Insufficient documentation

## 2017-07-11 DIAGNOSIS — R519 Headache, unspecified: Secondary | ICD-10-CM

## 2017-07-11 DIAGNOSIS — I2510 Atherosclerotic heart disease of native coronary artery without angina pectoris: Secondary | ICD-10-CM | POA: Insufficient documentation

## 2017-07-11 DIAGNOSIS — Z87891 Personal history of nicotine dependence: Secondary | ICD-10-CM | POA: Insufficient documentation

## 2017-07-11 DIAGNOSIS — I10 Essential (primary) hypertension: Secondary | ICD-10-CM | POA: Insufficient documentation

## 2017-07-11 DIAGNOSIS — R51 Headache: Secondary | ICD-10-CM | POA: Insufficient documentation

## 2017-07-11 DIAGNOSIS — K068 Other specified disorders of gingiva and edentulous alveolar ridge: Secondary | ICD-10-CM | POA: Insufficient documentation

## 2017-07-11 MED ORDER — DIPHENHYDRAMINE HCL 50 MG/ML IJ SOLN
25.0000 mg | Freq: Once | INTRAMUSCULAR | Status: AC
  Administered 2017-07-11: 25 mg via INTRAVENOUS
  Filled 2017-07-11: qty 1

## 2017-07-11 MED ORDER — METOCLOPRAMIDE HCL 5 MG/ML IJ SOLN
10.0000 mg | Freq: Once | INTRAMUSCULAR | Status: AC
  Administered 2017-07-11: 10 mg via INTRAVENOUS
  Filled 2017-07-11: qty 2

## 2017-07-11 MED ORDER — SODIUM CHLORIDE 0.9 % IV BOLUS
500.0000 mL | Freq: Once | INTRAVENOUS | Status: AC
  Administered 2017-07-11: 500 mL via INTRAVENOUS

## 2017-07-11 NOTE — ED Triage Notes (Addendum)
Pt c/o severe frontal HA onset 5 hrs ago, pt reports not sleeping well x 2 days, pt reports dizziness onset with HA, +photophobia.  Pt reports hx of HTN but states he is not prescribed anything at this time.  Pt states he took someone elses narcotics 2 days ago to help him sleep.

## 2017-07-11 NOTE — Discharge Instructions (Signed)
Please read and follow all provided instructions.  Your diagnoses today include:  1. Acute nonintractable headache, unspecified headache type   2. Pain in gums     Tests performed today include:  Vital signs. See below for your results today.   Medications:  In the Emergency Department you received:  Reglan - antinausea/headache medication  Benadryl - antihistamine to counteract potential side effects of reglan  Take any prescribed medications only as directed.  Additional information:  Follow any educational materials contained in this packet.  You are having a headache. No specific cause was found today for your headache. It may have been a migraine or other cause of headache. Stress, anxiety, fatigue, and depression are common triggers for headaches.   Your headache today does not appear to be life-threatening or require hospitalization, but often the exact cause of headaches is not determined in the emergency department. Therefore, follow-up with your doctor is very important to find out what may have caused your headache and whether or not you need any further diagnostic testing or treatment.   Sometimes headaches can appear benign (not harmful), but then more serious symptoms can develop which should prompt an immediate re-evaluation by your doctor or the emergency department.  BE VERY CAREFUL not to take multiple medicines containing Tylenol (also called acetaminophen). Doing so can lead to an overdose which can damage your liver and cause liver failure and possibly death.   Follow-up instructions: Please follow-up with your primary care provider in the next 3 days for further evaluation of your symptoms.   Return instructions:   Please return to the Emergency Department if you experience worsening symptoms.  Return if the medications do not resolve your headache, if it recurs, or if you have multiple episodes of vomiting or cannot keep down fluids.  Return if you have  a change from the usual headache.  RETURN IMMEDIATELY IF you:  Develop a sudden, severe headache  Develop confusion or become poorly responsive or faint  Develop a fever above 100.44F or problem breathing  Have a change in speech, vision, swallowing, or understanding  Develop new weakness, numbness, tingling, incoordination in your arms or legs  Have a seizure  Please return if you have any other emergent concerns.  Additional Information:  Your vital signs today were: BP (!) 110/58    Pulse 62    Temp 98.2 F (36.8 C) (Oral)    Resp 18    Ht 5\' 8"  (1.727 m)    Wt 77.1 kg (170 lb)    SpO2 97%    BMI 25.85 kg/m  If your blood pressure (BP) was elevated above 135/85 this visit, please have this repeated by your doctor within one month. --------------

## 2017-07-11 NOTE — ED Provider Notes (Signed)
[facility] EMERGENCY DEPARTMENT Provider Note   CSN: [id number] Arrival date & time: 07/11/17  0422     History   Chief Complaint Chief Complaint  Patient presents with  . Headache    HPI Chad Fitzgerald is a 27 y.o. male.  Patient with significant past medical history of STEMI s/p stenting, diabetes, HTN, ischemic cardiomyopathy, NO current anticoagulation -- presents with complaint of severe bilateral frontal headache for the past 3 days.  Patient states that he started by having pain in his gums in his lower jaw with some mild swelling.  No gross abscess or toothache.  He states when this became worse he began having this headache which has associated photophobia and phonophobia.  No confusion, fevers, vomiting.  No neck pain or stiffness.  No sinus pressure.  Denies head injury.  Patient saw a dentist yesterday and was told to have his teeth cleaned and to take ibuprofen for the pain.  He had already been taking Tylenol, aspirin, and ibuprofen without relief.  Patient has not had headaches like this in the past.  The onset of this condition was acute. The course is constant. Aggravating factors: gum pain. Alleviating factors: none.       Past Medical History:  Diagnosis Date  . ADD (attention deficit disorder)   . Anxiety   . Asthma    as a child  . CAD (coronary artery disease)    a. 04/28/14: acute anterolateral STEMI s/p DES to LAD  . Gallstones   . GERD (gastroesophageal reflux disease)   . Gout   . Hyperglycemia    a. 04/2014: Hg A1c 5.3  . Hypertension   . Ischemic cardiomyopathy    a. 04/29/13: 2D ECHO: LVEF 35-40%  . Low serum HDL   . MI (myocardial infarction) (HCC)   . Wears glasses    astigmatism    Patient Active Problem List   Diagnosis Date Noted  . Prothrombin gene mutation (HCC) 01/02/2015  . Anxiety disorder due to general medical condition with panic attack 01/02/2015  . Unstable angina (HCC) 12/24/2014  . Abnormal nuclear  stress test   . Chest pain 12/21/2014  . Low serum HDL   . Hyperglycemia   . Ischemic cardiomyopathy   . CAD S/P LAD PCI - 3 mm x 18 mm long Xience DES (3.3 mm)   . Acute MI, anterolateral wall, subsequent episode of care (HCC) 04/28/2014  . STEMI (ST elevation myocardial infarction) (HCC) 04/28/2014    Past Surgical History:  Procedure Laterality Date  . APPENDECTOMY    . CARDIAC CATHETERIZATION N/A 12/24/2014   Procedure: Left Heart Cath and Coronary Angiography;  Surgeon: Dolores Patty, MD;  Location: [facility] INVASIVE CV LAB;  Service: Cardiovascular;  Laterality: N/A;  . CHOLECYSTECTOMY N/A 10/09/2016   Procedure: LAPAROSCOPIC CHOLECYSTECTOMY WITH INTRAOPERATIVE CHOLANGIOGRAM;  Surgeon: Gaynelle Adu, MD;  Location: [facility] OR;  Service: General;  Laterality: N/A;  . LEFT HEART CATH N/A 04/28/2014   Procedure: LEFT HEART CATH;  Surgeon: Runell Gess, MD;  Location: [facility] CATH LAB;  Service: Cardiovascular;  Laterality: N/A;        Home Medications    Prior to Admission medications   Medication Sig Start Date End Date Taking? Authorizing Provider  aspirin 81 MG EC tablet Take 1 tablet (81 mg total) by mouth daily. Patient taking differently: Take 81 mg by mouth daily at 2 PM.  05/20/16   Bhagat, Bhavinkumar, PA  carvedilol (COREG) 3.125 MG tablet Take 1  tablet (3.125 mg total) by mouth 2 (two) times daily with a meal. Patient not taking: Reported on 10/07/2016 05/20/16   Bhagat, Sharrell Ku, PA  ondansetron (ZOFRAN ODT) 4 MG disintegrating tablet Take 1 tablet (4 mg total) by mouth every 8 (eight) hours as needed for nausea or vomiting. Patient not taking: Reported on 10/07/2016 07/27/16   Mackuen, Courteney Lyn, MD  oxyCODONE-acetaminophen (ROXICET) 5-325 MG tablet Take 1 tablet by mouth every 6 (six) hours as needed. 10/09/16 10/09/17  Gaynelle Adu, MD    Family History Family History  Problem Relation Age of Onset  . Hypertension Father   . Heart attack Neg Hx   . Stroke Neg Hx      Social History Social History   Tobacco Use  . Smoking status: Former Smoker    Types: Cigarettes    Last attempt to quit: 2011    Years since quitting: 8.5  . Smokeless tobacco: Never Used  Substance Use Topics  . Alcohol use: Yes    Comment: occ  . Drug use: No    Frequency: 2.0 times per week     Allergies   Bee pollen   Review of Systems Review of Systems  Constitutional: Negative for fever.  HENT: Positive for dental problem. Negative for congestion, rhinorrhea and sinus pressure.   Eyes: Positive for photophobia. Negative for discharge, redness and visual disturbance.  Respiratory: Negative for shortness of breath.   Cardiovascular: Negative for chest pain.  Gastrointestinal: Negative for nausea and vomiting.  Musculoskeletal: Negative for gait problem, neck pain and neck stiffness.  Skin: Negative for rash.  Neurological: Positive for headaches. Negative for syncope, speech difficulty, weakness, light-headedness and numbness.  Psychiatric/Behavioral: Negative for confusion.     Physical Exam Updated Vital Signs BP (!) 145/105 (BP Location: Right Arm)   Pulse 78   Temp 98.2 F (36.8 C) (Oral)   Resp 18   Ht 5\' 8"  (1.727 m)   Wt 77.1 kg (170 lb)   SpO2 98%   BMI 25.85 kg/m   Physical Exam  Constitutional: He is oriented to person, place, and time. He appears well-developed and well-nourished.  HENT:  Head: Normocephalic and atraumatic.  Right Ear: Tympanic membrane, external ear and ear canal normal.  Left Ear: Tympanic membrane, external ear and ear canal normal.  Nose: Nose normal.  Mouth/Throat: Uvula is midline, oropharynx is clear and moist and mucous membranes are normal.  Mild lower gum swelling and inflammation noted.  No active bleeding.  No gross dental abscess.  Eyes: Pupils are equal, round, and reactive to light. Conjunctivae, EOM and lids are normal.  Neck: Normal range of motion. Neck supple.  Cardiovascular: Normal rate and  regular rhythm.  Pulmonary/Chest: Effort normal and breath sounds normal.  Abdominal: Soft. There is no tenderness.  Musculoskeletal: Normal range of motion.       Cervical back: He exhibits normal range of motion, no tenderness and no bony tenderness.  Neurological: He is alert and oriented to person, place, and time. He has normal strength and normal reflexes. No cranial nerve deficit or sensory deficit. He exhibits normal muscle tone. He displays a negative Romberg sign. Coordination and gait normal. GCS eye subscore is 4. GCS verbal subscore is 5. GCS motor subscore is 6.  Skin: Skin is warm and dry.  Psychiatric: He has a normal mood and affect.  Nursing note and vitals reviewed.    ED Treatments / Results  Labs (all labs ordered are listed, but only abnormal  results are displayed) Labs Reviewed - No data to display  EKG None  Radiology No results found.  Procedures Procedures (including critical care time)  Medications Ordered in ED Medications  metoCLOPramide (REGLAN) injection 10 mg (10 mg Intravenous Given 07/11/17 0726)  diphenhydrAMINE (BENADRYL) injection 25 mg (25 mg Intravenous Given 07/11/17 0726)  sodium chloride 0.9 % bolus 500 mL (0 mLs Intravenous Stopped 07/11/17 0806)     Initial Impression / Assessment and Plan / ED Course  I have reviewed the triage vital signs and the nursing notes.  Pertinent labs & imaging results that were available during my care of the patient were reviewed by me and considered in my medical decision making (see chart for details).     Patient seen and examined. Medications ordered. No cardiac complaints today.   Vital signs reviewed and are as follows: BP (!) 145/105 (BP Location: Right Arm)   Pulse 78   Temp 98.2 F (36.8 C) (Oral)   Resp 18   Ht 5\' 8"  (1.727 m)   Wt 77.1 kg (170 lb)   SpO2 98%   BMI 25.85 kg/m   8:24 AM patient reassessed.  He is feeling much better and was able to sleep.  Neuro exam is unchanged.  We  will discharged home at this time.  Encourage patient to return with worsening headache or new symptoms. Patient counseled to return if they have weakness in their arms or legs, slurred speech, trouble walking or talking, confusion, trouble with their balance, or if they have any other concerns. Patient verbalizes understanding and agrees with plan.    Final Clinical Impressions(s) / ED Diagnoses   Final diagnoses:  Acute nonintractable headache, unspecified headache type  Pain in gums   Patient with cardiac history, not currently on anticoagulation presents with headache.  Headache seems to be stemming from gum pain for which he has seen his dentist.  Patient without high-risk features of headache including: sudden onset/thunderclap HA, altered mental status, accompanying seizure, headache with exertion, age > 104, history of immunocompromise, neck or shoulder pain, fever, use of anticoagulation, family history of spontaneous SAH, concomitant drug use, toxic exposure.   Patient has a normal complete neurological exam, normal vital signs, normal level of consciousness, no signs of meningismus, is well-appearing/non-toxic appearing, no signs of trauma.   Imaging with CT/MRI not indicated given history and physical exam findings.   No dangerous or life-threatening conditions suspected or identified by history, physical exam, and by work-up. No indications for hospitalization identified.    ED Discharge Orders    None       Renne Crigler, Cordelia Poche 07/11/17 0827    Gerhard Munch, MD 07/12/17 1357

## 2017-07-12 ENCOUNTER — Emergency Department (HOSPITAL_COMMUNITY)
Admission: EM | Admit: 2017-07-12 | Discharge: 2017-07-13 | Disposition: A | Discharge status: Home / Self Care | Payer: Self-pay | Attending: Emergency Medicine | Admitting: Emergency Medicine

## 2017-07-12 ENCOUNTER — Other Ambulatory Visit: Payer: Self-pay

## 2017-07-12 DIAGNOSIS — I10 Essential (primary) hypertension: Secondary | ICD-10-CM | POA: Insufficient documentation

## 2017-07-12 DIAGNOSIS — R079 Chest pain, unspecified: Secondary | ICD-10-CM | POA: Insufficient documentation

## 2017-07-12 DIAGNOSIS — Z87891 Personal history of nicotine dependence: Secondary | ICD-10-CM | POA: Insufficient documentation

## 2017-07-12 DIAGNOSIS — R51 Headache: Secondary | ICD-10-CM | POA: Insufficient documentation

## 2017-07-12 DIAGNOSIS — I252 Old myocardial infarction: Secondary | ICD-10-CM | POA: Insufficient documentation

## 2017-07-12 DIAGNOSIS — J45909 Unspecified asthma, uncomplicated: Secondary | ICD-10-CM | POA: Insufficient documentation

## 2017-07-12 DIAGNOSIS — I2510 Atherosclerotic heart disease of native coronary artery without angina pectoris: Secondary | ICD-10-CM | POA: Insufficient documentation

## 2017-07-12 DIAGNOSIS — R519 Headache, unspecified: Secondary | ICD-10-CM

## 2017-07-13 ENCOUNTER — Emergency Department (HOSPITAL_COMMUNITY): Payer: Self-pay

## 2017-07-13 ENCOUNTER — Encounter (HOSPITAL_COMMUNITY): Payer: Self-pay | Admitting: Emergency Medicine

## 2017-07-13 LAB — I-STAT CHEM 8, ED
BUN: 23 mg/dL — ABNORMAL HIGH (ref 6–20)
CHLORIDE: 105 mmol/L (ref 98–111)
Calcium, Ion: 1.25 mmol/L (ref 1.15–1.40)
Creatinine, Ser: 1 mg/dL (ref 0.61–1.24)
GLUCOSE: 84 mg/dL (ref 70–99)
HEMATOCRIT: 51 % (ref 39.0–52.0)
Hemoglobin: 17.3 g/dL — ABNORMAL HIGH (ref 13.0–17.0)
POTASSIUM: 4.4 mmol/L (ref 3.5–5.1)
Sodium: 142 mmol/L (ref 135–145)
TCO2: 26 mmol/L (ref 22–32)

## 2017-07-13 LAB — I-STAT TROPONIN, ED: TROPONIN I, POC: 0 ng/mL (ref 0.00–0.08)

## 2017-07-13 MED ORDER — PROCHLORPERAZINE EDISYLATE 10 MG/2ML IJ SOLN
10.0000 mg | Freq: Once | INTRAMUSCULAR | Status: AC
  Administered 2017-07-13: 10 mg via INTRAVENOUS
  Filled 2017-07-13: qty 2

## 2017-07-13 MED ORDER — DIPHENHYDRAMINE HCL 50 MG/ML IJ SOLN
25.0000 mg | Freq: Once | INTRAMUSCULAR | Status: AC
  Administered 2017-07-13: 25 mg via INTRAVENOUS
  Filled 2017-07-13: qty 1

## 2017-07-13 MED ORDER — DEXAMETHASONE SODIUM PHOSPHATE 10 MG/ML IJ SOLN
10.0000 mg | Freq: Once | INTRAMUSCULAR | Status: AC
  Administered 2017-07-13: 10 mg via INTRAVENOUS
  Filled 2017-07-13: qty 1

## 2017-07-13 NOTE — ED Provider Notes (Signed)
[facility] EMERGENCY DEPARTMENT Provider Note  CSN: [id number] Arrival date & time: 07/12/17 2344  Chief Complaint(s) Headache  HPI Chad Fitzgerald is a 27 y.o. male with extensive past medical history listed below including CAD status post STEMI in 2016 requiring stenting, anxiety, who presents to the emergency department with several days of left-sided temporal headache that is been intermittent in nature.  Patient was seen for the same yesterday and treated with migraine cocktail resulting in resolution of his pain however the pain returned approximately 6 to 8 hours ago.  Patient endorses photo and phonophobia.  Denies any fevers or chills.  No nausea or vomiting.  Endorses intermittent blurry vision.  Endorses recurrent upper back and left-sided chest wall pain similar to prior episodes and not consistent with his prior MI.  Denies any shortness of breath.  No abdominal pain.  No focal deficits.  HPI  Past Medical History Past Medical History:  Diagnosis Date  . ADD (attention deficit disorder)   . Anxiety   . Asthma    as a child  . CAD (coronary artery disease)    a. 04/28/14: acute anterolateral STEMI s/p DES to LAD  . Gallstones   . GERD (gastroesophageal reflux disease)   . Gout   . Hyperglycemia    a. 04/2014: Hg A1c 5.3  . Hypertension   . Ischemic cardiomyopathy    a. 04/29/13: 2D ECHO: LVEF 35-40%  . Low serum HDL   . MI (myocardial infarction) (HCC)   . Wears glasses    astigmatism   Patient Active Problem List   Diagnosis Date Noted  . Prothrombin gene mutation (HCC) 01/02/2015  . Anxiety disorder due to general medical condition with panic attack 01/02/2015  . Unstable angina (HCC) 12/24/2014  . Abnormal nuclear stress test   . Chest pain 12/21/2014  . Low serum HDL   . Hyperglycemia   . Ischemic cardiomyopathy   . CAD S/P LAD PCI - 3 mm x 18 mm long Xience DES (3.3 mm)   . Acute MI, anterolateral wall, subsequent episode of care (HCC)  04/28/2014  . STEMI (ST elevation myocardial infarction) (HCC) 04/28/2014   Home Medication(s) Prior to Admission medications   Medication Sig Start Date End Date Taking? Authorizing Provider  acetaminophen (TYLENOL) 500 MG tablet Take 1,000 mg by mouth every 6 (six) hours as needed for mild pain.    Yes [provider]  ibuprofen (ADVIL,MOTRIN) 200 MG tablet Take 400 mg by mouth every 6 (six) hours as needed for moderate pain.   Yes [provider]  naproxen sodium (ALEVE) 220 MG tablet Take 220 mg by mouth daily as needed (pain).   Yes [provider]                                                                                                                                    Past Surgical History Past Surgical  History:  Procedure Laterality Date  . APPENDECTOMY    . CARDIAC CATHETERIZATION N/A 12/24/2014   Procedure: Left Heart Cath and Coronary Angiography;  Surgeon: Dolores Patty, MD;  Location: [facility] INVASIVE CV LAB;  Service: Cardiovascular;  Laterality: N/A;  . CHOLECYSTECTOMY N/A 10/09/2016   Procedure: LAPAROSCOPIC CHOLECYSTECTOMY WITH INTRAOPERATIVE CHOLANGIOGRAM;  Surgeon: Gaynelle Adu, MD;  Location: [facility] OR;  Service: General;  Laterality: N/A;  . LEFT HEART CATH N/A 04/28/2014   Procedure: LEFT HEART CATH;  Surgeon: Runell Gess, MD;  Location: [facility] CATH LAB;  Service: Cardiovascular;  Laterality: N/A;   Family History Family History  Problem Relation Age of Onset  . Hypertension Father   . Heart attack Neg Hx   . Stroke Neg Hx     Social History Social History   Tobacco Use  . Smoking status: Former Smoker    Types: Cigarettes    Last attempt to quit: 2011    Years since quitting: 8.5  . Smokeless tobacco: Never Used  Substance Use Topics  . Alcohol use: Yes    Comment: occ  . Drug use: No    Frequency: 2.0 times per week   Allergies Bee pollen  Review of Systems Review of Systems All other systems are reviewed and are  negative for acute change except as noted in the HPI  Physical Exam Vital Signs  I have reviewed the triage vital signs BP (!) 148/95 (BP Location: Right Arm)   Pulse 72   Temp 97.9 F (36.6 C) (Oral)   Resp 16   Ht 5\' 8"  (1.727 m)   Wt 77.1 kg (170 lb)   SpO2 100%   BMI 25.85 kg/m   Physical Exam  Constitutional: He is oriented to person, place, and time. He appears well-developed and well-nourished. No distress.  HENT:  Head: Normocephalic and atraumatic.  Nose: Nose normal.  Eyes: Pupils are equal, round, and reactive to light. Conjunctivae and EOM are normal. Right eye exhibits no discharge. Left eye exhibits no discharge. No scleral icterus.  Neck: Normal range of motion. Neck supple.  Cardiovascular: Normal rate and regular rhythm. Exam reveals no gallop and no friction rub.  No murmur heard. Pulmonary/Chest: Effort normal and breath sounds normal. No stridor. No respiratory distress. He has no rales.     He exhibits tenderness.    Abdominal: Soft. He exhibits no distension. There is no tenderness.  Musculoskeletal: He exhibits no edema or tenderness.  Neurological: He is alert and oriented to person, place, and time.  Skin: Skin is warm and dry. No rash noted. He is not diaphoretic. No erythema.  Psychiatric: He has a normal mood and affect.  Vitals reviewed.   ED Results and Treatments Labs (all labs ordered are listed, but only abnormal results are displayed) Labs Reviewed  I-STAT CHEM 8, ED - Abnormal; Notable for the following components:      Result Value   BUN 23 (*)    Hemoglobin 17.3 (*)    All other components within normal limits  I-STAT TROPONIN, ED  EKG  EKG Interpretation  Date/Time:  Tuesday July 13 2017 00:34:09 EDT Ventricular Rate:  65 PR Interval:  150 QRS Duration: 92 QT Interval:  384 QTC Calculation: 399 R  Axis:   49 Text Interpretation:  Normal sinus rhythm with sinus arrhythmia Anteroseptal infarct , age undetermined Abnormal ECG No significant change since last tracing Confirmed by Drema Pry [phone]) on 07/13/2017 3:43:25 AM      Radiology Dg Chest 2 View  Result Date: 07/13/2017 CLINICAL DATA:  Chest pain. EXAM: CHEST - 2 VIEW COMPARISON:  Radiograph 04/09/2017 FINDINGS: The cardiomediastinal contours are normal. The lungs are clear. Pulmonary vasculature is normal. No consolidation, pleural effusion, or pneumothorax. No acute osseous abnormalities are seen. IMPRESSION: No acute pulmonary process. Electronically Signed   By: Rubye Oaks M.D.   On: 07/13/2017 05:07   Pertinent labs & imaging results that were available during my care of the patient were reviewed by me and considered in my medical decision making (see chart for details).  Medications Ordered in ED Medications  diphenhydrAMINE (BENADRYL) injection 25 mg (25 mg Intravenous Given 07/13/17 0427)  dexamethasone (DECADRON) injection 10 mg (10 mg Intravenous Given 07/13/17 0428)  prochlorperazine (COMPAZINE) injection 10 mg (10 mg Intravenous Given 07/13/17 0425)                                                                                                                                    Procedures Procedures  (including critical care time)  Medical Decision Making / ED Course I have reviewed the nursing notes for this encounter and the patient's prior records (if available in EHR or on provided paperwork).    1. Headache Non focal neuro exam. No recent head trauma. No fever. Doubt meningitis. Doubt intracranial bleed. Doubt IIH. No indication for imaging. Will treat with migraine cocktail and reevaluate.  Complete resolution of patient's headache following migraine cocktail.  2. Chest pain Most consistent with chest wall pain.  EKG nonischemic and reassuring. Chest x-ray without evidence suggestive of pneumonia,  pneumothorax, pneumomediastinum.  No abnormal contour of the mediastinum to suggest dissection. No evidence of acute injuries. Trop negative. Doubt cardiac etiology.  Doubt pulmonary embolism.  Presentation not classic for aortic dissection or esophageal perforation.  The patient appears reasonably screened and/or stabilized for discharge and I doubt any other medical condition or other [facility] requiring further screening, evaluation, or treatment in the ED at this time prior to discharge.  The patient is safe for discharge with strict return precautions.    Final Clinical Impression(s) / ED Diagnoses Final diagnoses:  Chest pain  Bad headache   Disposition: Discharge  Condition: Good  I have discussed the results, Dx and Tx plan with the patient who expressed understanding and agree(s) with the plan. Discharge instructions discussed at great length. The patient was given strict return precautions who verbalized understanding of the instructions. No further questions at time of discharge.  ED Discharge Orders    None       Follow Up: Primary care provider   If you do not have a primary care physician, contact [facility] at [phone] for referral      This chart was dictated using voice recognition software.  Despite best efforts to proofread,  errors can occur which can change the documentation meaning.   Nira Conn, MD 07/13/17 [phone]

## 2017-07-13 NOTE — Discharge Instructions (Addendum)
You may use over-the-counter Motrin (Ibuprofen), Acetaminophen (Tylenol), topical muscle creams such as SalonPas, Icy Hot, Bengay, etc. Please stretch, apply heat, and have massage therapy for additional assistance. ° °

## 2017-07-13 NOTE — ED Triage Notes (Addendum)
Pt c/o HA starting in gums and radiating up to head. Pt was seen last night for same. Pt states pain today much worse that last night. Pt states he did get some relief from visit last but pain returned @ 30 min ago as well as some chest discomfort,

## 2017-10-27 IMAGING — US US ABDOMEN COMPLETE
1 series · 14 of 25 positions shown · non-contrast
Comparison: None.

CLINICAL DATA: Back pain abdominal pain since last night.

EXAM:
ABDOMEN ULTRASOUND COMPLETE

[Series 1: us abdomen complete · 0.22mm/px · 14 of 58 slices shown]
[im 1/58]
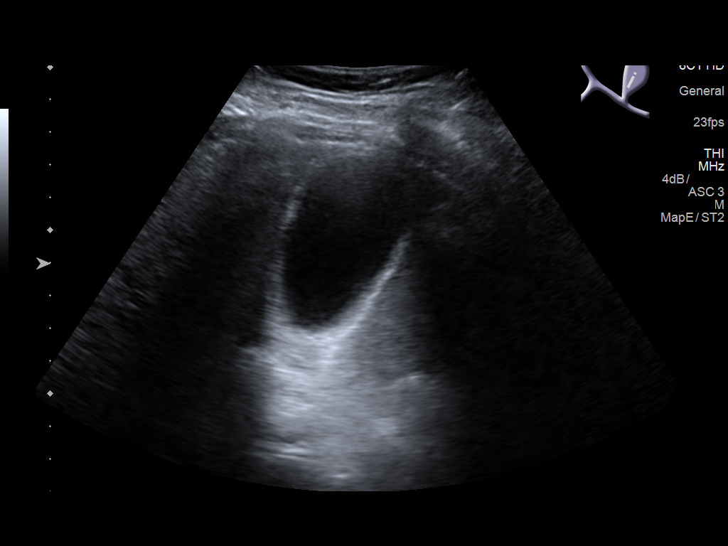
[im 5/58]
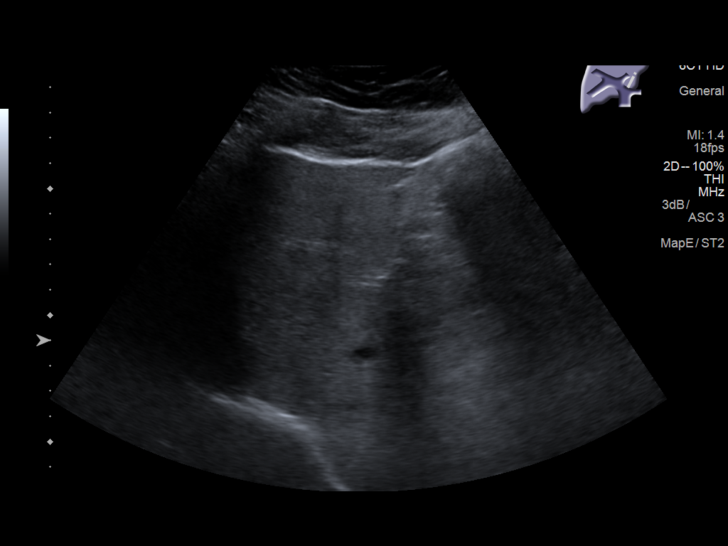
[im 10/58]
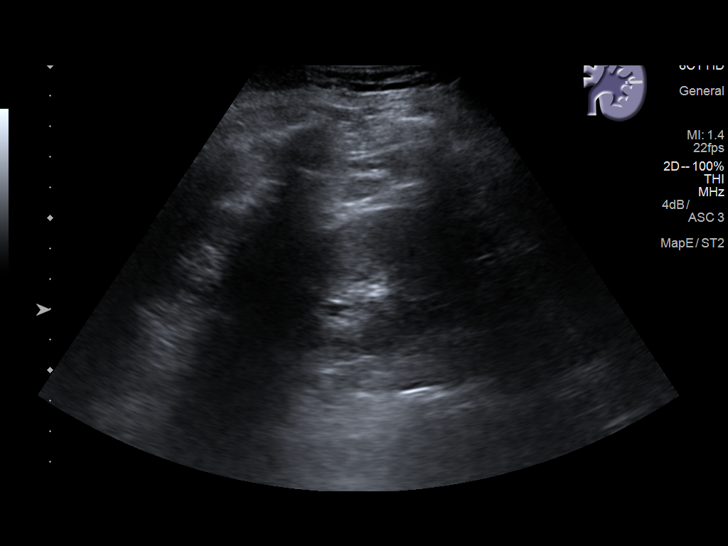
[im 15/58]
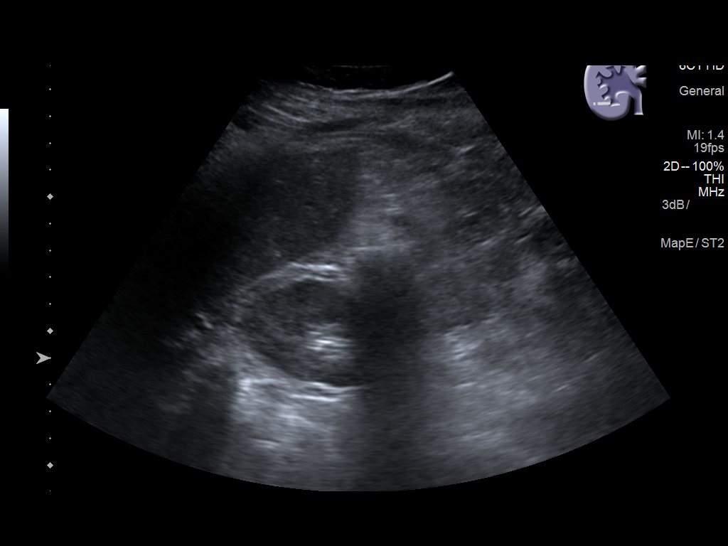
[im 20/58]
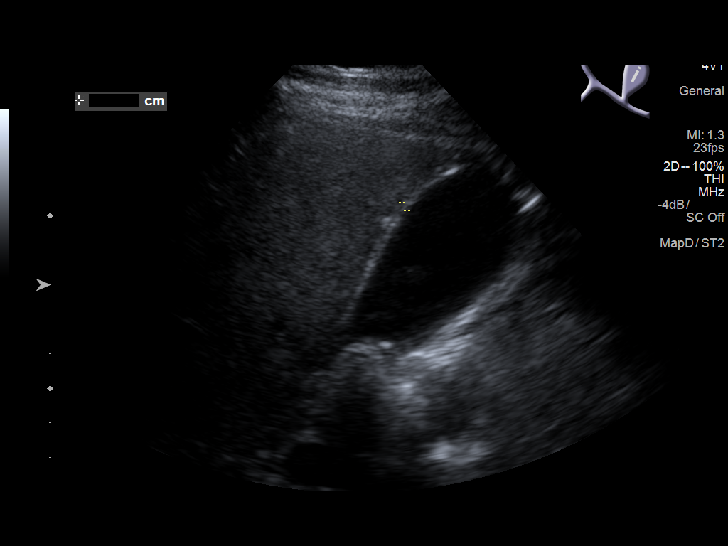
[im 22/58]
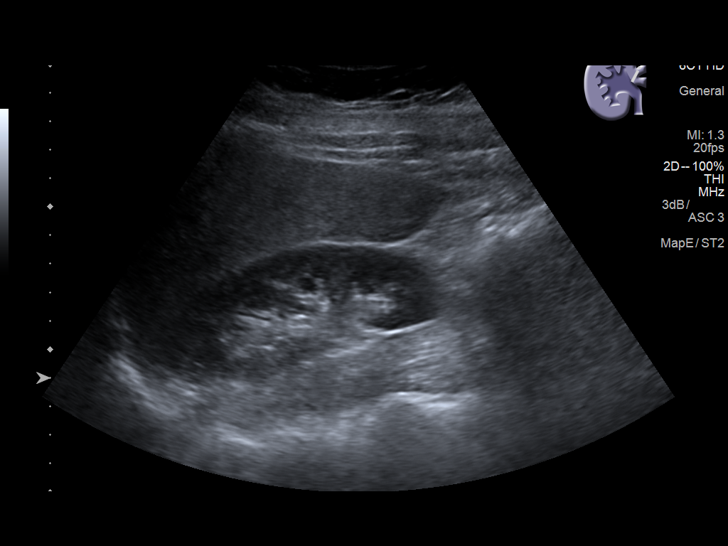
[im 27/58]
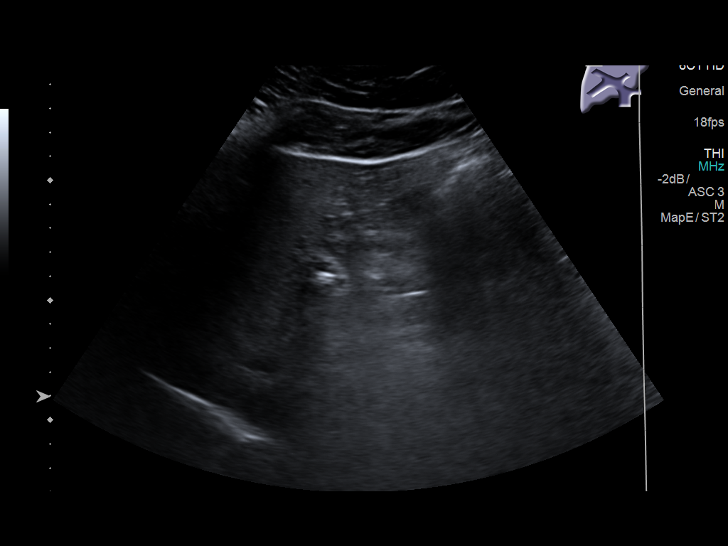
[im 31/58]
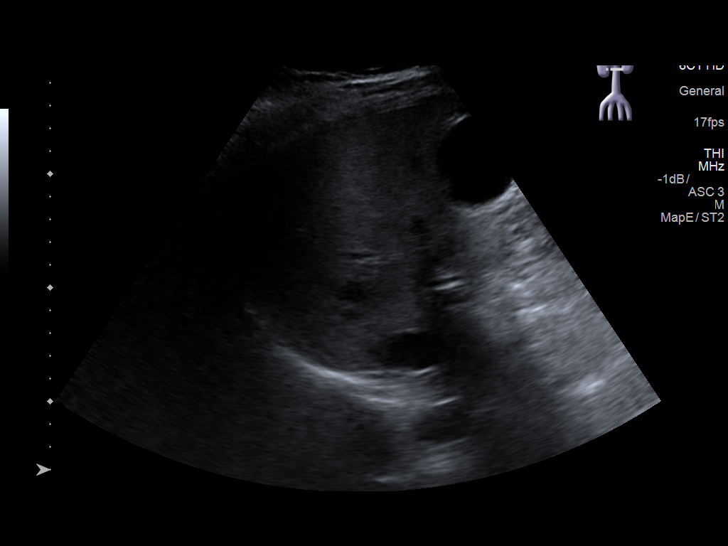
[im 36/58]
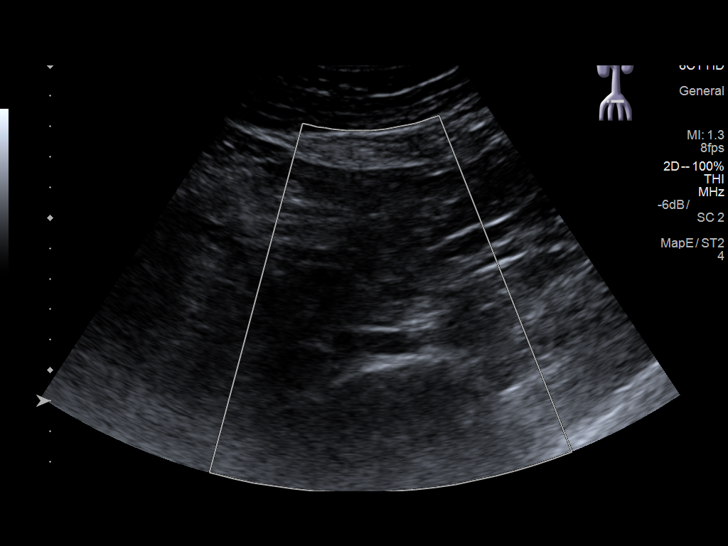
[im 39/58]
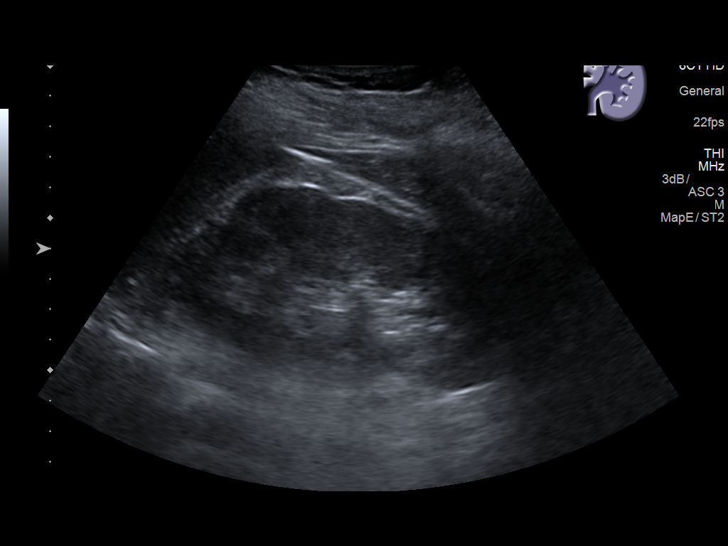
[im 43/58]
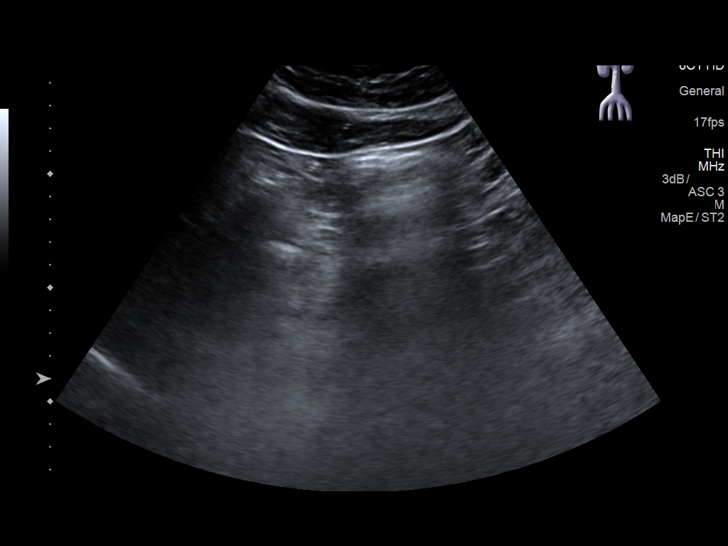
[im 48/58]
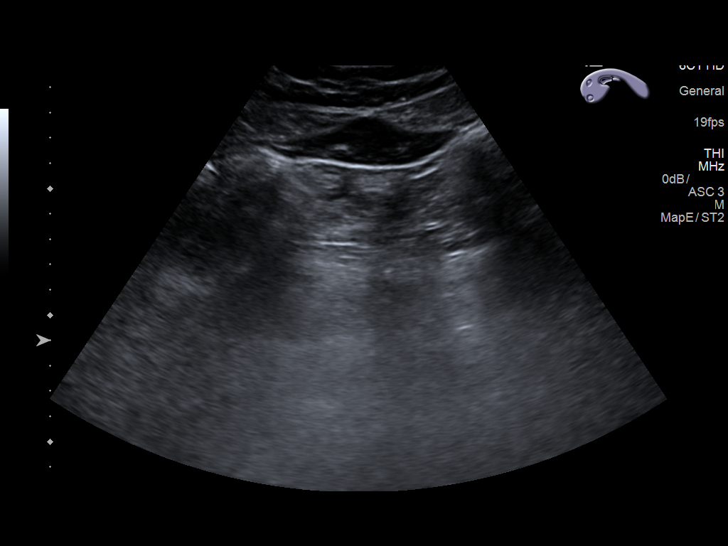
[im 53/58]
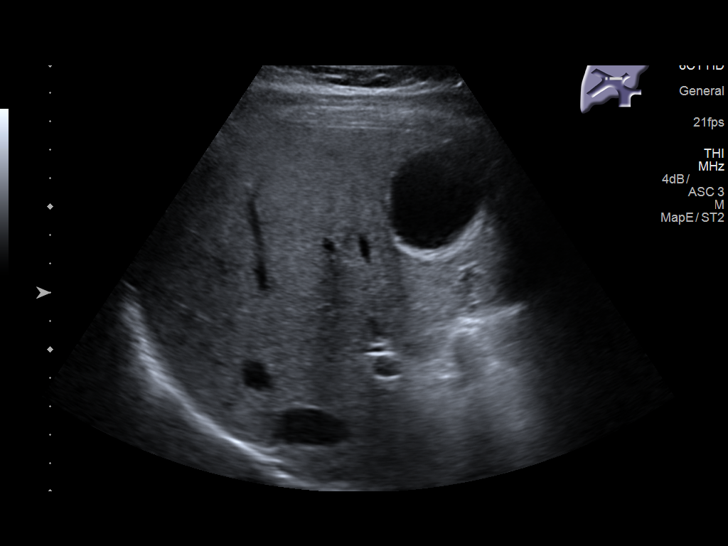
[im 58/58]
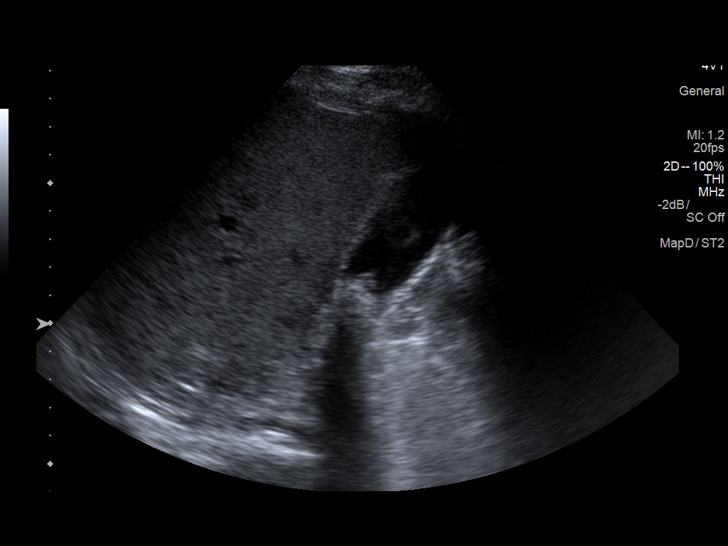

[14 of 25 positions shown; findings below may reference images not displayed]

FINDINGS: Gallbladder: 1 cm shadowing gallstone in the gallbladder neck. No
wall thickening or pericholecystic fluid.

Common bile duct: Diameter: 3.5 mm

Liver: No focal lesion identified. Within normal limits in
parenchymal echogenicity.

IVC: No abnormality visualized.

Pancreas: Not visualized due to overlying midline bowel gas.

Spleen: Size and appearance within normal limits.

Right Kidney: Length: 10.3 cm. Echogenicity within normal limits. No
mass or hydronephrosis visualized.

Left Kidney: Length: 11.7 cm. Echogenicity within normal limits. No
mass or hydronephrosis visualized.

Abdominal aorta: Mid aorta and bifurcation not visualized due to
bowel gas. No aneurysm seen.

Other findings: None.
IMPRESSION: 1. No acute findings.
2. Gallstone with no evidence of acute cholecystitis.
3. Pancreas not visualized due to overlying bowel gas.

## 2018-07-02 IMAGING — DX DG CHEST 1V PORT
1 series · 1 of 1 positions shown · non-contrast
Comparison: 05/05/2016

CLINICAL DATA: Back and chest pain since last night.

EXAM:
PORTABLE CHEST 1 VIEW

[chest]
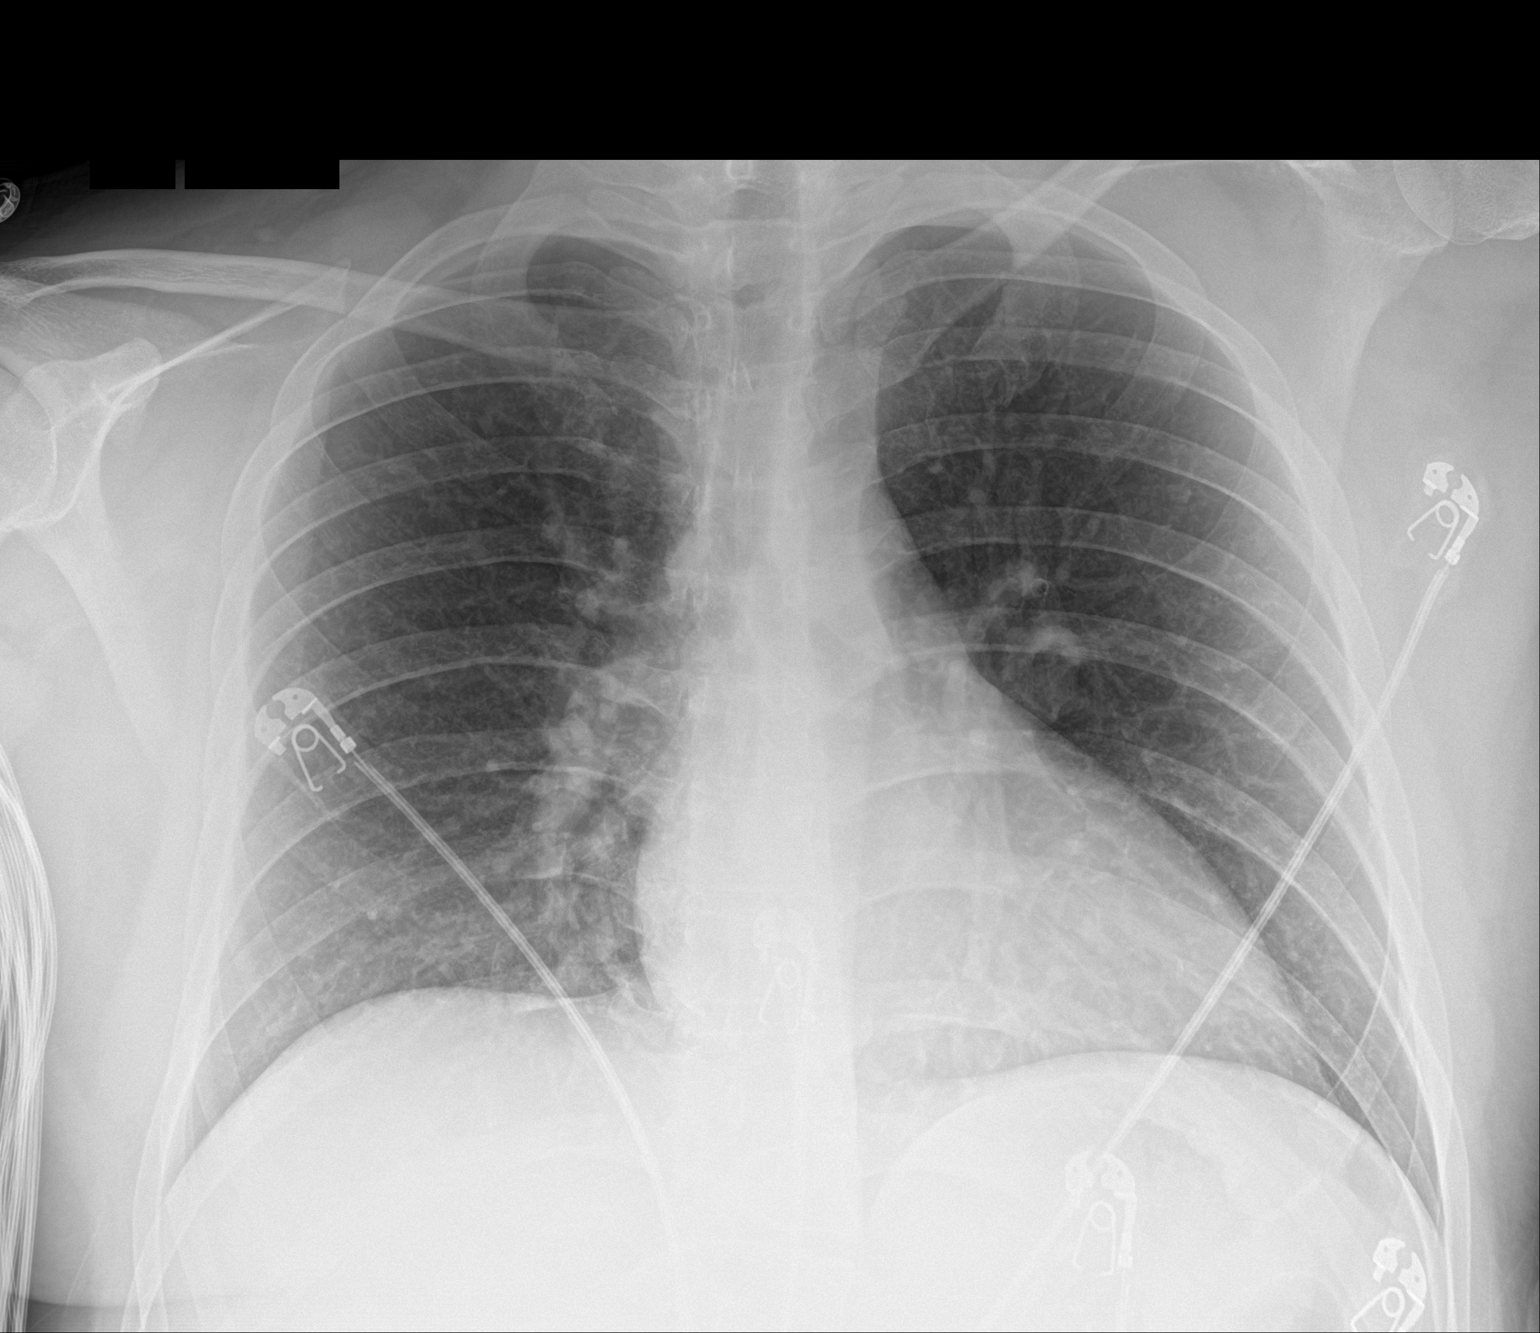

[1 of 1 positions shown; findings below may reference images not displayed]

FINDINGS: The heart size and mediastinal contours are within normal limits.
Both lungs are clear. The visualized skeletal structures are
unremarkable.
IMPRESSION: No active disease.

## 2018-10-14 IMAGING — RF DG CHOLANGIOGRAM OPERATIVE
1 series · 4 of 4 positions shown · non-contrast
Comparison: None.

CLINICAL DATA: Gallstone

EXAM:
INTRAOPERATIVE CHOLANGIOGRAM
TECHNIQUE: Cholangiographic images from the C-arm fluoroscopic device were
submitted for interpretation post-operatively. Please see the
procedural report for the amount of contrast and the fluoroscopy
time utilized.

[Series 1: run · 4 of 30 frames shown]
[frame 5/30]
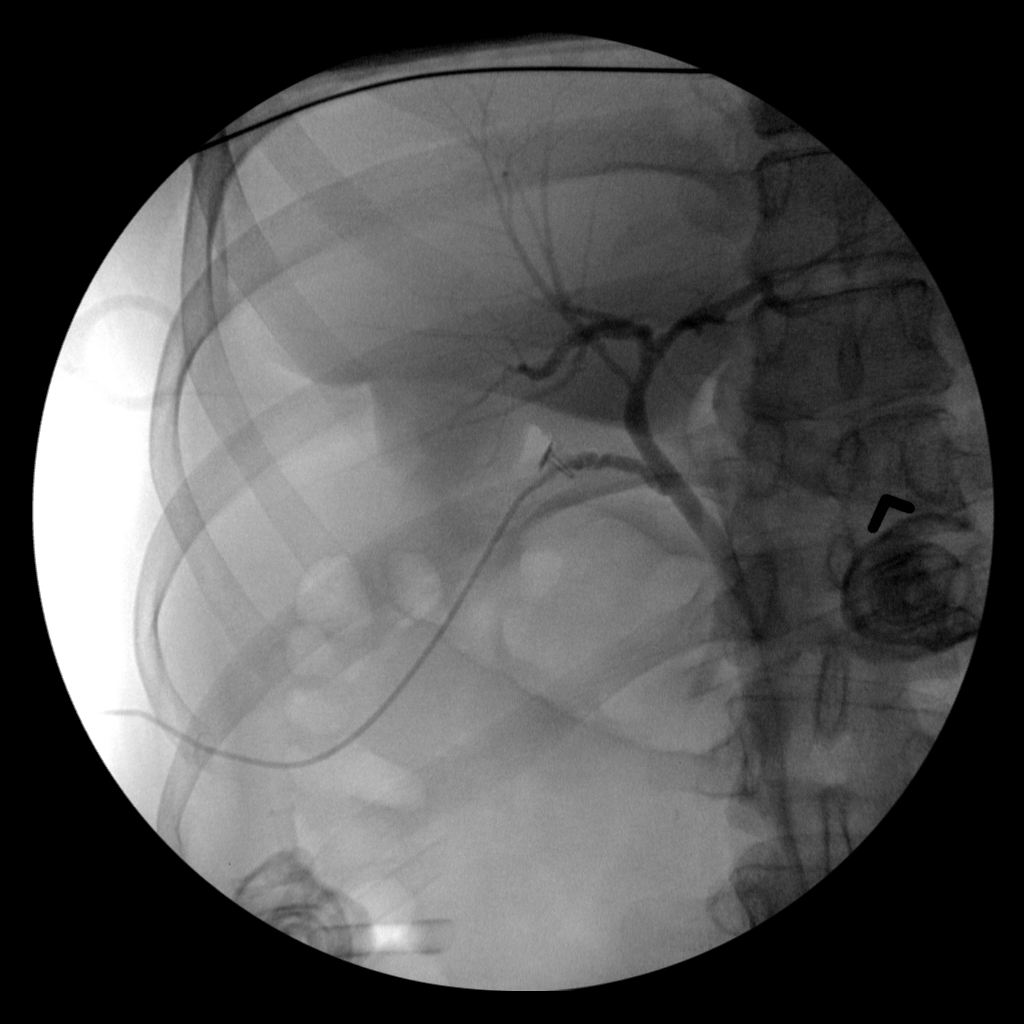
[frame 7/30]
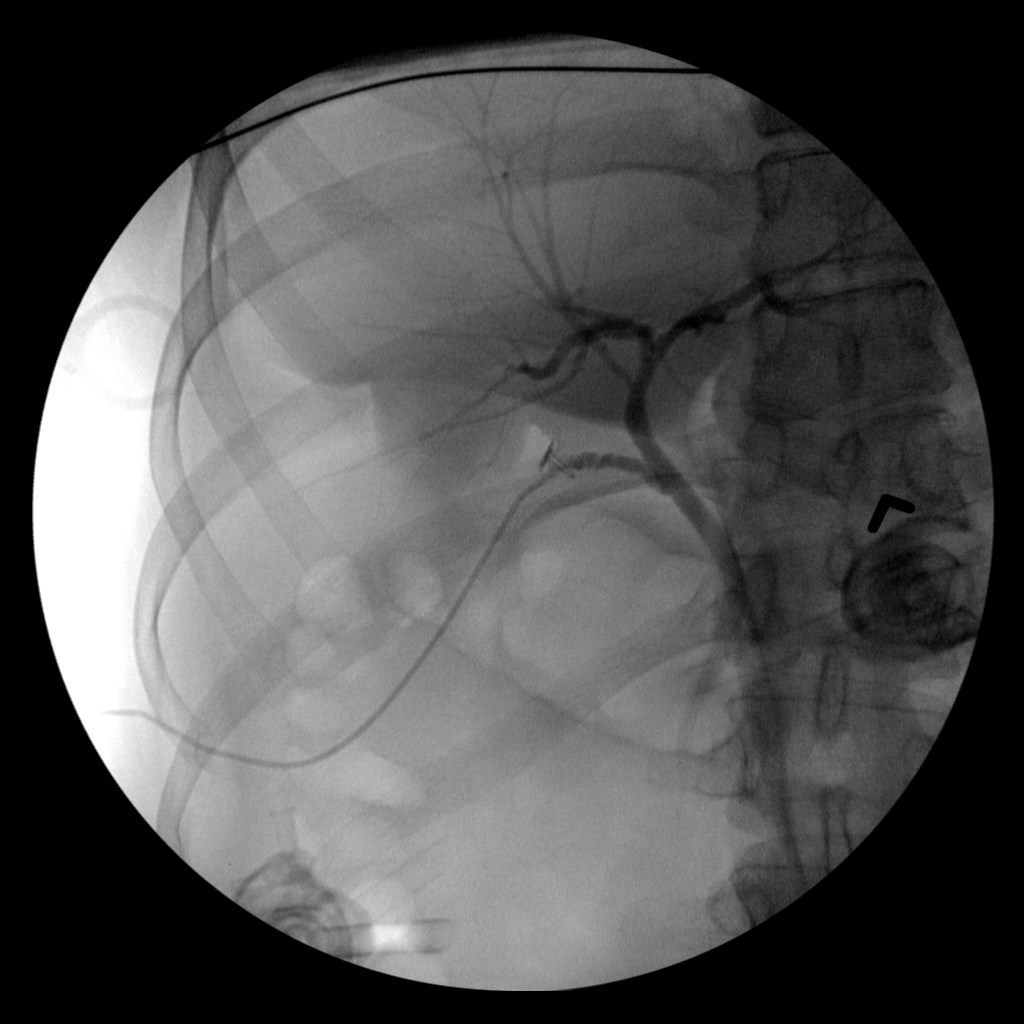
[frame 16/30]
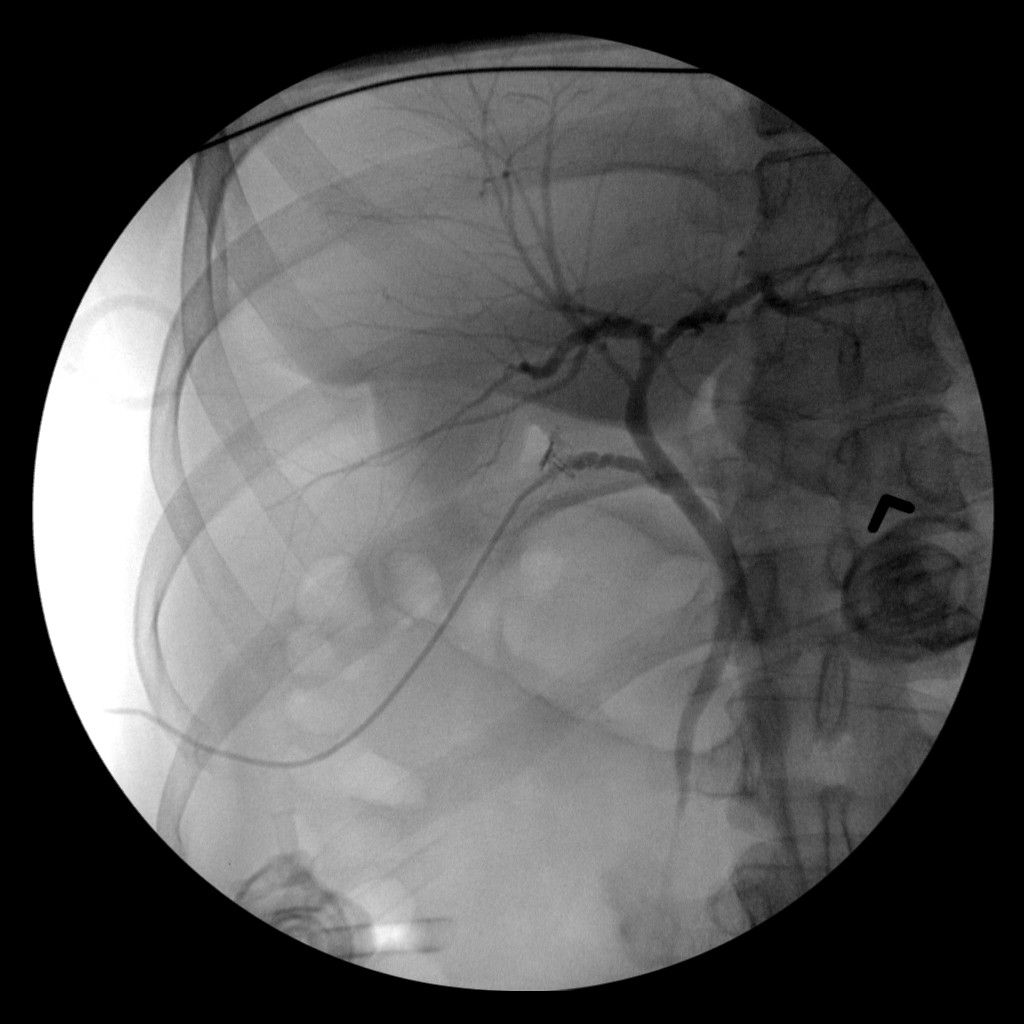
[frame 26/30]
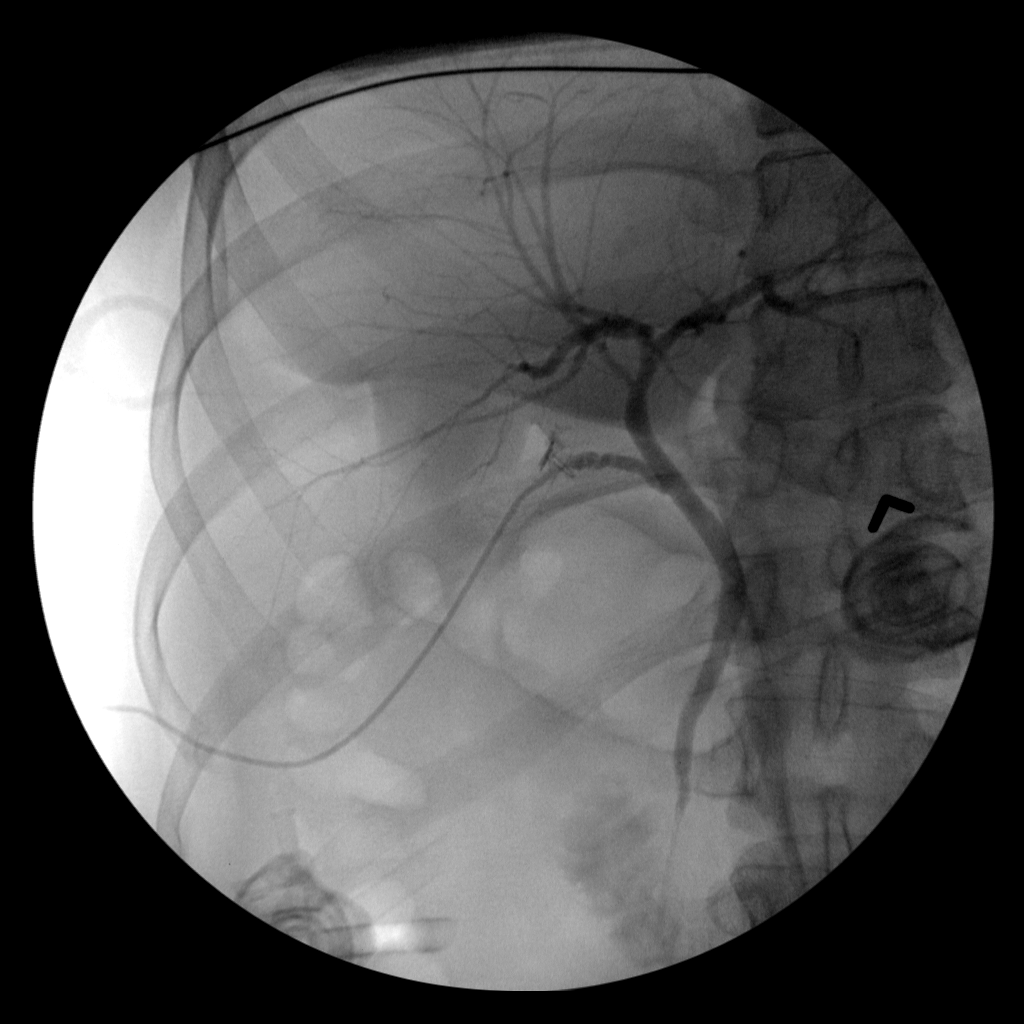

[4 of 4 positions shown; findings below may reference images not displayed]

FINDINGS: Contrast fills the biliary tree and duodenum without filling defects
in the common bile duct.
IMPRESSION: Patent biliary tree.

## 2019-04-11 ENCOUNTER — Encounter: Payer: Self-pay | Admitting: Physician Assistant
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
## 2022-03-28 ENCOUNTER — Other Ambulatory Visit: Payer: Self-pay

## 2022-03-28 ENCOUNTER — Emergency Department (HOSPITAL_COMMUNITY): Payer: Self-pay

## 2022-03-28 ENCOUNTER — Inpatient Hospital Stay (HOSPITAL_COMMUNITY)
Admission: EM | Admit: 2022-03-28 | Discharge: 2022-04-01 | DRG: 303 | Disposition: A | Discharge status: Home / Self Care | Payer: Self-pay | Attending: Cardiology | Admitting: Cardiology

## 2022-03-28 ENCOUNTER — Inpatient Hospital Stay (HOSPITAL_COMMUNITY): Payer: Self-pay

## 2022-03-28 ENCOUNTER — Encounter (HOSPITAL_COMMUNITY): Payer: Self-pay | Admitting: Emergency Medicine

## 2022-03-28 DIAGNOSIS — G932 Benign intracranial hypertension: Secondary | ICD-10-CM | POA: Diagnosis present

## 2022-03-28 DIAGNOSIS — Z7982 Long term (current) use of aspirin: Secondary | ICD-10-CM

## 2022-03-28 DIAGNOSIS — K219 Gastro-esophageal reflux disease without esophagitis: Secondary | ICD-10-CM | POA: Diagnosis present

## 2022-03-28 DIAGNOSIS — Z79899 Other long term (current) drug therapy: Secondary | ICD-10-CM

## 2022-03-28 DIAGNOSIS — Z566 Other physical and mental strain related to work: Secondary | ICD-10-CM

## 2022-03-28 DIAGNOSIS — D6852 Prothrombin gene mutation: Secondary | ICD-10-CM | POA: Diagnosis present

## 2022-03-28 DIAGNOSIS — Z87891 Personal history of nicotine dependence: Secondary | ICD-10-CM

## 2022-03-28 DIAGNOSIS — I513 Intracardiac thrombosis, not elsewhere classified: Principal | ICD-10-CM | POA: Diagnosis present

## 2022-03-28 DIAGNOSIS — R000 Tachycardia, unspecified: Secondary | ICD-10-CM | POA: Insufficient documentation

## 2022-03-28 DIAGNOSIS — R932 Abnormal findings on diagnostic imaging of liver and biliary tract: Secondary | ICD-10-CM | POA: Insufficient documentation

## 2022-03-28 DIAGNOSIS — D72829 Elevated white blood cell count, unspecified: Secondary | ICD-10-CM | POA: Insufficient documentation

## 2022-03-28 DIAGNOSIS — L659 Nonscarring hair loss, unspecified: Secondary | ICD-10-CM | POA: Insufficient documentation

## 2022-03-28 DIAGNOSIS — R002 Palpitations: Principal | ICD-10-CM | POA: Diagnosis present

## 2022-03-28 DIAGNOSIS — Z9049 Acquired absence of other specified parts of digestive tract: Secondary | ICD-10-CM

## 2022-03-28 DIAGNOSIS — I255 Ischemic cardiomyopathy: Secondary | ICD-10-CM

## 2022-03-28 DIAGNOSIS — I2510 Atherosclerotic heart disease of native coronary artery without angina pectoris: Secondary | ICD-10-CM

## 2022-03-28 DIAGNOSIS — I110 Hypertensive heart disease with heart failure: Secondary | ICD-10-CM | POA: Diagnosis present

## 2022-03-28 DIAGNOSIS — Z8249 Family history of ischemic heart disease and other diseases of the circulatory system: Secondary | ICD-10-CM

## 2022-03-28 DIAGNOSIS — Z955 Presence of coronary angioplasty implant and graft: Secondary | ICD-10-CM

## 2022-03-28 DIAGNOSIS — E876 Hypokalemia: Secondary | ICD-10-CM | POA: Diagnosis present

## 2022-03-28 DIAGNOSIS — I252 Old myocardial infarction: Secondary | ICD-10-CM

## 2022-03-28 DIAGNOSIS — Z7902 Long term (current) use of antithrombotics/antiplatelets: Secondary | ICD-10-CM

## 2022-03-28 DIAGNOSIS — J45909 Unspecified asthma, uncomplicated: Secondary | ICD-10-CM | POA: Diagnosis present

## 2022-03-28 DIAGNOSIS — I5022 Chronic systolic (congestive) heart failure: Secondary | ICD-10-CM | POA: Insufficient documentation

## 2022-03-28 DIAGNOSIS — I959 Hypotension, unspecified: Secondary | ICD-10-CM | POA: Diagnosis not present

## 2022-03-28 DIAGNOSIS — K7460 Unspecified cirrhosis of liver: Secondary | ICD-10-CM | POA: Diagnosis present

## 2022-03-28 DIAGNOSIS — F419 Anxiety disorder, unspecified: Secondary | ICD-10-CM | POA: Diagnosis present

## 2022-03-28 DIAGNOSIS — D582 Other hemoglobinopathies: Secondary | ICD-10-CM | POA: Insufficient documentation

## 2022-03-28 DIAGNOSIS — E785 Hyperlipidemia, unspecified: Secondary | ICD-10-CM | POA: Insufficient documentation

## 2022-03-28 DIAGNOSIS — T783XXA Angioneurotic edema, initial encounter: Secondary | ICD-10-CM | POA: Insufficient documentation

## 2022-03-28 DIAGNOSIS — Z91030 Bee allergy status: Secondary | ICD-10-CM

## 2022-03-28 LAB — PROTIME-INR
INR: 1 (ref 0.8–1.2)
Prothrombin Time: 13.1 seconds (ref 11.4–15.2)

## 2022-03-28 LAB — RAPID URINE DRUG SCREEN, HOSP PERFORMED
Amphetamines: NOT DETECTED
Barbiturates: NOT DETECTED
Benzodiazepines: NOT DETECTED
Cocaine: NOT DETECTED
Opiates: NOT DETECTED
Tetrahydrocannabinol: POSITIVE — AB

## 2022-03-28 LAB — ECHOCARDIOGRAM COMPLETE
Area-P 1/2: 3.56 cm2
Calc EF: 43.2 %
Height: 67 in
S' Lateral: 3.7 cm
Single Plane A2C EF: 44 %
Single Plane A4C EF: 44.6 %
Weight: 2752 oz

## 2022-03-28 LAB — TROPONIN I (HIGH SENSITIVITY)
Troponin I (High Sensitivity): 9 ng/L (ref <18)
Troponin I (High Sensitivity): 10 ng/L (ref <18)

## 2022-03-28 LAB — CBC
HCT: 49.2 % (ref 39.0–52.0)
Hemoglobin: 16.5 g/dL (ref 13.0–17.0)
MCH: 29.3 pg (ref 26.0–34.0)
MCHC: 33.5 g/dL (ref 30.0–36.0)
MCV: 87.2 fL (ref 80.0–100.0)
Platelets: 333 10*3/uL (ref 150–400)
RBC: 5.64 MIL/uL (ref 4.22–5.81)
RDW: 12.9 % (ref 11.5–15.5)
WBC: 10.2 10*3/uL (ref 4.0–10.5)
nRBC: 0 % (ref 0.0–0.2)

## 2022-03-28 LAB — BASIC METABOLIC PANEL
Anion gap: 14 (ref 5–15)
BUN: 10 mg/dL (ref 6–20)
CO2: 23 mmol/L (ref 22–32)
Calcium: 9.3 mg/dL (ref 8.9–10.3)
Chloride: 99 mmol/L (ref 98–111)
Creatinine, Ser: 0.99 mg/dL (ref 0.61–1.24)
GFR, Estimated: >60 mL/min (ref >60)
Glucose, Bld: 160 mg/dL — ABNORMAL HIGH (ref 70–99)
Potassium: 3.4 mmol/L — ABNORMAL LOW (ref 3.5–5.1)
Sodium: 136 mmol/L (ref 135–145)

## 2022-03-28 LAB — APTT: aPTT: 24 seconds (ref 24–36)

## 2022-03-28 LAB — HEPARIN LEVEL (UNFRACTIONATED)
Heparin Unfractionated: 0.46 IU/mL (ref 0.30–0.70)
Heparin Unfractionated: 0.48 IU/mL (ref 0.30–0.70)

## 2022-03-28 LAB — ETHANOL: Alcohol, Ethyl (B): <10 mg/dL (ref <10)

## 2022-03-28 LAB — HIV ANTIBODY (ROUTINE TESTING W REFLEX): HIV Screen 4th Generation wRfx: NONREACTIVE

## 2022-03-28 LAB — BRAIN NATRIURETIC PEPTIDE: B Natriuretic Peptide: 12.5 pg/mL (ref 0.0–100.0)

## 2022-03-28 LAB — D-DIMER, QUANTITATIVE: D-Dimer, Quant: 0.54 ug/mL-FEU — ABNORMAL HIGH (ref 0.00–0.50)

## 2022-03-28 LAB — TSH: TSH: 1.624 u[IU]/mL (ref 0.350–4.500)

## 2022-03-28 MED ORDER — PERFLUTREN LIPID MICROSPHERE
1.0000 mL | INTRAVENOUS | Status: AC | PRN
  Administered 2022-03-28: 5 mL via INTRAVENOUS

## 2022-03-28 MED ORDER — HEPARIN BOLUS VIA INFUSION
5000.0000 [IU] | Freq: Once | INTRAVENOUS | Status: AC
  Administered 2022-03-28: 5000 [IU] via INTRAVENOUS
  Filled 2022-03-28: qty 5000

## 2022-03-28 MED ORDER — ONDANSETRON HCL 4 MG/2ML IJ SOLN
4.0000 mg | Freq: Four times a day (QID) | INTRAMUSCULAR | Status: DC | PRN
  Administered 2022-03-31: 4 mg via INTRAVENOUS
  Filled 2022-03-28: qty 2

## 2022-03-28 MED ORDER — LORAZEPAM 1 MG PO TABS
0.5000 mg | ORAL_TABLET | Freq: Once | ORAL | Status: AC
  Administered 2022-03-28: 0.5 mg via ORAL
  Filled 2022-03-28: qty 1

## 2022-03-28 MED ORDER — ATORVASTATIN CALCIUM 80 MG PO TABS
80.0000 mg | ORAL_TABLET | Freq: Every day | ORAL | Status: DC
  Administered 2022-03-28 – 2022-04-01 (×5): 80 mg via ORAL
  Filled 2022-03-28: qty 2
  Filled 2022-03-28 (×4): qty 1

## 2022-03-28 MED ORDER — WARFARIN SODIUM 5 MG PO TABS
10.0000 mg | ORAL_TABLET | Freq: Once | ORAL | Status: AC
  Administered 2022-03-28: 10 mg via ORAL
  Filled 2022-03-28: qty 2

## 2022-03-28 MED ORDER — LACTATED RINGERS IV BOLUS
500.0000 mL | Freq: Once | INTRAVENOUS | Status: AC
  Administered 2022-03-28: 500 mL via INTRAVENOUS

## 2022-03-28 MED ORDER — WARFARIN - PHARMACIST DOSING INPATIENT: Freq: Every day | Status: DC

## 2022-03-28 MED ORDER — IOHEXOL 350 MG/ML SOLN
75.0000 mL | Freq: Once | INTRAVENOUS | Status: AC | PRN
  Administered 2022-03-28: 75 mL via INTRAVENOUS

## 2022-03-28 MED ORDER — ASPIRIN 81 MG PO TBEC: 81.0000 mg | DELAYED_RELEASE_TABLET | Freq: Every day | ORAL | Status: DC

## 2022-03-28 MED ORDER — ACETAMINOPHEN 325 MG PO TABS
650.0000 mg | ORAL_TABLET | ORAL | Status: DC | PRN
  Administered 2022-03-29: 650 mg via ORAL
  Filled 2022-03-28: qty 2

## 2022-03-28 MED ORDER — HEPARIN (PORCINE) 25000 UT/250ML-% IV SOLN
1250.0000 [IU]/h | INTRAVENOUS | Status: DC
  Administered 2022-03-28 – 2022-03-30 (×4): 1250 [IU]/h via INTRAVENOUS
  Filled 2022-03-28 (×4): qty 250

## 2022-03-28 MED ORDER — ASPIRIN 81 MG PO TBEC
81.0000 mg | DELAYED_RELEASE_TABLET | Freq: Every day | ORAL | Status: DC
  Administered 2022-03-28 – 2022-04-01 (×5): 81 mg via ORAL
  Filled 2022-03-28 (×5): qty 1

## 2022-03-28 NOTE — Progress Notes (Signed)
ANTICOAGULATION CONSULT NOTE - Initial Consult  Pharmacy Consult for Heparin  Indication:  Concern for LV thrombus on CT Angio  Allergies  Allergen Reactions   Bee Pollen Hives, Itching and Rash    Patient Measurements: Height: 5\' 7"  (170.2 cm) Weight: 78 kg (172 lb) IBW/kg (Calculated) : 66.1  Vital Signs: Temp: 98.7 F (37.1 C) (03/23 0152) Temp Source: Oral (03/23 0152) BP: 133/82 (03/23 0430) Pulse Rate: 82 (03/23 0430)  Labs: Recent Labs    03/28/22 0210 03/28/22 0355  HGB 16.5  --   HCT 49.2  --   PLT 333  --   CREATININE 0.99  --   TROPONINIHS 9 10    Estimated Creatinine Clearance: 100.2 mL/min (by C-G formula based on SCr of 0.99 mg/dL).   Medical History: Past Medical History:  Diagnosis Date   ADD (attention deficit disorder)    Anxiety    Asthma    as a child   CAD (coronary artery disease)    a. 04/28/14: acute anterolateral STEMI s/p DES to LAD   Gallstones    GERD (gastroesophageal reflux disease)    Gout    Hyperglycemia    a. 04/2014: Hg A1c 5.3   Hypertension    Ischemic cardiomyopathy    a. 04/29/13: 2D ECHO: LVEF 35-40%   Low serum HDL    MI (myocardial infarction) ([place])    Wears glasses    astigmatism     Assessment: 32 y/o M presented to the ED with palpitations/anxiety. CT angio with concern for LV thrombus. Starting heparin. PTA meds reviewed. Above labs reviewed.   Goal of Therapy:  Heparin level 0.3-0.7 units/ml Monitor platelets by anticoagulation protocol: Yes   Plan:  Heparin 5000 units BOLUS Start heparin drip at 1250 units/hr Heparin level in 6-8 hours Daily CBC/Heparin level Monitor for bleeding  Narda Bonds, PharmD, BCPS Clinical Pharmacist Phone: [phone]

## 2022-03-28 NOTE — Progress Notes (Signed)
ANTICOAGULATION CONSULT NOTE - Initial Consult  Pharmacy Consult for Heparin + Warfarin Indication:  Concern for LV thrombus on CT Angio  Allergies  Allergen Reactions   Bee Pollen Hives, Itching and Rash    Patient Measurements: Height: 5\' 7"  (170.2 cm) Weight: 78 kg (172 lb) IBW/kg (Calculated) : 66.1  Vital Signs: Temp: 98.7 F (37.1 C) (03/23 0152) Temp Source: Oral (03/23 0152) BP: 130/62 (03/23 0640) Pulse Rate: 85 (03/23 0640)  Labs: Recent Labs    03/28/22 0210 03/28/22 0355  HGB 16.5  --   HCT 49.2  --   PLT 333  --   CREATININE 0.99  --   TROPONINIHS 9 10    Estimated Creatinine Clearance: 100.2 mL/min (by C-G formula based on SCr of 0.99 mg/dL).   Medical History: Past Medical History:  Diagnosis Date   ADD (attention deficit disorder)    Anxiety    Asthma    as a child   CAD (coronary artery disease)    a. 04/28/14: acute anterolateral STEMI s/p DES to LAD   Gallstones    GERD (gastroesophageal reflux disease)    Gout    Hyperglycemia    a. 04/2014: Hg A1c 5.3   Hypertension    Ischemic cardiomyopathy    a. 04/29/13: 2D ECHO: LVEF 35-40%   Low serum HDL    MI (myocardial infarction) ([place])    Wears glasses    astigmatism     Assessment: 32 y/o M presented to the ED with palpitations/anxiety. CT angio with concern for LV thrombus. Starting heparin. PTA meds reviewed. Above labs reviewed. Pharmacy  consulted to overlap with warfarin. Baseline INR 1.   Goal of Therapy:  INR 2-3 Heparin level 0.3-0.7 units/ml Monitor platelets by anticoagulation protocol: Yes   Plan:  Heparin 5000 units BOLUS Start heparin drip at 1250 units/hr Heparin level in 6-8 hours Daily CBC/Heparin level Warfarin 10mg  po x1 at 1600 PM (per dosing nomogram) Daily PT/INR Monitor for bleeding Continue Warfarin + Heparin overlap for at least 5 days AND INR >2 for 2 days  Sloan Leiter, PharmD, BCPS, BCCCP Clinical Pharmacist Please refer to [facility] for [place]  numbers 03/28/2022, 7:36 AM

## 2022-03-28 NOTE — ED Provider Notes (Signed)
[facility] Provider Note   CSN: [id number] Arrival date & time: 03/28/22  0142     History  Chief Complaint  Patient presents with   Hypertension   Palpitations    Chad Fitzgerald is a 32 y.o. male.  With PMH of CAD status post DES to LAD, HTN, GERD, anxiety, asthma who presents with palpitations and feeling unwell and reporting anxiety.  Patient has been having a lot of stress at work and he went out after work today and drank 5 drinks.  He denied any associated drug use.  He went home was sitting and watching TV and had a lot of racing thoughts about work and became very anxious and felt like his heart was beating out of his chest.  He had no associated chest pain or shortness of breath.  No syncopal episodes.  No focal weakness numbness or tingling.  He has not been taking any blood pressure or cholesterol medicines for a while because he never followed up with cardiology.  He gets nervous because he has cardiac history.  He did take one of his father's blood pressure medicines but is unsure what he took.  Since being in the ER he is calm down some and feels slightly better he feels like he may have had a panic attack.  He has never had a panic attack in the past. He stopped all of his meds "because he was doing better."   Hypertension  Palpitations      Home Medications Prior to Admission medications   Medication Sig Start Date End Date Taking? Authorizing Provider  finasteride (PROSCAR) 5 MG tablet Take 1.25 mg by mouth daily. 02/08/22  Yes [provider]  minoxidil (LONITEN) 2.5 MG tablet Take 2.5 mg by mouth daily. 03/15/22  Yes [provider]      Allergies    Bee pollen    Review of Systems   Review of Systems  Cardiovascular:  Positive for palpitations.    Physical Exam Updated Vital Signs BP 133/88 (BP Location: Right Arm)   Pulse 81   Temp 98.1 F (36.7 C) (Oral)   Resp 14   Ht 5\' 7"  (1.702  m)   Wt 78 kg   SpO2 95%   BMI 26.94 kg/m  Physical Exam Constitutional: Alert and oriented.  Slightly anxious but nontoxic, GCS 15 Eyes: Conjunctivae are normal. ENT      Head: Normocephalic and atraumatic.      Nose: No congestion.      Mouth/Throat: Mucous membranes are moist.      Neck: No stridor. Cardiovascular: S1, S2,  Normal and symmetric distal pulses are present in all extremities.Warm and well perfused. Respiratory: Normal respiratory effort. Breath sounds are normal.  O2 sat 95-97 on RA Gastrointestinal: Soft and nontender.  Musculoskeletal: Normal range of motion in all extremities.      Right lower leg: No tenderness or edema.      Left lower leg: No tenderness or edema. Neurologic: Normal speech and language.  CN II through XII grossly intact.  Normal finger-to-nose bilaterally.  5 out of 5 strength bilateral upper and lower extremities.  Sensation grossly intact.  No gross focal neurologic deficits are appreciated. Skin: Skin is warm, dry and intact. No rash noted. Psychiatric: Anxious  ED Results / Procedures / Treatments   Labs (all labs ordered are listed, but only abnormal results are displayed) Labs Reviewed  BASIC METABOLIC PANEL - Abnormal; Notable for the  following components:      Result Value   Potassium 3.4 (*)    Glucose, Bld 160 (*)    All other components within normal limits  D-DIMER, QUANTITATIVE - Abnormal; Notable for the following components:   D-Dimer, Quant 0.54 (*)    All other components within normal limits  RAPID URINE DRUG SCREEN, HOSP PERFORMED - Abnormal; Notable for the following components:   Tetrahydrocannabinol POSITIVE (*)    All other components within normal limits  CBC  BRAIN NATRIURETIC PEPTIDE  ETHANOL  TSH  PROTIME-INR  APTT  HEPARIN LEVEL (UNFRACTIONATED)  HIV ANTIBODY (ROUTINE TESTING W REFLEX)  HEPARIN LEVEL (UNFRACTIONATED)  BASIC METABOLIC PANEL  LIPID PANEL  PROTIME-INR  CBC  HEPARIN LEVEL  (UNFRACTIONATED)  TROPONIN I (HIGH SENSITIVITY)  TROPONIN I (HIGH SENSITIVITY)    EKG EKG Interpretation  Date/Time:  Saturday March 28 2022 01:55:39 EDT Ventricular Rate:  113 PR Interval:  159 QRS Duration: 88 QT Interval:  322 QTC Calculation: 442 R Axis:   93 Text Interpretation: Sinus tachycardia Abnormal lateral Q waves Anterior infarct, old Since last tracing rate faster Confirmed by Georgina Snell [phone]) on 03/28/2022 2:03:59 AM  Radiology ECHOCARDIOGRAM COMPLETE  Result Date: 03/28/2022    ECHOCARDIOGRAM REPORT   Patient Name:   Chad Fitzgerald Date of Exam: 03/28/2022 Medical Rec #:  [id number]           Height:       67.0 in Accession #:    [id number]          Weight:       172.0 lb Date of Birth:  1990-03-11           BSA:          1.897 m Patient Age:    31 years            BP:           127/90 mmHg Patient Gender: M                   HR:           72 bpm. Exam Location:  Inpatient Procedure: 2D Echo, Color Doppler, Cardiac Doppler and Intracardiac            Opacification Agent STAT ECHO Indications:    I25.5 Ischemic cardiomyopathy  History:        Patient has prior history of Echocardiogram examinations.                 Cardiomyopathy, Previous Myocardial Infarction and CAD; Risk                 Factors:Hypertension.  Sonographer:    Phineas Douglas Referring Phys: [id number] ANGELA NICOLE DUKE IMPRESSIONS  1. Large, ovoid apical LV mural thrombus in region of akinesis involving the apical anteroseptal/inferoseptal walls. Measures 2.3 x 1.6 cm and not freely mobile. Visualized with and without Definity.  2. Left ventricular ejection fraction, by estimation, is 40 to 45%. The left ventricle has mildly decreased function. The left ventricle demonstrates regional wall motion abnormalities (see scoring diagram/findings for description) consistent with ischemic cardiomyopathy. Left ventricular diastolic parameters were normal.  3. Right ventricular systolic function is normal. The  right ventricular size is normal. Tricuspid regurgitation signal is inadequate for assessing PA pressure.  4. Left atrial size was mildly dilated.  5. The mitral valve is grossly normal. Trivial mitral valve regurgitation.  6. The aortic valve is tricuspid. Aortic valve regurgitation is not  visualized.  7. The inferior vena cava is normal in size with greater than 50% respiratory variability, suggesting right atrial pressure of 3 mmHg. Comparison(s): Prior images unable to be directly viewed. FINDINGS  Left Ventricle: Left ventricular ejection fraction, by estimation, is 40 to 45%. The left ventricle has mildly decreased function. The left ventricle demonstrates regional wall motion abnormalities. Definity contrast agent was given IV to delineate the left ventricular endocardial borders. The left ventricular internal cavity size was normal in size. There is borderline left ventricular hypertrophy. Left ventricular diastolic parameters were normal.  LV Wall Scoring: The mid and distal anterior septum, apical inferior segment, and apex are akinetic. The mid and distal anterior wall, basal anteroseptal segment, apical lateral segment, mid inferoseptal segment, and basal inferoseptal segment are hypokinetic. The antero-lateral wall, inferior wall, posterior wall, and basal anterior segment are normal. Right Ventricle: The right ventricular size is normal. No increase in right ventricular wall thickness. Right ventricular systolic function is normal. Tricuspid regurgitation signal is inadequate for assessing PA pressure. Left Atrium: Left atrial size was mildly dilated. Right Atrium: Right atrial size was normal in size. Pericardium: There is no evidence of pericardial effusion. Mitral Valve: The mitral valve is grossly normal. Trivial mitral valve regurgitation. Tricuspid Valve: The tricuspid valve is grossly normal. Tricuspid valve regurgitation is trivial. Aortic Valve: The aortic valve is tricuspid. Aortic valve  regurgitation is not visualized. Pulmonic Valve: The pulmonic valve was grossly normal. Pulmonic valve regurgitation is trivial. Aorta: The aortic root is normal in size and structure. Venous: The inferior vena cava is normal in size with greater than 50% respiratory variability, suggesting right atrial pressure of 3 mmHg. IAS/Shunts: No atrial level shunt detected by color flow Doppler.  LEFT VENTRICLE PLAX 2D LVIDd:         5.20 cm      Diastology LVIDs:         3.70 cm      LV e' medial:    8.16 cm/s LV PW:         1.00 cm      LV E/e' medial:  11.9 LV IVS:        0.90 cm      LV e' lateral:   8.92 cm/s LVOT diam:     1.80 cm      LV E/e' lateral: 10.9 LV SV:         54 LV SV Index:   28 LVOT Area:     2.54 cm  LV Volumes (MOD) LV vol d, MOD A2C: 141.0 ml LV vol d, MOD A4C: 169.0 ml LV vol s, MOD A2C: 78.9 ml LV vol s, MOD A4C: 93.7 ml LV SV MOD A2C:     62.1 ml LV SV MOD A4C:     169.0 ml LV SV MOD BP:      67.3 ml RIGHT VENTRICLE             IVC RV Basal diam:  3.80 cm     IVC diam: 1.80 cm RV S prime:     11.90 cm/s TAPSE (M-mode): 1.8 cm LEFT ATRIUM             Index        RIGHT ATRIUM           Index LA diam:        3.60 cm 1.90 cm/m   RA Area:     17.20 cm LA Vol (A2C):   47.2 ml 24.89  ml/m  RA Volume:   47.00 ml  24.78 ml/m LA Vol (A4C):   72.6 ml 38.28 ml/m LA Biplane Vol: 58.9 ml 31.05 ml/m  AORTIC VALVE LVOT Vmax:   91.70 cm/s LVOT Vmean:  67.800 cm/s LVOT VTI:    0.212 m  AORTA Ao Root diam: 2.70 cm Ao Asc diam:  2.50 cm MITRAL VALVE MV Area (PHT): 3.56 cm    SHUNTS MV Decel Time: 213 msec    Systemic VTI:  0.21 m MV E velocity: 96.80 cm/s  Systemic Diam: 1.80 cm MV A velocity: 54.70 cm/s MV E/A ratio:  1.77 Rozann Lesches MD Electronically signed by Rozann Lesches MD Signature Date/Time: 03/28/2022/2:02:48 PM    Final    CT Angio Chest PE W and/or Wo Contrast  Result Date: 03/28/2022 CLINICAL DATA:  Tachycardia with positive D-dimer.  Palpitations. EXAM: CT ANGIOGRAPHY CHEST WITH  CONTRAST TECHNIQUE: Multidetector CT imaging of the chest was performed using the standard protocol during bolus administration of intravenous contrast. Multiplanar CT image reconstructions and MIPs were obtained to evaluate the vascular anatomy. RADIATION DOSE REDUCTION: This exam was performed according to the departmental dose-optimization program which includes automated exposure control, adjustment of the mA and/or kV according to patient size and/or use of iterative reconstruction technique. CONTRAST:  54mL OMNIPAQUE IOHEXOL 350 MG/ML SOLN COMPARISON:  06/27/2016 FINDINGS: Cardiovascular: The heart size is normal. No substantial pericardial effusion. There is marked mild cardial thinning in the left ventricular apex with aneurysm/pseudoaneurysm evident. Filling defect in the apex of the left ventricle is associated with dystrophic calcification. Unclear whether the calcification is myocardial or associated with the filling defect itself. Findings concerning for intraventricular thrombus. There is no filling defect within the opacified pulmonary arteries to suggest the presence of an acute pulmonary embolus. Mediastinum/Nodes: No mediastinal lymphadenopathy. There is no hilar lymphadenopathy. The esophagus has normal imaging features. There is no axillary lymphadenopathy. Lungs/Pleura: Dependent atelectasis noted both lung bases. No pulmonary edema or pleural effusion. No suspicious pulmonary nodule or mass. Upper Abdomen: Subtle nodularity of liver contour raises the question of cirrhosis. Gallbladder surgically absent. Musculoskeletal: No worrisome lytic or sclerotic osseous abnormality. Segmentation anomaly noted at T11. Review of the MIP images confirms the above findings. IMPRESSION: 1. No CT evidence for acute pulmonary embolus. 2. Marked mild cardial thinning in the left ventricular apex with aneurysm/pseudoaneurysm evident. Filling defect in the left ventricle is associated with dystrophic  calcification. Unclear whether the calcification is in the mild cardia may or may not be associated with the filling defect itself. Findings concerning for intraventricular thrombus. Echocardiography would likely prove helpful to further evaluate. 3. Subtle nodularity of liver contour raises the question of cirrhosis. Critical Value/emergent results were called by telephone at the time of interpretation on 03/28/2022 at 6:02 am to provider Georgina Snell , who verbally acknowledged these results. Electronically Signed   By: Misty Stanley M.D.   On: 03/28/2022 06:02   CT Head Wo Contrast  Result Date: 03/28/2022 CLINICAL DATA:  32 year old male with hypertension, confusion, tachycardia. EXAM: CT HEAD WITHOUT CONTRAST TECHNIQUE: Contiguous axial images were obtained from the base of the skull through the vertex without intravenous contrast. RADIATION DOSE REDUCTION: This exam was performed according to the departmental dose-optimization program which includes automated exposure control, adjustment of the mA and/or kV according to patient size and/or use of iterative reconstruction technique. COMPARISON:  Head CT 07/10/2015. FINDINGS: Brain: Cerebral volume is stable, within normal limits. No midline shift, ventriculomegaly, mass effect, evidence of mass  lesion, intracranial hemorrhage or evidence of cortically based acute infarction. Gray-white matter differentiation is within normal limits throughout the brain. Chronic partially empty sella. Vascular:  No suspicious intracranial vascular hyperdensity. Skull: Stable, negative. Sinuses/Orbits: Visualized paranasal sinuses and mastoids are stable and well aerated. Other: Visualized orbits and scalp soft tissues are within normal limits. IMPRESSION: Stable noncontrast Head CT, normal aside from Chronic partially empty sella which is often a normal anatomic variant but can be associated with idiopathic intracranial hypertension (pseudotumor cerebri). Electronically  Signed   By: Genevie Ann M.D.   On: 03/28/2022 05:45   DG Chest Portable 1 View  Result Date: 03/28/2022 CLINICAL DATA:  Palpitations.  Left chest discomfort. EXAM: PORTABLE CHEST 1 VIEW COMPARISON:  07/13/2017 FINDINGS: Shallow inspiration. Heart size and pulmonary vascularity are normal for technique. Suggestion of infiltration or atelectasis in the left lung base. No pleural effusions. No pneumothorax. Mediastinal contours appear intact. Surgical clips in the right upper quadrant. IMPRESSION: Mild infiltration or atelectasis in the left lung base. Electronically Signed   By: Lucienne Capers M.D.   On: 03/28/2022 02:59    Procedures .Critical Care  Performed by: Elgie Congo, MD Authorized by: Elgie Congo, MD   Critical care provider statement:    Critical care time (minutes):  35   Critical care was necessary to treat or prevent imminent or life-threatening deterioration of the following conditions:  Cardiac failure   Critical care was time spent personally by me on the following activities:  Development of treatment plan with patient or surrogate, discussions with consultants, evaluation of patient's response to treatment, examination of patient, ordering and review of laboratory studies, ordering and review of radiographic studies, ordering and performing treatments and interventions, pulse oximetry, re-evaluation of patient's condition, review of old charts and obtaining history from patient or surrogate   Care discussed with: admitting provider       Medications Ordered in ED Medications  heparin ADULT infusion 100 units/mL (25000 units/228mL) (1,250 Units/hr Intravenous New Bag/Given 03/28/22 0656)  acetaminophen (TYLENOL) tablet 650 mg (has no administration in time range)  ondansetron (ZOFRAN) injection 4 mg (has no administration in time range)  Warfarin - Pharmacist Dosing Inpatient ( Does not apply Duplicate [id number])  atorvastatin (LIPITOR) tablet 80 mg (80 mg  Oral Given 03/28/22 1451)  aspirin EC tablet 81 mg (81 mg Oral Given 03/28/22 1451)  perflutren lipid microspheres (DEFINITY) IV suspension (5 mLs Intravenous Given 03/28/22 1332)  lactated ringers bolus 500 mL (0 mLs Intravenous Stopped 03/28/22 0458)  LORazepam (ATIVAN) tablet 0.5 mg (0.5 mg Oral Given 03/28/22 0255)  iohexol (OMNIPAQUE) 350 MG/ML injection 75 mL (75 mLs Intravenous Contrast Given 03/28/22 0533)  heparin bolus via infusion 5,000 Units (5,000 Units Intravenous Bolus from Bag 03/28/22 0659)  warfarin (COUMADIN) tablet 10 mg (10 mg Oral Given 03/28/22 1614)    ED Course/ Medical Decision Making/ A&P Clinical Course as of 03/28/22 1703  Sat Mar 28, 2022  0609 CTA PE study personally reviewed by me and results by radiology concerning for large LV thrombus.  Spoke with on-call cardiology fellow Dr Grayce Sessions who will evaluate patient. Recommending formal Echo of heart. [VB]  [id number] Bedside ultrasound performed by cardiology fellow confirming large LV thrombus.  Patient to be started on heparin drip and admitted to cardiology for further management. [VB]    Clinical Course User Index [VB] Elgie Congo, MD  Medical Decision Making Chad Fitzgerald is a 32 y.o. male.  With PMH of CAD status post DES to LAD, HTN, GERD, anxiety, asthma who presents with palpitations and feeling unwell and reporting anxiety.   Previous meds:ASA 81 mg, Lipitor 80 mg, lisinopril 5 mg BID, spironolactone 12.5 mg, Brilinta 90 mg BID, Nitro SL 0.4mg  q 35min prn CP, Carvedilol 3.125mg  BID  Patient has been off all of his meds as listed above for prolonged period of time.  Due to his palpitations, he is tachycardic on presentation and slightly hypertensive although otherwise hemodynamically stable and do suspect component of anxiety however consider acute electrolyte abnormality, arrhythmia, PE, thyroid abnormality among multiple other etiologies of symptoms.  Initial labs remarkable  for elevated D-dimer 0.54 as well as mild hypokalemia 3.4.  Followed up with CTA PE study which I personally reviewed concerning for large LV thrombus.  Patient was started on a heparin drip.  He had no stroke symptoms and CT head obtained which showed no ICH or acute findings other than IIH however patient asymptomatic.  Bedside ultrasound performed by cardiology fellow confirming large LV thrombus.  Patient to be started on heparin drip and admitted to cardiology for further management.   Amount and/or Complexity of Data Reviewed Labs: ordered. Radiology: ordered.  Risk Prescription drug management. Decision regarding hospitalization.    Final Clinical Impression(s) / ED Diagnoses Final diagnoses:  Heart palpitations  LV (left ventricular) mural thrombus    Rx / DC Orders ED Discharge Orders     None         Elgie Congo, MD 03/28/22 [phone]

## 2022-03-28 NOTE — Progress Notes (Signed)
ANTICOAGULATION CONSULT NOTE - Initial Consult  Pharmacy Consult for Heparin + Warfarin Indication:  Concern for LV thrombus on CT Angio  Allergies  Allergen Reactions   Bee Pollen Hives, Itching and Rash    Patient Measurements: Height: 5\' 7"  (170.2 cm) Weight: 78 kg (172 lb) IBW/kg (Calculated) : 66.1  Vital Signs: Temp: 98.1 F (36.7 C) (03/23 1304) Temp Source: Oral (03/23 1304) BP: 133/88 (03/23 1304) Pulse Rate: 81 (03/23 1020)  Labs: Recent Labs    03/28/22 0210 03/28/22 0355 03/28/22 0650 03/28/22 1454  HGB 16.5  --   --   --   HCT 49.2  --   --   --   PLT 333  --   --   --   APTT  --   --  24  --   LABPROT  --   --  13.1  --   INR  --   --  1.0  --   HEPARINUNFRC  --   --   --  0.46  CREATININE 0.99  --   --   --   TROPONINIHS 9 10  --   --      Estimated Creatinine Clearance: 100.2 mL/min (by C-G formula based on SCr of 0.99 mg/dL).   Medical History: Past Medical History:  Diagnosis Date   ADD (attention deficit disorder)    Anxiety    Asthma    as a child   CAD (coronary artery disease)    a. 04/28/14: acute anterolateral STEMI s/p DES to LAD   Gallstones    GERD (gastroesophageal reflux disease)    Gout    Hyperglycemia    a. 04/2014: Hg A1c 5.3   Hypertension    Ischemic cardiomyopathy    a. 04/29/13: 2D ECHO: LVEF 35-40%   Low serum HDL    MI (myocardial infarction) ([place])    Wears glasses    astigmatism     Assessment: 32 y/o M presented to the ED with palpitations/anxiety. CT angio with concern for LV thrombus. Starting heparin. PTA meds reviewed. Above labs reviewed. Pharmacy consulted to overlap with warfarin. Baseline INR 1.   Heparin level therapeutic at 0.46. Hgb 16.5, PLT 333. No issues with heparin infusion or s/sx bleeding noted.   Goal of Therapy:  INR 2-3 Heparin level 0.3-0.7 units/ml Monitor platelets by anticoagulation protocol: Yes   Plan:  Continue heparin at 1250 units/hr 6 hour confirmatory heparin level   Daily CBC/Heparin level Warfarin 10mg  po x1 at 1600 PM (per dosing nomogram) Daily PT/INR Monitor for bleeding Continue Warfarin + Heparin overlap for at least 5 days AND INR >2 for 2 days  Eliseo Gum, PharmD PGY1 Pharmacy Resident   03/28/2022  3:33 PM
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
## 2022-03-28 NOTE — ED Notes (Signed)
ED TO INPATIENT HANDOFF REPORT  ED Nurse Name and Phone #: [phone]  S Name/Age/Gender Chad Fitzgerald 32 y.o. male Room/Bed: 024C/024C  Code Status   Code Status: Full Code  Home/SNF/Other Home Patient oriented to: self, place, time, and situation Is this baseline? Yes   Triage Complete: Triage complete  Chief Complaint LV (left ventricular) mural thrombus [I51.3]  Triage Note Pt reports feeling very "poorly."  States his blood pressure is high and "it feels like my heart is beating out of my chest."  Pt states he did drink some ETOH.  Pt states he had a "stressful day."  Pt took 162 ASA PTA   Allergies Allergies  Allergen Reactions   Bee Pollen Hives, Itching and Rash    Level of Care/Admitting Diagnosis ED Disposition     ED Disposition  Admit   Condition  --   [place]: [place] [[id number]]  Level of Care: Progressive [102]  Admit to Progressive based on following criteria: CARDIOVASCULAR & THORACIC of moderate stability with acute coronary syndrome symptoms/low risk myocardial infarction/hypertensive urgency/arrhythmias/heart failure potentially compromising stability and stable post cardiovascular intervention patients.  May admit patient to [facility] or [facility] if equivalent level of care is available:: Yes  Covid Evaluation: Asymptomatic - no recent exposure (last 10 days) testing not required  Diagnosis: LV (left ventricular) mural thrombus [333455]  Admitting Physician: Delorse Limber [id number]  Attending Physician: Delorse Limber [id number]  Certification:: I certify this patient will need inpatient services for at least 2 midnights  Estimated Length of Stay: 5          B Medical/Surgery History Past Medical History:  Diagnosis Date   ADD (attention deficit disorder)    Anxiety    Asthma    as a child   CAD (coronary artery disease)    a. 04/28/14: acute anterolateral STEMI s/p DES to LAD    Gallstones    GERD (gastroesophageal reflux disease)    Gout    Hyperglycemia    a. 04/2014: Hg A1c 5.3   Hypertension    Ischemic cardiomyopathy    a. 04/29/13: 2D ECHO: LVEF 35-40%   Low serum HDL    MI (myocardial infarction) ([place])    Wears glasses    astigmatism   Past Surgical History:  Procedure Laterality Date   APPENDECTOMY     CARDIAC CATHETERIZATION N/A 12/24/2014   Procedure: Left Heart Cath and Coronary Angiography;  Surgeon: Jolaine Artist, MD;  Location: [facility] CV LAB;  Service: Cardiovascular;  Laterality: N/A;   CHOLECYSTECTOMY N/A 10/09/2016   Procedure: LAPAROSCOPIC CHOLECYSTECTOMY WITH INTRAOPERATIVE CHOLANGIOGRAM;  Surgeon: Greer Pickerel, MD;  Location: [facility];  Service: General;  Laterality: N/A;   LEFT HEART CATH N/A 04/28/2014   Procedure: LEFT HEART CATH;  Surgeon: Lorretta Harp, MD;  Location: [facility] CATH LAB;  Service: Cardiovascular;  Laterality: N/A;     A IV Location/Drains/Wounds Patient Lines/Drains/Airways Status     Active Line/Drains/Airways     Name Placement date Placement time Site Days   Peripheral IV 03/28/22 20 G Anterior;Left;Upper Arm 03/28/22  0157  Arm  less than 1   Peripheral IV 03/28/22 20 G Anterior;Right Wrist 03/28/22  0651  Wrist  less than 1   Post Cath / Sheath 12/24/14 Right Arterial;Femoral 12/24/14  1258  Arterial;Femoral  2651   Incision (Closed) 10/09/16 Abdomen 10/09/16  1648  -- 1996   Incision - 3 Ports Abdomen Left;Upper Upper;Right Umbilicus  10/09/16  1639  -- 1996            Intake/Output Last 24 hours  Intake/Output Summary (Last 24 hours) at 03/28/2022 [id number] Last data filed at 03/28/2022 0458 Gross per 24 hour  Intake 500 ml  Output --  Net 500 ml    Labs/Imaging Results for orders placed or performed during the hospital encounter of 03/28/22 (from the past 48 hour(s))  Basic metabolic panel     Status: Abnormal   Collection Time: 03/28/22  2:10 AM  Result Value Ref Range   Sodium 136 135 - 145  mmol/L   Potassium 3.4 (L) 3.5 - 5.1 mmol/L   Chloride 99 98 - 111 mmol/L   CO2 23 22 - 32 mmol/L   Glucose, Bld 160 (H) 70 - 99 mg/dL    Comment: Glucose reference range applies only to samples taken after fasting for at least 8 hours.   BUN 10 6 - 20 mg/dL   Creatinine, Ser 0.99 0.61 - 1.24 mg/dL   Calcium 9.3 8.9 - 10.3 mg/dL   GFR, Estimated >60 >60 mL/min    Comment: (NOTE) Calculated using the CKD-EPI Creatinine Equation (2021)    Anion gap 14 5 - 15    Comment: Performed at [facility] [address], [place] [address]  CBC     Status: None   Collection Time: 03/28/22  2:10 AM  Result Value Ref Range   WBC 10.2 4.0 - 10.5 K/uL   RBC 5.64 4.22 - 5.81 MIL/uL   Hemoglobin 16.5 13.0 - 17.0 g/dL   HCT 49.2 39.0 - 52.0 %   MCV 87.2 80.0 - 100.0 fL   MCH 29.3 26.0 - 34.0 pg   MCHC 33.5 30.0 - 36.0 g/dL   RDW 12.9 11.5 - 15.5 %   Platelets 333 150 - 400 K/uL   nRBC 0.0 0.0 - 0.2 %    Comment: Performed at [facility], [place] [address], [place], [place] [address]  Troponin I (High Sensitivity)     Status: None   Collection Time: 03/28/22  2:10 AM  Result Value Ref Range   Troponin I (High Sensitivity) 9 <18 ng/L    Comment: (NOTE) Elevated high sensitivity troponin I (hsTnI) values and significant  changes across serial measurements may suggest ACS but many other  chronic and acute conditions are known to elevate hsTnI results.  Refer to the "Links" section for chest pain algorithms and additional  guidance. Performed at [facility], [place] [address], [place], [place] [address]   Brain natriuretic peptide     Status: None   Collection Time: 03/28/22  2:10 AM  Result Value Ref Range   B Natriuretic Peptide 12.5 0.0 - 100.0 pg/mL    Comment: Performed at [facility] [address] [place], [place] [address]  D-dimer, quantitative     Status: Abnormal   Collection Time: 03/28/22  2:10 AM  Result Value Ref Range   D-Dimer, Quant 0.54 (H)  0.00 - 0.50 ug/mL-FEU    Comment: (NOTE) At the manufacturer cut-off value of 0.5 g/mL FEU, this assay has a negative predictive value of 95-100%.This assay is intended for use in conjunction with a clinical pretest probability (PTP) assessment model to exclude pulmonary embolism (PE) and deep venous thrombosis (DVT) in outpatients suspected of PE or DVT. Results should be correlated with clinical presentation. Performed at [facility], [place] [address], [place], [place]  [address]   Ethanol     Status: None   Collection Time: 03/28/22  2:10 AM  Result Value Ref Range   Alcohol, Ethyl (B) <10 <10 mg/dL    Comment: (NOTE) Lowest detectable limit for serum alcohol is 10 mg/dL.  For medical purposes only. Performed at [facility], [place] [address] [place], [place] [address]   TSH     Status: None   Collection Time: 03/28/22  2:10 AM  Result Value Ref Range   TSH 1.624 0.350 - 4.500 uIU/mL    Comment: Performed by a 3rd Generation assay with a functional sensitivity of <=0.01 uIU/mL. Performed at [facility], [place] [address], [place], [place] [address]   Troponin I (High Sensitivity)     Status: None   Collection Time: 03/28/22  3:55 AM  Result Value Ref Range   Troponin I (High Sensitivity) 10 <18 ng/L    Comment: (NOTE) Elevated high sensitivity troponin I (hsTnI) values and significant  changes across serial measurements may suggest ACS but many other  chronic and acute conditions are known to elevate hsTnI results.  Refer to the "Links" section for chest pain algorithms and additional  guidance. Performed at [facility], [place] [address] [place], [place] [address]   Protime-INR     Status: None   Collection Time: 03/28/22  6:50 AM  Result Value Ref Range   Prothrombin Time 13.1 11.4 - 15.2 seconds   INR 1.0 0.8 - 1.2    Comment: (NOTE) INR goal varies based on device and disease states. Performed at [facility], [place] [address]  [place], [place] [address]   APTT     Status: None   Collection Time: 03/28/22  6:50 AM  Result Value Ref Range   aPTT 24 24 - 36 seconds    Comment: Performed at [facility] [address], [place], [place] [address]   CT Angio Chest PE W and/or Wo Contrast  Result Date: 03/28/2022 CLINICAL DATA:  Tachycardia with positive D-dimer.  Palpitations. EXAM: CT ANGIOGRAPHY CHEST WITH CONTRAST TECHNIQUE: Multidetector CT imaging of the chest was performed using the standard protocol during bolus administration of intravenous contrast. Multiplanar CT image reconstructions and MIPs were obtained to evaluate the vascular anatomy. RADIATION DOSE REDUCTION: This exam was performed according to the departmental dose-optimization program which includes automated exposure control, adjustment of the mA and/or kV according to patient size and/or use of iterative reconstruction technique. CONTRAST:  31mL OMNIPAQUE IOHEXOL 350 MG/ML SOLN COMPARISON:  06/27/2016 FINDINGS: Cardiovascular: The heart size is normal. No substantial pericardial effusion. There is marked mild cardial thinning in the left ventricular apex with aneurysm/pseudoaneurysm evident. Filling defect in the apex of the left ventricle is associated with dystrophic calcification. Unclear whether the calcification is myocardial or associated with the filling defect itself. Findings concerning for intraventricular thrombus. There is no filling defect within the opacified pulmonary arteries to suggest the presence of an acute pulmonary embolus. Mediastinum/Nodes: No mediastinal lymphadenopathy. There is no hilar lymphadenopathy. The esophagus has normal imaging features. There is no axillary lymphadenopathy. Lungs/Pleura: Dependent atelectasis noted both lung bases. No pulmonary edema or pleural effusion. No suspicious pulmonary nodule or mass. Upper Abdomen: Subtle nodularity of liver contour raises the question of cirrhosis. Gallbladder surgically absent.  Musculoskeletal: No worrisome lytic or sclerotic osseous abnormality. Segmentation anomaly noted at T11. Review of the MIP images confirms the above findings. IMPRESSION: 1. No CT evidence for acute pulmonary  embolus. 2. Marked mild cardial thinning in the left ventricular apex with aneurysm/pseudoaneurysm evident. Filling defect in the left ventricle is associated with dystrophic calcification. Unclear whether the calcification is in the mild cardia may or may not be associated with the filling defect itself. Findings concerning for intraventricular thrombus. Echocardiography would likely prove helpful to further evaluate. 3. Subtle nodularity of liver contour raises the question of cirrhosis. Critical Value/emergent results were called by telephone at the time of interpretation on 03/28/2022 at 6:02 am to provider Georgina Snell , who verbally acknowledged these results. Electronically Signed   By: Misty Stanley M.D.   On: 03/28/2022 06:02   CT Head Wo Contrast  Result Date: 03/28/2022 CLINICAL DATA:  32 year old male with hypertension, confusion, tachycardia. EXAM: CT HEAD WITHOUT CONTRAST TECHNIQUE: Contiguous axial images were obtained from the base of the skull through the vertex without intravenous contrast. RADIATION DOSE REDUCTION: This exam was performed according to the departmental dose-optimization program which includes automated exposure control, adjustment of the mA and/or kV according to patient size and/or use of iterative reconstruction technique. COMPARISON:  Head CT 07/10/2015. FINDINGS: Brain: Cerebral volume is stable, within normal limits. No midline shift, ventriculomegaly, mass effect, evidence of mass lesion, intracranial hemorrhage or evidence of cortically based acute infarction. Gray-white matter differentiation is within normal limits throughout the brain. Chronic partially empty sella. Vascular:  No suspicious intracranial vascular hyperdensity. Skull: Stable, negative.  Sinuses/Orbits: Visualized paranasal sinuses and mastoids are stable and well aerated. Other: Visualized orbits and scalp soft tissues are within normal limits. IMPRESSION: Stable noncontrast Head CT, normal aside from Chronic partially empty sella which is often a normal anatomic variant but can be associated with idiopathic intracranial hypertension (pseudotumor cerebri). Electronically Signed   By: Genevie Ann M.D.   On: 03/28/2022 05:45   DG Chest Portable 1 View  Result Date: 03/28/2022 CLINICAL DATA:  Palpitations.  Left chest discomfort. EXAM: PORTABLE CHEST 1 VIEW COMPARISON:  07/13/2017 FINDINGS: Shallow inspiration. Heart size and pulmonary vascularity are normal for technique. Suggestion of infiltration or atelectasis in the left lung base. No pleural effusions. No pneumothorax. Mediastinal contours appear intact. Surgical clips in the right upper quadrant. IMPRESSION: Mild infiltration or atelectasis in the left lung base. Electronically Signed   By: Lucienne Capers M.D.   On: 03/28/2022 02:59    Pending Labs Unresulted Labs (From admission, onward)     Start     Ordered   03/29/22 0500  Protime-INR  Daily,   R      03/28/22 0748   03/28/22 1500  Heparin level (unfractionated)  Once-Timed,   URGENT        03/28/22 0636   03/28/22 0205  Rapid urine drug screen (hospital performed)  ONCE - STAT,   STAT        03/28/22 0204   Signed and Held  HIV Antibody (routine testing w rflx)  (HIV Antibody (Routine testing w reflex) panel)  Once,   R        Signed and Held   Signed and Held  Basic metabolic panel  Tomorrow morning,   R        Signed and Held   Signed and Held  Lipid panel  Tomorrow morning,   R        Signed and Held   Signed and Held  CBC  Tomorrow morning,   R        Signed and Held   Signed and Held  Protime-INR  Tomorrow morning,   R        Signed and Held            Vitals/Pain Today's Vitals   03/28/22 0730 03/28/22 0745 03/28/22 0800 03/28/22 0809  BP: 116/77  121/76 124/73   Pulse: 78 72 66   Resp: (!) 21 14 15    Temp:    98.9 F (37.2 C)  TempSrc:    Oral  SpO2: 96% 95% 93%   Weight:      Height:      PainSc:        Isolation Precautions No active isolations  Medications Medications  heparin ADULT infusion 100 units/mL (25000 units/236mL) (1,250 Units/hr Intravenous New Bag/Given 03/28/22 0656)  Warfarin - Pharmacist Dosing Inpatient (has no administration in time range)  warfarin (COUMADIN) tablet 10 mg (has no administration in time range)  lactated ringers bolus 500 mL (0 mLs Intravenous Stopped 03/28/22 0458)  LORazepam (ATIVAN) tablet 0.5 mg (0.5 mg Oral Given 03/28/22 0255)  iohexol (OMNIPAQUE) 350 MG/ML injection 75 mL (75 mLs Intravenous Contrast Given 03/28/22 0533)  heparin bolus via infusion 5,000 Units (5,000 Units Intravenous Bolus from Bag 03/28/22 0659)    Mobility walks     Focused Assessments Cardiac Assessment Handoff:  Cardiac Rhythm: Sinus tachycardia Lab Results  Component Value Date   TROPONINI <0.03 12/24/2014   Lab Results  Component Value Date   DDIMER 0.54 (H) 03/28/2022   Does the Patient currently have chest pain? No    R Recommendations: See Admitting Provider Note  Report given to:   Additional Notes: pt is snoring soundly

## 2022-03-28 NOTE — ED Triage Notes (Signed)
Pt reports feeling very "poorly."  States his blood pressure is high and "it feels like my heart is beating out of my chest."  Pt states he did drink some ETOH.  Pt states he had a "stressful day."  Pt took 162 ASA PTA

## 2022-03-28 NOTE — Progress Notes (Signed)
ANTICOAGULATION CONSULT NOTE   Pharmacy Consult for Heparin + Warfarin Indication:  Concern for LV thrombus on CT Angio  Allergies  Allergen Reactions   Bee Pollen Hives, Itching and Rash    Patient Measurements: Height: 5\' 7"  (170.2 cm) Weight: 78 kg (172 lb) IBW/kg (Calculated) : 66.1  Vital Signs: Temp: 98 F (36.7 C) (03/23 2027) Temp Source: Oral (03/23 2027) BP: 120/68 (03/23 2027) Pulse Rate: 73 (03/23 2027)  Labs: Recent Labs    03/28/22 0210 03/28/22 0355 03/28/22 0650 03/28/22 1454 03/28/22 2135  HGB 16.5  --   --   --   --   HCT 49.2  --   --   --   --   PLT 333  --   --   --   --   APTT  --   --  24  --   --   LABPROT  --   --  13.1  --   --   INR  --   --  1.0  --   --   HEPARINUNFRC  --   --   --  0.46 0.48  CREATININE 0.99  --   --   --   --   TROPONINIHS 9 10  --   --   --      Estimated Creatinine Clearance: 100.2 mL/min (by C-G formula based on SCr of 0.99 mg/dL).   Assessment: 32 y/o M presented to the ED with palpitations/anxiety. CT angio with concern for LV thrombus. Starting heparin. PTA meds reviewed. Above labs reviewed. Pharmacy consulted to overlap with warfarin. Baseline INR 1.   Heparin level remains therapeutic (0.48) on infusion at 1250 units/hr.    Goal of Therapy:  INR 2-3 Heparin level 0.3-0.7 units/ml Monitor platelets by anticoagulation protocol: Yes   Plan:  Continue heparin at 1250 units/hr Daily CBC/Heparin level  Sherlon Handing, PharmD, BCPS Please see amion for complete clinical pharmacist phone list 03/28/2022  10:27 PM

## 2022-03-28 NOTE — Progress Notes (Signed)
Echocardiogram 2D Echocardiogram has been performed.  Chad Fitzgerald 03/28/2022, 1:32 PM

## 2022-03-29 LAB — CBC
HCT: 49.4 % (ref 39.0–52.0)
Hemoglobin: 16.4 g/dL (ref 13.0–17.0)
MCH: 28.8 pg (ref 26.0–34.0)
MCHC: 33.2 g/dL (ref 30.0–36.0)
MCV: 86.7 fL (ref 80.0–100.0)
Platelets: 309 10*3/uL (ref 150–400)
RBC: 5.7 MIL/uL (ref 4.22–5.81)
RDW: 13 % (ref 11.5–15.5)
WBC: 10.7 10*3/uL — ABNORMAL HIGH (ref 4.0–10.5)
nRBC: 0 % (ref 0.0–0.2)

## 2022-03-29 LAB — BASIC METABOLIC PANEL
Anion gap: 7 (ref 5–15)
BUN: 14 mg/dL (ref 6–20)
CO2: 23 mmol/L (ref 22–32)
Calcium: 9.2 mg/dL (ref 8.9–10.3)
Chloride: 108 mmol/L (ref 98–111)
Creatinine, Ser: 1 mg/dL (ref 0.61–1.24)
GFR, Estimated: >60 mL/min (ref >60)
Glucose, Bld: 92 mg/dL (ref 70–99)
Potassium: 3.6 mmol/L (ref 3.5–5.1)
Sodium: 138 mmol/L (ref 135–145)

## 2022-03-29 LAB — PROTIME-INR
INR: 1 (ref 0.8–1.2)
Prothrombin Time: 13 seconds (ref 11.4–15.2)

## 2022-03-29 LAB — LIPID PANEL
Cholesterol: 177 mg/dL (ref 0–200)
HDL: 43 mg/dL (ref >40)
LDL Cholesterol: 103 mg/dL — ABNORMAL HIGH (ref 0–99)
Total CHOL/HDL Ratio: 4.1 RATIO
Triglycerides: 157 mg/dL — ABNORMAL HIGH (ref <150)
VLDL: 31 mg/dL (ref 0–40)

## 2022-03-29 LAB — HEPARIN LEVEL (UNFRACTIONATED): Heparin Unfractionated: 0.58 IU/mL (ref 0.30–0.70)

## 2022-03-29 MED ORDER — WARFARIN SODIUM 5 MG PO TABS
10.0000 mg | ORAL_TABLET | Freq: Once | ORAL | Status: AC
  Administered 2022-03-29: 10 mg via ORAL
  Filled 2022-03-29: qty 2

## 2022-03-29 MED ORDER — METOPROLOL SUCCINATE ER 25 MG PO TB24
12.5000 mg | ORAL_TABLET | Freq: Every day | ORAL | Status: DC
  Administered 2022-03-29 – 2022-04-01 (×4): 12.5 mg via ORAL
  Filled 2022-03-29 (×4): qty 1

## 2022-03-29 NOTE — Progress Notes (Signed)
ANTICOAGULATION CONSULT NOTE - Initial Consult  Pharmacy Consult for Heparin + Warfarin Indication:  Concern for LV thrombus on CT Angio  Allergies  Allergen Reactions   Bee Pollen Hives, Itching and Rash    Patient Measurements: Height: 5\' 7"  (170.2 cm) Weight: 78 kg (172 lb) IBW/kg (Calculated) : 66.1  Vital Signs: Temp: 98.7 F (37.1 C) (03/23 2307) Temp Source: Oral (03/23 2307) BP: 106/69 (03/23 2307) Pulse Rate: 82 (03/23 2307)  Labs: Recent Labs    03/28/22 0210 03/28/22 0355 03/28/22 0650 03/28/22 1454 03/28/22 2135 03/29/22 0151  HGB 16.5  --   --   --   --  16.4  HCT 49.2  --   --   --   --  49.4  PLT 333  --   --   --   --  309  APTT  --   --  24  --   --   --   LABPROT  --   --  13.1  --   --  13.0  INR  --   --  1.0  --   --  1.0  HEPARINUNFRC  --   --   --  0.46 0.48 0.58  CREATININE 0.99  --   --   --   --  1.00  TROPONINIHS 9 10  --   --   --   --      Estimated Creatinine Clearance: 99.2 mL/min (by C-G formula based on SCr of 1 mg/dL).   Medical History: Past Medical History:  Diagnosis Date   ADD (attention deficit disorder)    Anxiety    Asthma    as a child   CAD (coronary artery disease)    a. 04/28/14: acute anterolateral STEMI s/p DES to LAD   Gallstones    GERD (gastroesophageal reflux disease)    Gout    Hyperglycemia    a. 04/2014: Hg A1c 5.3   Hypertension    Ischemic cardiomyopathy    a. 04/29/13: 2D ECHO: LVEF 35-40%   Low serum HDL    MI (myocardial infarction) ([place])    Wears glasses    astigmatism     Assessment: 32 y/o M presented to the ED with palpitations/anxiety. CT angio with concern for LV thrombus. Starting heparin. PTA meds reviewed. Above labs reviewed. Pharmacy consulted to overlap with warfarin. Baseline INR 1.   Daily heparin level therapeutic at 0.58. Hgb 16.4, PLT 309 wnl. No issues with heparin infusion or s/sx bleeding noted. INR 1.0 on Day 2 of warfarin.   Goal of Therapy:  INR 2-3 Heparin level  0.3-0.7 units/ml Monitor platelets by anticoagulation protocol: Yes   Plan:  Continue heparin at 1250 units/hr Daily CBC/Heparin level Warfarin 10mg  po x1 at 1600 PM (per dosing nomogram) Daily PT/INR Monitor for bleeding Continue Warfarin + Heparin overlap for at least 5 days AND INR >2 for 2 days  Eliseo Gum, PharmD PGY1 Pharmacy Resident   03/29/2022  8:18 AM

## 2022-03-29 NOTE — Plan of Care (Signed)
Plan of care initiated and progressing 

## 2022-03-29 NOTE — Progress Notes (Signed)
   Rounding Note    Patient Name: Chad Fitzgerald Date of Encounter: 03/29/2022  [place] Cardiologist: None   Subjective   NAEO.    Vital Signs    Vitals:   03/28/22 1304 03/28/22 1721 03/28/22 2027 03/28/22 2307  BP: 133/88 129/62 120/68 106/69  Pulse:  84 73 82  Resp: 14 12 16 18   Temp: 98.1 F (36.7 C) 98.4 F (36.9 C) 98 F (36.7 C) 98.7 F (37.1 C)  TempSrc: Oral Oral Oral Oral  SpO2:  98% 96% 97%  Weight:      Height:        Intake/Output Summary (Last 24 hours) at 03/29/2022 0829 Last data filed at 03/29/2022 0500 Gross per 24 hour  Intake 322.09 ml  Output --  Net 322.09 ml      03/28/2022    1:51 AM 07/13/2017   12:02 AM 07/11/2017    4:45 AM  Last 3 Weights  Weight (lbs) 172 lb 170 lb 170 lb  Weight (kg) 78.019 kg 77.111 kg 77.111 kg      Telemetry    Personally Reviewed  ECG    Personally Reviewed  Physical Exam   GEN: No acute distress.   Cardiac: RRR, no murmurs, rubs, or gallops.  Psych: Normal affect    Assessment & Plan    #LV thrombus Aneurysmal LV apex. Heparin gtt Coumadin started Plan for cMR to assess myocardium, scar burden, EF  #HFrEF #ICM #CAD NYHA II. Warm and dry. EF 40-45% on TTE. Cont aspirin GDMT is going to be difficult due to hypotension. Will add very low dose toprol today. If tolerates, can potentially add low dose entresto with careful monitoring of BP  He will need SW consult this week to help with financial resources given diagnosis.  For questions or updates, please contact [place] Please consult [email or web address] for contact info under        Signed, Vickie Epley, MD  03/29/2022, 8:29 AM

## 2022-03-30 ENCOUNTER — Other Ambulatory Visit (HOSPITAL_COMMUNITY): Payer: Self-pay

## 2022-03-30 ENCOUNTER — Inpatient Hospital Stay (HOSPITAL_COMMUNITY): Payer: Self-pay

## 2022-03-30 DIAGNOSIS — I255 Ischemic cardiomyopathy: Secondary | ICD-10-CM

## 2022-03-30 LAB — CBC
HCT: 49.5 % (ref 39.0–52.0)
Hemoglobin: 17.2 g/dL — ABNORMAL HIGH (ref 13.0–17.0)
MCH: 29.5 pg (ref 26.0–34.0)
MCHC: 34.7 g/dL (ref 30.0–36.0)
MCV: 84.8 fL (ref 80.0–100.0)
Platelets: 327 10*3/uL (ref 150–400)
RBC: 5.84 MIL/uL — ABNORMAL HIGH (ref 4.22–5.81)
RDW: 12.7 % (ref 11.5–15.5)
WBC: 10.8 10*3/uL — ABNORMAL HIGH (ref 4.0–10.5)
nRBC: 0 % (ref 0.0–0.2)

## 2022-03-30 LAB — PROTIME-INR
INR: 1.1 (ref 0.8–1.2)
Prothrombin Time: 14.2 seconds (ref 11.4–15.2)

## 2022-03-30 LAB — HEPARIN LEVEL (UNFRACTIONATED): Heparin Unfractionated: 0.59 IU/mL (ref 0.30–0.70)

## 2022-03-30 MED ORDER — RIVAROXABAN 20 MG PO TABS
20.0000 mg | ORAL_TABLET | Freq: Every day | ORAL | Status: DC
  Administered 2022-03-30 – 2022-03-31 (×2): 20 mg via ORAL
  Filled 2022-03-30 (×2): qty 1

## 2022-03-30 MED ORDER — SACUBITRIL-VALSARTAN 24-26 MG PO TABS
1.0000 | ORAL_TABLET | Freq: Two times a day (BID) | ORAL | Status: DC
  Administered 2022-03-30 – 2022-03-31 (×2): 1 via ORAL
  Filled 2022-03-30 (×2): qty 1

## 2022-03-30 MED ORDER — WARFARIN SODIUM 5 MG PO TABS
12.5000 mg | ORAL_TABLET | Freq: Once | ORAL | Status: DC
  Filled 2022-03-30: qty 1

## 2022-03-30 MED ORDER — GADOBUTROL 1 MMOL/ML IV SOLN
8.0000 mL | Freq: Once | INTRAVENOUS | Status: AC | PRN
  Administered 2022-03-30: 8 mL via INTRAVENOUS
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
## 2022-03-30 NOTE — Progress Notes (Signed)
ANTICOAGULATION CONSULT NOTE - Follow-Up Consult  Pharmacy Consult for Heparin + Warfarin Indication:  Concern for LV thrombus on CT Angio  Allergies  Allergen Reactions   Bee Pollen Hives, Itching and Rash    Patient Measurements: Height: 5\' 7"  (170.2 cm) Weight: 78 kg (172 lb) IBW/kg (Calculated) : 66.1  Vital Signs:    Labs: Recent Labs    03/28/22 0210 03/28/22 0355 03/28/22 0650 03/28/22 1454 03/28/22 2135 03/29/22 0151 03/30/22 0119  HGB 16.5  --   --   --   --  16.4 17.2*  HCT 49.2  --   --   --   --  49.4 49.5  PLT 333  --   --   --   --  309 327  APTT  --   --  24  --   --   --   --   LABPROT  --   --  13.1  --   --  13.0 14.2  INR  --   --  1.0  --   --  1.0 1.1  HEPARINUNFRC  --   --   --    < > 0.48 0.58 0.59  CREATININE 0.99  --   --   --   --  1.00  --   TROPONINIHS 9 10  --   --   --   --   --    < > = values in this interval not displayed.     Estimated Creatinine Clearance: 99.2 mL/min (by C-G formula based on SCr of 1 mg/dL).   Medical History: Past Medical History:  Diagnosis Date   ADD (attention deficit disorder)    Anxiety    Asthma    as a child   CAD (coronary artery disease)    a. 04/28/14: acute anterolateral STEMI s/p DES to LAD   Gallstones    GERD (gastroesophageal reflux disease)    Gout    Hyperglycemia    a. 04/2014: Hg A1c 5.3   Hypertension    Ischemic cardiomyopathy    a. 04/29/13: 2D ECHO: LVEF 35-40%   Low serum HDL    MI (myocardial infarction) ([place])    Wears glasses    astigmatism     Assessment: 32 y/o M presented to the ED with palpitations/anxiety. CT angio with concern for LV thrombus. Starting heparin. PTA meds reviewed. Above labs reviewed. Pharmacy consulted to overlap with warfarin. Baseline INR 1.   Plan to change from warfarin to Xarelto. INR today was 1.1. CBC stable. No s/sx of bleeding.   Goal of Therapy:  INR 2-3 Heparin level 0.3-0.7 units/ml Monitor platelets by anticoagulation protocol:  Yes   Plan:  Stop heparin infusion Discontinue warfarin orders Will order Xarelto 20 mg tonight Monitor daily CBC and for s/sx of bleeding.  Antonietta Jewel, PharmD, [place] Clinical Pharmacist  Phone: [phone] 03/30/2022 5:16 PM  Please check AMION for all [place] phone numbers After 10:00 PM, call [place] [phone]

## 2022-03-30 NOTE — Progress Notes (Signed)
ANTICOAGULATION CONSULT NOTE - Follow-Up Consult  Pharmacy Consult for Heparin + Warfarin Indication:  Concern for LV thrombus on CT Angio  Allergies  Allergen Reactions   Bee Pollen Hives, Itching and Rash    Patient Measurements: Height: 5\' 7"  (170.2 cm) Weight: 78 kg (172 lb) IBW/kg (Calculated) : 66.1  Vital Signs:    Labs: Recent Labs    03/28/22 0210 03/28/22 0355 03/28/22 0650 03/28/22 1454 03/28/22 2135 03/29/22 0151 03/30/22 0119  HGB 16.5  --   --   --   --  16.4 17.2*  HCT 49.2  --   --   --   --  49.4 49.5  PLT 333  --   --   --   --  309 327  APTT  --   --  24  --   --   --   --   LABPROT  --   --  13.1  --   --  13.0 14.2  INR  --   --  1.0  --   --  1.0 1.1  HEPARINUNFRC  --   --   --    < > 0.48 0.58 0.59  CREATININE 0.99  --   --   --   --  1.00  --   TROPONINIHS 9 10  --   --   --   --   --    < > = values in this interval not displayed.     Estimated Creatinine Clearance: 99.2 mL/min (by C-G formula based on SCr of 1 mg/dL).   Medical History: Past Medical History:  Diagnosis Date   ADD (attention deficit disorder)    Anxiety    Asthma    as a child   CAD (coronary artery disease)    a. 04/28/14: acute anterolateral STEMI s/p DES to LAD   Gallstones    GERD (gastroesophageal reflux disease)    Gout    Hyperglycemia    a. 04/2014: Hg A1c 5.3   Hypertension    Ischemic cardiomyopathy    a. 04/29/13: 2D ECHO: LVEF 35-40%   Low serum HDL    MI (myocardial infarction) ([place])    Wears glasses    astigmatism     Assessment: 32 y/o M presented to the ED with palpitations/anxiety. CT angio with concern for LV thrombus. Starting heparin. PTA meds reviewed. Above labs reviewed. Pharmacy consulted to overlap with warfarin. Baseline INR 1.   Heparin level is therapeutic at 0.59.   INR 1.1, trending up slightly on Day 3 of warfarin, remains subtherapeutic.   Hgb 17.2 and PLT 327 are stable.  No s/sx bleeding noted.   Goal of Therapy:   INR 2-3 Heparin level 0.3-0.7 units/ml Monitor platelets by anticoagulation protocol: Yes   Plan:  Continue heparin infusion at 1250 units/hr Warfarin 12.5mg  po x1 at 1600 PM  Continue Warfarin + Heparin overlap for at least 5 days AND INR >2 for 2 days Monitor daily CBC, INR, heparin level, and for s/sx of bleeding.   Luisa Hart, PharmD, BCPS Clinical Pharmacist 03/30/2022 8:51 AM   Please refer to [facility] for pharmacy phone number
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
## 2022-03-31 ENCOUNTER — Encounter (HOSPITAL_COMMUNITY): Payer: Self-pay | Admitting: Internal Medicine

## 2022-03-31 LAB — COMPREHENSIVE METABOLIC PANEL
ALT: 32 U/L (ref 0–44)
AST: 23 U/L (ref 15–41)
Albumin: 3.9 g/dL (ref 3.5–5.0)
Alkaline Phosphatase: 67 U/L (ref 38–126)
Anion gap: 10 (ref 5–15)
BUN: 15 mg/dL (ref 6–20)
CO2: 24 mmol/L (ref 22–32)
Calcium: 9.6 mg/dL (ref 8.9–10.3)
Chloride: 102 mmol/L (ref 98–111)
Creatinine, Ser: 0.94 mg/dL (ref 0.61–1.24)
GFR, Estimated: >60 mL/min (ref >60)
Glucose, Bld: 93 mg/dL (ref 70–99)
Potassium: 3.9 mmol/L (ref 3.5–5.1)
Sodium: 136 mmol/L (ref 135–145)
Total Bilirubin: 0.9 mg/dL (ref 0.3–1.2)
Total Protein: 7.4 g/dL (ref 6.5–8.1)

## 2022-03-31 LAB — CBC
HCT: 52.7 % — ABNORMAL HIGH (ref 39.0–52.0)
Hemoglobin: 18.6 g/dL — ABNORMAL HIGH (ref 13.0–17.0)
MCH: 29.9 pg (ref 26.0–34.0)
MCHC: 35.3 g/dL (ref 30.0–36.0)
MCV: 84.6 fL (ref 80.0–100.0)
Platelets: 330 10*3/uL (ref 150–400)
RBC: 6.23 MIL/uL — ABNORMAL HIGH (ref 4.22–5.81)
RDW: 12.8 % (ref 11.5–15.5)
WBC: 10.8 10*3/uL — ABNORMAL HIGH (ref 4.0–10.5)
nRBC: 0 % (ref 0.0–0.2)

## 2022-03-31 LAB — MAGNESIUM: Magnesium: 2.3 mg/dL (ref 1.7–2.4)

## 2022-03-31 MED ORDER — FAMOTIDINE 20 MG PO TABS
40.0000 mg | ORAL_TABLET | Freq: Once | ORAL | Status: AC
  Administered 2022-03-31: 40 mg via ORAL
  Filled 2022-03-31: qty 2

## 2022-03-31 MED ORDER — DIPHENHYDRAMINE HCL 25 MG PO CAPS
25.0000 mg | ORAL_CAPSULE | Freq: Once | ORAL | Status: AC
  Administered 2022-03-31: 25 mg via ORAL
  Filled 2022-03-31: qty 1

## 2022-03-31 MED ORDER — DEXAMETHASONE 4 MG PO TABS
4.0000 mg | ORAL_TABLET | Freq: Once | ORAL | Status: AC
  Administered 2022-03-31: 4 mg via ORAL
  Filled 2022-03-31: qty 1

## 2022-03-31 NOTE — Progress Notes (Addendum)
Notified by nurse that patient felt like his lips were becoming more puffy, particularly the upper lip. Received first dose of Entresto last night then again this AM around 10:30am. I examined patient at bedside. He remains A+Ox3, calm, affect appropriate, in no acute distress without any difficulty breathing. He has fullness of both upper and lower lips. No overt dramatic swelling seen at this time, however, he can feel like the upper lip is enlarged. VS are stable. O2 normal. No respiratory distress. Lungs clear. No stridor. Patient just recently ambulated without difficulty. Airway is patent without tonsillar or tongue enlargement. In Dr. Rosezella [place] absence I discussed with on call MD Dr. Margaretann Loveless. Per our discussion, plan discontinuation of suspected offending agent (in this case Delene Loll seems most likely), continue to observe for any airway effects, no indication for escalating airway management at this time, and involved pharmD in the update. Per d/w pharmacist, data inconclusive regarding whether traditional antihistamine/steroid regimens are effective at treatment but come with little harm so will per our discussion will give the recommended famotidine 40mg  PO x1, benadryl 25mg  PO x1, and dexamethasone 4mg  PO x1. Patient is also aware to notify for any escalating symptoms and nurse will keep [place] apprised of any new updates.  Addendum: received update from pharmD around 2:20 that she also had gone by to speak to patient and symptoms have not progressed. Continue to monitor.
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
## 2022-03-31 NOTE — Discharge Instructions (Signed)
   ___________________________________________________________________________________________________________________________________________ Information on my medicine - XARELTO (rivaroxaban)  This medication education was reviewed with me or my healthcare representative as part of my discharge preparation.  The pharmacist that spoke with me during my hospital stay was:  Kaleen Mask, [place]? Xarelto was prescribed to treat a blood clot that may has been found in the left ventricle (LV) of your heart and to reduce the risk of blood clots occurring again.  What do you need to know about Xarelto? The starting dose is one 15 mg tablet taken TWICE daily with food for the FIRST 21 DAYS then on (enter date)  ***  the dose is changed to one 20 mg tablet taken ONCE A DAY with your evening meal.  DO NOT stop taking Xarelto without talking to the health care provider who prescribed the medication.  Refill your prescription for 20 mg tablets before you run out.  After discharge, you should have regular check-up appointments with your healthcare provider that is prescribing your Xarelto.  In the future your dose may need to be changed if your kidney function changes by a significant amount.  What do you do if you miss a dose? If you are taking Xarelto TWICE DAILY and you miss a dose, take it as soon as you remember. You may take two 15 mg tablets (total 30 mg) at the same time then resume your regularly scheduled 15 mg twice daily the next day.  If you are taking Xarelto ONCE DAILY and you miss a dose, take it as soon as you remember on the same day then continue your regularly scheduled once daily regimen the next day. Do not take two doses of Xarelto at the same time.   Important Safety Information Xarelto is a blood thinner medicine that can cause bleeding. You should call your healthcare provider right away if you experience any of the following: Bleeding  from an injury or your nose that does not stop. Unusual colored urine (red or dark brown) or unusual colored stools (red or black). Unusual bruising for unknown reasons. A serious fall or if you hit your head (even if there is no bleeding).  Some medicines may interact with Xarelto and might increase your risk of bleeding while on Xarelto. To help avoid this, consult your healthcare provider or pharmacist prior to using any new prescription or non-prescription medications, including herbals, vitamins, non-steroidal anti-inflammatory drugs (NSAIDs) and supplements.  This website has more information on Xarelto: [email or web address]

## 2022-03-31 NOTE — Progress Notes (Signed)
Heart Failure Nurse Navigator Progress Note  PCP: Patient, No Pcp Per PCP-Cardiologist: [place] Admission Diagnosis: Heart palpitations, LV mural thrombus Admitted from: Home  Presentation:   Chad Fitzgerald presented with feeling "poorly" blood pressure high and feels like his heart is beating out of his chest. Patient reported to drinking ETOH due to a stressful day. P 133/88, HR 81, IV heparin started, CTA PE concerning for a large LV thrombus, cMRI assess myocardium.   Patient and his mother were educated on the sign and symptoms of heart failure, daily weights, when to call his doctor or go to the ED, Diet/ fluid restrictions, taking all medications as prescribed and attending all medical appointments. PAtient verbalized his understanding. A HF TOC appointment was scheduled for 04/20/2022 @ 9 am after his [facility] appointment on 04/14/2022 per provider Eugenia Mcalpine, PA request for SDOH needs.   ECHO/ LVEF: 40-45%   Clinical Course:  Past Medical History:  Diagnosis Date   ADD (attention deficit disorder)    Anxiety    Asthma    as a child   CAD (coronary artery disease)    a. 04/28/14: acute anterolateral STEMI s/p DES to LAD   Gallstones    GERD (gastroesophageal reflux disease)    Gout    Hyperglycemia    a. 04/2014: Hg A1c 5.3   Hypertension    Ischemic cardiomyopathy    a. 04/29/13: 2D ECHO: LVEF 35-40%   Low serum HDL    MI (myocardial infarction) (HCC)    Wears glasses    astigmatism     Social History   Socioeconomic History   Marital status: Single    Spouse name: Not on file   Number of children: Not on file   Years of education: Not on file   Highest education level: Not on file  Occupational History   Not on file  Tobacco Use   Smoking status: Former    Types: Cigarettes    Quit date: 2011    Years since quitting: 13.2   Smokeless tobacco: Never  Vaping Use   Vaping Use: Never used  Substance and Sexual Activity   Alcohol use: Yes    Comment: occ    Drug use: No    Frequency: 2.0 times per week   Sexual activity: Not on file  Other Topics Concern   Not on file  Social History Narrative   Not on file   Social Determinants of Health   Financial Resource Strain: Not on file  Food Insecurity: Not on file  Transportation Needs: Not on file  Physical Activity: Not on file  Stress: Not on file  Social Connections: Not on file   Education Assessment and Provision:  Detailed education and instructions provided on heart failure disease management including the following:  Signs and symptoms of Heart Failure When to call the physician Importance of daily weights Low sodium diet Fluid restriction Medication management Anticipated future follow-up appointments  Patient education given on each of the above topics.  Patient acknowledges understanding via teach back method and acceptance of all instructions.  Education Materials:  "Living Better With Heart Failure" Booklet, HF zone tool, & Daily Weight Tracker Tool.  Patient has scale at home: yes Patient has pill box at home: NA    High Risk Criteria for Readmission and/or Poor Patient Outcomes: Heart failure hospital admissions (last 6 months): 0  No Show rate: 23 % Difficult social situation: No Demonstrates medication adherence: No, stopped taking , when he felt better.  Primary Language: english Literacy level: Reading, writing, and comprehension  Barriers of Care:   Medication compliance Medication cost ( no insurance)  Diet/ fluids/ daily weights  Considerations/Referrals:   Referral made to Heart Failure Pharmacist Stewardship: Yes Referral made to Heart Failure CSW/NCM TOC: Yes, Insurance  Referral made to Heart & Vascular TOC clinic: Yes, 04/20/2022 @ 9am, scheduled after [place] on 04/14/2022 per provider request.   Items for Follow-up on DC/TOC: Medication compliance/ costs Diet/ fluids/ daily weights Continued HF education   Earnestine Leys, BSN, RN Heart Failure  Leisure centre manager Chat Only

## 2022-03-31 NOTE — TOC Initial Note (Signed)
Transition of Care (TOC) - Initial/Assessment Note  Marvetta Gibbons RN, BSN Transitions of Care Unit 4E- RN Case Manager See Treatment Team for direct phone #   Patient Details  Name: Chad Fitzgerald MRN: [id number] Date of Birth: 1990/07/04  Transition of Care [facility]) CM/SW Contact:    Dawayne Patricia, RN Phone Number: 03/31/2022, 2:53 PM  Clinical Narrative:                 Noted pt without insurance and will need MATCH for medication assistance with plan for [facility] pharmacy to fill meds prior to discharge. HF pharmacist to also see pt for Brand name drug assistance programs for ongoing assistance with Xarelto.   Pt to f/u with HF clinic, appointment also made for primary care at [facility] for 4/11.   Pt placed in [facility] system per CM. Anticipated discharge for 3/27.   TOC to continue to follow.    Expected Discharge Plan: Home/Self Care Barriers to Discharge: No Barriers Identified   Patient Goals and CMS Choice Patient states their goals for this hospitalization and ongoing recovery are:: return home   Choice offered to / list presented to : NA      Expected Discharge Plan and Services   Discharge Planning Services: CM Consult, [place] Program Post Acute Care Choice: NA                   DME Arranged: N/A DME Agency: NA       HH Arranged: NA HH Agency: NA        Prior Living Arrangements/Services   Lives with:: Relatives                   Activities of Daily Living      Permission Sought/Granted                  Emotional Assessment              Admission diagnosis:  Ischemic cardiomyopathy [I25.5] Heart palpitations [R00.2] LV (left ventricular) mural thrombus [I51.3] CAD S/P percutaneous coronary angioplasty [I25.10, Z98.61] Patient Active Problem List   Diagnosis Date Noted   LV (left ventricular) mural thrombus 03/28/2022   Prothrombin gene mutation ([place]) 01/02/2015   Anxiety disorder due to general medical  condition with panic attack 01/02/2015   Unstable angina ([place]) 12/24/2014   Abnormal nuclear stress test    Chest pain 12/21/2014   Low serum HDL    Hyperglycemia    Ischemic cardiomyopathy    CAD S/P LAD PCI - 3 mm x 18 mm long Xience DES (3.3 mm)    Acute MI, anterolateral wall, subsequent episode of care ([place]) 04/28/2014   STEMI (ST elevation myocardial infarction) ([place]) 04/28/2014   PCP:  Patient, No Pcp Per Pharmacy:   [facility] ([place]), [place] - [address] [place] [address] Phone: [phone] Fax: [facility] [address] [place] [address] Phone: [phone] Fax: [place] [address] [place] [address] Phone: [phone] Fax: [phone]     Social Determinants of Health (SDOH) Social History: SDOH Screenings   Food Insecurity: No Food Insecurity (03/31/2022)  Housing: Low Risk  (03/31/2022)  Transportation Needs: No Transportation Needs (03/31/2022)  Alcohol Screen: Low Risk  (03/31/2022)  Financial Resource Strain: High Risk (03/31/2022)  Tobacco Use: Medium Risk (03/31/2022)  SDOH Interventions: Food Insecurity Interventions: Intervention Not Indicated Housing Interventions: Intervention Not Indicated Transportation Interventions: Intervention Not Indicated Alcohol Usage Interventions: Intervention Not Indicated (Score <7) Financial Strain Interventions: Intervention Not Indicated   Readmission Risk Interventions     No data to display

## 2022-03-31 NOTE — Progress Notes (Signed)
Reassessed pt this PM. He reports he is feeling better and lips feel better. Denies any complaints at this time. Lungs remain clear. No tongue or oropharyngeal swelling noted on exam of mouth and throat. He knows to notify nursing staff if he has any recurrent or escalating symptoms.

## 2022-04-01 ENCOUNTER — Telehealth: Payer: Self-pay | Admitting: Physician Assistant

## 2022-04-01 ENCOUNTER — Other Ambulatory Visit (HOSPITAL_COMMUNITY): Payer: Self-pay

## 2022-04-01 ENCOUNTER — Other Ambulatory Visit: Payer: Self-pay | Admitting: Physician Assistant

## 2022-04-01 DIAGNOSIS — L659 Nonscarring hair loss, unspecified: Secondary | ICD-10-CM | POA: Insufficient documentation

## 2022-04-01 DIAGNOSIS — T783XXA Angioneurotic edema, initial encounter: Secondary | ICD-10-CM | POA: Insufficient documentation

## 2022-04-01 DIAGNOSIS — D72829 Elevated white blood cell count, unspecified: Secondary | ICD-10-CM | POA: Insufficient documentation

## 2022-04-01 DIAGNOSIS — R932 Abnormal findings on diagnostic imaging of liver and biliary tract: Secondary | ICD-10-CM | POA: Insufficient documentation

## 2022-04-01 DIAGNOSIS — I5022 Chronic systolic (congestive) heart failure: Secondary | ICD-10-CM

## 2022-04-01 DIAGNOSIS — E785 Hyperlipidemia, unspecified: Secondary | ICD-10-CM | POA: Insufficient documentation

## 2022-04-01 DIAGNOSIS — D582 Other hemoglobinopathies: Secondary | ICD-10-CM | POA: Insufficient documentation

## 2022-04-01 DIAGNOSIS — R000 Tachycardia, unspecified: Secondary | ICD-10-CM | POA: Insufficient documentation

## 2022-04-01 LAB — BASIC METABOLIC PANEL
Anion gap: 11 (ref 5–15)
BUN: 15 mg/dL (ref 6–20)
CO2: 25 mmol/L (ref 22–32)
Calcium: 9.6 mg/dL (ref 8.9–10.3)
Chloride: 99 mmol/L (ref 98–111)
Creatinine, Ser: 1.16 mg/dL (ref 0.61–1.24)
GFR, Estimated: >60 mL/min (ref >60)
Glucose, Bld: 120 mg/dL — ABNORMAL HIGH (ref 70–99)
Potassium: 4.6 mmol/L (ref 3.5–5.1)
Sodium: 135 mmol/L (ref 135–145)

## 2022-04-01 LAB — CBC
HCT: 53.4 % — ABNORMAL HIGH (ref 39.0–52.0)
Hemoglobin: 18.7 g/dL — ABNORMAL HIGH (ref 13.0–17.0)
MCH: 29.7 pg (ref 26.0–34.0)
MCHC: 35 g/dL (ref 30.0–36.0)
MCV: 84.8 fL (ref 80.0–100.0)
Platelets: 357 10*3/uL (ref 150–400)
RBC: 6.3 MIL/uL — ABNORMAL HIGH (ref 4.22–5.81)
RDW: 12.6 % (ref 11.5–15.5)
WBC: 13.1 10*3/uL — ABNORMAL HIGH (ref 4.0–10.5)
nRBC: 0 % (ref 0.0–0.2)

## 2022-04-01 LAB — HEMOGLOBIN A1C
Hgb A1c MFr Bld: 5.5 % (ref 4.8–5.6)
Mean Plasma Glucose: 111 mg/dL

## 2022-04-01 MED ORDER — RIVAROXABAN 20 MG PO TABS
20.0000 mg | ORAL_TABLET | Freq: Every day | ORAL | 5 refills | Status: DC
  Filled 2022-04-01 – 2022-05-13 (×8): qty 30, 30d supply, fill #0
  Filled 2022-06-11: qty 30, 30d supply, fill #1
  Filled 2022-07-13: qty 30, 30d supply, fill #2
  Filled 2022-08-11 (×2): qty 30, 30d supply, fill #3
  Filled 2022-09-11: qty 30, 30d supply, fill #4

## 2022-04-01 MED ORDER — METOPROLOL SUCCINATE ER 25 MG PO TB24: 25.0000 mg | ORAL_TABLET | Freq: Once | ORAL | Status: DC

## 2022-04-01 MED ORDER — METOPROLOL SUCCINATE ER 25 MG PO TB24: 25.0000 mg | ORAL_TABLET | Freq: Every day | ORAL | Status: DC

## 2022-04-01 MED ORDER — ASPIRIN 81 MG PO TBEC
81.0000 mg | DELAYED_RELEASE_TABLET | Freq: Every day | ORAL | 11 refills | Status: DC
  Filled 2022-04-01: qty 30, 30d supply, fill #0

## 2022-04-01 MED ORDER — METOPROLOL SUCCINATE ER 25 MG PO TB24
25.0000 mg | ORAL_TABLET | Freq: Every day | ORAL | 5 refills | Status: DC
  Filled 2022-04-01: qty 30, 30d supply, fill #0

## 2022-04-01 MED ORDER — ATORVASTATIN CALCIUM 80 MG PO TABS
80.0000 mg | ORAL_TABLET | Freq: Every day | ORAL | 5 refills | Status: AC
  Filled 2022-04-01: qty 30, 30d supply, fill #0
  Filled 2022-07-02: qty 30, 30d supply, fill #1
  Filled 2022-09-11: qty 30, 30d supply, fill #2

## 2022-04-01 MED ORDER — METOPROLOL SUCCINATE ER 25 MG PO TB24
12.5000 mg | ORAL_TABLET | Freq: Once | ORAL | Status: AC
  Administered 2022-04-01: 12.5 mg via ORAL

## 2022-04-01 MED ORDER — NITROGLYCERIN 0.4 MG SL SUBL
0.4000 mg | SUBLINGUAL_TABLET | SUBLINGUAL | 3 refills | Status: AC | PRN
  Filled 2022-04-01: qty 25, 5d supply, fill #0
  Filled 2022-07-02: qty 25, 5d supply, fill #1

## 2022-04-01 MED ORDER — SPIRONOLACTONE 25 MG PO TABS
25.0000 mg | ORAL_TABLET | Freq: Every day | ORAL | Status: DC
  Administered 2022-04-01: 25 mg via ORAL
  Filled 2022-04-01: qty 1

## 2022-04-01 MED ORDER — SPIRONOLACTONE 25 MG PO TABS
25.0000 mg | ORAL_TABLET | Freq: Every day | ORAL | 5 refills | Status: AC
  Filled 2022-04-01: qty 30, 30d supply, fill #0

## 2022-04-01 MED ORDER — SPIRONOLACTONE 12.5 MG HALF TABLET: 12.5000 mg | ORAL_TABLET | Freq: Every day | ORAL | Status: DC

## 2022-04-01 NOTE — Telephone Encounter (Signed)
   Attention TOC pool,  This patient will need a TOC phone call after discharge. They are being discharged likely later today. Follow-up appointment has already been arranged with: Sande Rives on 4/4 - f/u for LV thrombus They are a patient of Minus Breeding, MD.  Thank you! Charlie Pitter, PA-C
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
## 2022-04-01 NOTE — Discharge Summary (Addendum)
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 within a few days - Shortness of breath, with or without a dry hacking cough  - Swelling in the hands, feet or stomach  - If you have to sleep on extra pillows at night in order to breathe   IT IS IMPORTANT TO LET YOUR DOCTOR KNOW EARLY ON IF YOU ARE HAVING SYMPTOMS SO WE CAN HELP YOU!  You are now on blood thinners. If you notice any bleeding such as blood in stool, black tarry stools, blood in urine, nosebleeds or any other unusual bleeding, call your doctor immediately. It is not normal to have this kind of bleeding while on a blood thinner and usually indicates there is an underlying problem with one of your body systems that needs to be checked out.   Increase activity slowly   Complete by: As directed    You may return to work on 04/03/22.        Discharge Medications   Allergies as of 04/01/2022        Reactions   Bee Pollen Hives, Itching, Rash   Entresto [sacubitril-valsartan] Swelling   Angioedema of lips during 03/2022 hospitalization        Medication List     STOP taking these medications    finasteride 5 MG tablet Commonly known as: PROSCAR   minoxidil 2.5 MG tablet Commonly known as: LONITEN       TAKE these medications    aspirin EC 81 MG tablet Take 1 tablet (81 mg total) by mouth daily. Swallow whole. Start taking on: April 02, 2022   atorvastatin 80 MG tablet Commonly known as: LIPITOR Take 1 tablet (80 mg total) by mouth daily. Start taking on: April 02, 2022   metoprolol succinate 25 MG 24 hr tablet Commonly known as: TOPROL-XL Take 1 tablet (25 mg total) by mouth daily. Start taking on: April 02, 2022   nitroGLYCERIN 0.4 MG SL tablet Commonly known as: Nitrostat Place 1 tablet (0.4 mg total) under the tongue every 5 (five) minutes as needed for chest pain (up to 3 doses. If taking 3rd dose call 911).   rivaroxaban 20 MG Tabs tablet Commonly known as: XARELTO Take 1 tablet (20 mg total) by mouth daily with supper.   spironolactone 25 MG tablet Commonly known as: ALDACTONE Take 1 tablet (25 mg total) by mouth daily. Start taking on: April 02, 2022           Outstanding Labs/Studies   None ordered but needs ongoing lab f/u for meds started above  Duration  of Discharge Encounter   Greater than 30 minutes including physician time.  Signed, Charlie Pitter, PA-C 04/01/2022, 1:26 PM

## 2022-04-01 NOTE — Telephone Encounter (Signed)
Still admitted. Will call once discharged.  Thanks!

## 2022-04-03 ENCOUNTER — Telehealth (HOSPITAL_COMMUNITY): Payer: Self-pay

## 2022-04-03 NOTE — Telephone Encounter (Signed)
Heart Failure Patient Advocate Encounter  Medication assistance forms for Xarelto have been started. Patient has signed forms.  Provider information will need to be completed before submitting.  Forms have been attached to patient chart under 'Media' tab. Routing to appropriate office.  Clista Bernhardt, CPhT Rx Patient Advocate Phone: [phone]

## 2022-04-03 NOTE — Telephone Encounter (Signed)
First attempt  to call for Follow up [facility] /discharge   Left message for patient to return call answer question for [facility]  Contact NL triage.  Appt schedule for 04/09/22 with [facility]   Dr Percival Spanish or Annamary Carolin  will need to sign his patient assistance  for Xarelto.  ( The pool has already had patient to sign his part of the form .)

## 2022-04-05 NOTE — Progress Notes (Deleted)
Cardiology Office Note:    Date:  04/05/2022   ID:  Chad Fitzgerald, DOB 1990-01-22, MRN [id number]  PCP:  Patient, No Pcp Per  Cardiologist:  Minus Breeding, MD  Electrophysiologist:  None   Referring MD: No ref. provider found   Chief Complaint: hospital follow-up of LV thrombus  History of Present Illness:    Chad Fitzgerald is a 32 y.o. 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 Percival Spanish and presents today for hospital follow up of LV thrombus.  Patient was admitted in 04/2014 at age 32 with an acute anterolateral STEMI after presenting with severe substernal chest pain and shortness of breath. LHC showed subtotally occluded proximal LAD with a large thrombus burden and LVEF of 25-30%. He underwent successful PCI with DES to LAD. Echo showed LVEF of 35-40% with akinesis of the anteroseptal and apical myocardium. He was admitted with recurrent chest pain in 12/2014. Echo showed LVEF of 35-40% with akinesis of the distal septal, distal inferior, and apical walls. LHC showed widely patent LAD stent and otherwise normal coronaries.   He was lost to follow-up after follow-up visit in 2018 and was not seen by Cardiology again until recent hospitalization. He was admitted from 03/28/2022 to 04/01/2022 after presenting with worsening palpitations and anxiety. He had been off all prescription medications for several years. EKG showed sinus tacycardia. High-sensitivity troponin negative x2. Head CT was normal aside from a chronic partially empty sella hich is often a normal anatomic variant but can be associated with idiopathic intracranial hypertension (pseudotumor cerebri). Chest CTA was negative for PE but remarkable for  a filling defect in the LV concerning for intraventricular thrombus. Echo showed LVEF of 40-45% with akinesis of the apical anteroseptal and inferoseptal walls as well as a large, ovoid apical LV thrombus. Cardiac MRI showed LVEF of 35% with regional wall motion abnormalities consistent with prior LAD infarct and non-viable appearing distal anterior wall septum and apex with viable mid/basal anterior wall and LCX/ RCA territory. There was 33.7% Gadolinium uptake in the LAD territory indicating a large area of infarction. Also showed a mobile thrombus measuring 3 x 1.5cm with a broad based stalk attaching to apex. Chad Fitzgerald was initially started on Coumadin but there was concern for medication non-compliance so he was switched to Xarelto. He was restarted on GDMT including Entresto but developed mild lip swelling with this felt to be consistent with mild angioedema so this was stopped and he was treated with Famotidine, Dexamethasone, and Benadryl with improvement. Close follow-up with Cardiology was arranged. He was also advised to follow-up with his PCP for questionable cirrhosis on CT, elevated hemoglobin levels, and partially empty sella noted on head CT.  Patient presents today for follow-up. ***  LV Thrombus Found to have LV thrombus on Echo and cardiac MRI during recent admission in 03/2022. He was initially started on Coumadin but was switched to Xarelto due to concern for medication non-compliance.  - *** - Continue Xarelto 20mg  daily. - Will repeat CBC. ***  Chronic HFrEF Ischemic Cardiomyopathy Echo during recent admission in 03/2022 showed LVEF of 40-45% with akinesis of the apical anteroseptal and inferoseptal walls. Cardiac MRI showed LVEF of 35% with regional wall motion abnormalities consistent with prior LAD infarct and non-viable appearing  distal anterior wall septum and apex with viable mid/basal anterior wall and LCX/ RCA territory. There was 33.7% Gadolinium uptake in the LAD territory  indicating a large area of infarction. - Euvolemic on exam.  - Continue Toprol-XL 25mg  daily.  - Continue Spironolactone 25mg  daily.  - SGLT2 inhibitor. *** - He had mild angioedema with Entresto so no ACEi, ARB, or ARNI. - Discussed importance of daily weights and sodium/ fluid restrictions.  - Will repeat BMET in 1 month. ***  CAD History of anterolateral MI in 04/2014 at age 53 s/p DES to LAD. Last LHC in 12/2014 showed patent stents and otherwise normal coronaries. - No chest pain. - Continue aspirin, beta-blocker, and high-intensity statin.   Hypertension BP well controlled. *** - Continue Toprol-XL 25mg  daily.   Hyperlipidemia Lipid panel on 03/29/2022 during recent admission: Total Cholesterol 177, Triglycerides 157, HDL 43, LDL 103. LDL goal <70.  - Restarted on Lipitor 80mg  daily during recent admission. Continue.  - Will repeat lipid panel and LFTs in 6-8 weeks.   Past Medical History:  Diagnosis Date   ADD (attention deficit disorder)    Anxiety    Asthma    as a child   CAD (coronary artery disease)    a. 04/28/14: acute anterolateral STEMI s/p DES to LAD   Gallstones    GERD (gastroesophageal reflux disease)    Gout    Hyperglycemia    a. 04/2014: Hg A1c 5.3   Hypertension    Ischemic cardiomyopathy    a. 04/29/13: 2D ECHO: LVEF 35-40%   Low serum HDL    MI (myocardial infarction) ([place])    Wears glasses    astigmatism    Past Surgical History:  Procedure Laterality Date   APPENDECTOMY     CARDIAC CATHETERIZATION N/A 12/24/2014   Procedure: Left Heart Cath and Coronary Angiography;  Surgeon: Jolaine Artist, MD;  Location: [facility];  Service: Cardiovascular;  Laterality: N/A;   CHOLECYSTECTOMY N/A 10/09/2016   Procedure: LAPAROSCOPIC CHOLECYSTECTOMY WITH INTRAOPERATIVE CHOLANGIOGRAM;  Surgeon: Greer Pickerel, MD;  Location: [facility];  Service: General;  Laterality: N/A;   LEFT HEART CATH N/A 04/28/2014   Procedure: LEFT HEART CATH;  Surgeon: Lorretta Harp, MD;  Location: [facility] CATH [facility];  Service: Cardiovascular;  Laterality: N/A;    Current Medications: No outpatient medications have been marked as taking for the 04/09/22 encounter (Appointment) with Darreld Mclean, PA-C.     Allergies:   Bee pollen and Entresto [sacubitril-valsartan]   Social History   Socioeconomic History   Marital status: Single    Spouse name: Not on file   Number of children: 0   Years of education: Not on file   Highest education level: Associate degree: academic program  Occupational History   Occupation: [employer]  Tobacco Use   Smoking status: Former    Types: Cigarettes    Quit date: 2011    Years since quitting: 13.2   Smokeless tobacco: Never  Vaping Use   Vaping Use: Never used  Substance and Sexual Activity   Alcohol use: Yes    Comment: occ   Drug use: Not Currently    Types: Marijuana    Comment: last year   Sexual activity: Not on file  Other Topics Concern   Not on file  Social History Narrative   Not on file   Social Determinants of Health   Financial Resource Strain: High Risk (03/31/2022)   Overall Financial Resource Strain (CARDIA)  Difficulty of Paying Living Expenses: Hard  Food Insecurity: No Food Insecurity (03/31/2022)   Hunger Vital Sign    Worried About Running Out of Food in the Last Year: Never true    Ran Out of Food in the Last Year: Never true  Transportation Needs: No Transportation Needs (03/31/2022)   PRAPARE - Hydrologist (Medical): No    Lack of Transportation (Non-Medical): No  Physical Activity: Not on file  Stress: Not on file  Social Connections: Not on file     Family History: The patient's family history includes Hypertension in his father. There is no history of Heart attack or Stroke.  ROS:   Please see the history of present illness.     EKGs/Labs/Other Studies Reviewed:    The following studies were reviewed:  Left Cardiac Catheterization  12/24/2014: Assessment: 1) LAD stent widely patent 2) Otherwise normal coronaries  3) LVEDP 3mm HG 4) ICM with EF 35-40% by echo    Plan/Discussion: His LAD stent is widely patent. Otherwise normal coronaries. Continue aggressive RF modification. Home today if post-cath course remains uncomplicated.  _______________  Echocardiogram 03/28/2022: Impressions: 1. Large, ovoid apical LV mural thrombus in region of akinesis involving  the apical anteroseptal/inferoseptal walls. Measures 2.3 x 1.6 cm and not  freely mobile. Visualized with and without Definity.   2. Left ventricular ejection fraction, by estimation, is 40 to 45%. The  left ventricle has mildly decreased function. The left ventricle  demonstrates regional wall motion abnormalities (see scoring  diagram/findings for description) consistent with  ischemic cardiomyopathy. Left ventricular diastolic parameters were  normal.   3. Right ventricular systolic function is normal. The right ventricular  size is normal. Tricuspid regurgitation signal is inadequate for assessing  PA pressure.   4. Left atrial size was mildly dilated.   5. The mitral valve is grossly normal. Trivial mitral valve  regurgitation.   6. The aortic valve is tricuspid. Aortic valve regurgitation is not  visualized.   7. The inferior vena cava is normal in size with greater than 50%  respiratory variability, suggesting right atrial pressure of 3 mmHg.   Comparison(s): Prior images unable to be directly viewed.  _______________  Cardiac MRI 03/30/2022: Impressions: 1. Moderate LVE with RWMA consistent with prior LAD infarct. Quantitative EF 35% 2. Non viable appearing distal anterior wall septum and apex with viable mid/basal anterior wall and LCX/RCA territory 3. 33.7% Gadolinium uptake in LAD territory infarct indicating large area of infarction 4.  Normal RV size and function RVEF 52% 5. Large mobile thrombus measuring 3 cm x 1.5 cm with broad  based stalk attaching to apex   EKG:  EKG not ordered today.   Recent Labs: 03/28/2022: B Natriuretic Peptide 12.5; TSH 1.624 03/31/2022: ALT 32; Magnesium 2.3 04/01/2022: BUN 15; Creatinine, Ser 1.16; Hemoglobin 18.7; Platelets 357; Potassium 4.6; Sodium 135  Recent Lipid Panel    Component Value Date/Time   CHOL 177 03/29/2022 0151   TRIG 157 (H) 03/29/2022 0151   HDL 43 03/29/2022 0151   CHOLHDL 4.1 03/29/2022 0151   VLDL 31 03/29/2022 0151   LDLCALC 103 (H) 03/29/2022 0151    Physical Exam:    Vital Signs: There were no vitals taken for this visit.    Wt Readings from Last 3 Encounters:  04/01/22 181 lb 6.4 oz (82.3 kg)  07/13/17 170 lb (77.1 kg)  07/11/17 170 lb (77.1 kg)     General: 32 y.o. male in no acute  distress. HEENT: Normocephalic and atraumatic. Sclera clear. EOMs intact. Neck: Supple. No carotid bruits. No JVD. Heart: *** RRR. Distinct S1 and S2. No murmurs, gallops, or rubs. Radial and distal pedal pulses 2+ and equal bilaterally. Lungs: No increased work of breathing. Clear to ausculation bilaterally. No wheezes, rhonchi, or rales.  Abdomen: Soft, non-distended, and non-tender to palpation. Bowel sounds present in all 4 quadrants.  MSK: Normal strength and tone for age. *** Extremities: No lower extremity edema.    Skin: Warm and dry. Neuro: Alert and oriented x3. No focal deficits. Psych: Normal affect. Responds appropriately.   Assessment:    No diagnosis found.  Plan:     Disposition: Follow up in ***   Medication Adjustments/Labs and Tests Ordered: Current medicines are reviewed at length with the patient today.  Concerns regarding medicines are outlined above.  No orders of the defined types were placed in this encounter.  No orders of the defined types were placed in this encounter.   There are no Patient Instructions on file for this visit.   Liane Comber, PA-C  04/05/2022 9:31 PM    [place]

## 2022-04-07 NOTE — Telephone Encounter (Signed)
2nd attempt TOC call.  Left message on personal voice mail to call back

## 2022-04-09 ENCOUNTER — Ambulatory Visit: Payer: Self-pay | Admitting: Student

## 2022-04-10 NOTE — Telephone Encounter (Signed)
Encounter closed . Patient called and cancelled [facility] appointment- only spoke to scheduling team. Patient  has an appointment 04/20/22 - heart failure clinic

## 2022-04-14 NOTE — Telephone Encounter (Signed)
Application placed in folder for hochrein to sign.

## 2022-04-16 ENCOUNTER — Inpatient Hospital Stay (INDEPENDENT_AMBULATORY_CARE_PROVIDER_SITE_OTHER): Payer: Self-pay | Admitting: Primary Care

## 2022-04-20 ENCOUNTER — Ambulatory Visit (HOSPITAL_COMMUNITY)
Admit: 2022-04-20 | Discharge: 2022-04-20 | Disposition: A | Discharge status: Home / Self Care | Payer: Self-pay | Attending: Adult Health | Admitting: Adult Health

## 2022-04-20 ENCOUNTER — Other Ambulatory Visit: Payer: Self-pay

## 2022-04-20 ENCOUNTER — Telehealth (HOSPITAL_COMMUNITY): Payer: Self-pay | Admitting: Licensed Clinical Social Worker

## 2022-04-20 ENCOUNTER — Other Ambulatory Visit (HOSPITAL_COMMUNITY): Payer: Self-pay

## 2022-04-20 ENCOUNTER — Encounter (HOSPITAL_COMMUNITY): Payer: Self-pay

## 2022-04-20 VITALS — BP 150/110 | HR 86 | Wt 176.6 lb

## 2022-04-20 DIAGNOSIS — I255 Ischemic cardiomyopathy: Secondary | ICD-10-CM | POA: Insufficient documentation

## 2022-04-20 DIAGNOSIS — Z7982 Long term (current) use of aspirin: Secondary | ICD-10-CM | POA: Insufficient documentation

## 2022-04-20 DIAGNOSIS — I110 Hypertensive heart disease with heart failure: Secondary | ICD-10-CM | POA: Insufficient documentation

## 2022-04-20 DIAGNOSIS — Z7901 Long term (current) use of anticoagulants: Secondary | ICD-10-CM | POA: Insufficient documentation

## 2022-04-20 DIAGNOSIS — Z79899 Other long term (current) drug therapy: Secondary | ICD-10-CM | POA: Insufficient documentation

## 2022-04-20 DIAGNOSIS — I5022 Chronic systolic (congestive) heart failure: Secondary | ICD-10-CM | POA: Insufficient documentation

## 2022-04-20 DIAGNOSIS — Z9861 Coronary angioplasty status: Secondary | ICD-10-CM

## 2022-04-20 DIAGNOSIS — I10 Essential (primary) hypertension: Secondary | ICD-10-CM

## 2022-04-20 DIAGNOSIS — R072 Precordial pain: Secondary | ICD-10-CM | POA: Insufficient documentation

## 2022-04-20 DIAGNOSIS — I2510 Atherosclerotic heart disease of native coronary artery without angina pectoris: Secondary | ICD-10-CM | POA: Insufficient documentation

## 2022-04-20 DIAGNOSIS — I513 Intracardiac thrombosis, not elsewhere classified: Secondary | ICD-10-CM

## 2022-04-20 LAB — BASIC METABOLIC PANEL
Anion gap: 8 (ref 5–15)
BUN: 12 mg/dL (ref 6–20)
CO2: 25 mmol/L (ref 22–32)
Calcium: 9.7 mg/dL (ref 8.9–10.3)
Chloride: 104 mmol/L (ref 98–111)
Creatinine, Ser: 1.01 mg/dL (ref 0.61–1.24)
GFR, Estimated: >60 mL/min (ref >60)
Glucose, Bld: 99 mg/dL (ref 70–99)
Potassium: 4.2 mmol/L (ref 3.5–5.1)
Sodium: 137 mmol/L (ref 135–145)

## 2022-04-20 MED ORDER — AMLODIPINE BESYLATE 2.5 MG PO TABS
2.5000 mg | ORAL_TABLET | Freq: Every day | ORAL | 3 refills | Status: AC
  Filled 2022-04-20: qty 30, 30d supply, fill #0
  Filled 2022-09-11: qty 30, 30d supply, fill #1
  Filled 2022-11-09: qty 30, 30d supply, fill #2

## 2022-04-20 MED ORDER — METOPROLOL SUCCINATE ER 25 MG PO TB24
25.0000 mg | ORAL_TABLET | Freq: Every day | ORAL | 5 refills | Status: AC
  Filled 2022-04-20 – 2022-08-11 (×2): qty 30, 30d supply, fill #0
  Filled 2022-11-09: qty 30, 30d supply, fill #1

## 2022-04-20 NOTE — Progress Notes (Signed)
HEART & VASCULAR TRANSITION OF CARE CONSULT NOTE     Referring Physician:Dr Hochrein Primary Care:None  Primary Cardiologist: Dr Antoine Poche  HPI: Referred to clinic by heart failure for heart failure consultation.   Mr Chad Fitzgerald is a 32 year old with a history of LV thrombus, CAD, DES LAD, ICM, ADD, and chronic HFrEF.  Also has history of angioedema with entresto.   Cath 2016 with DES to LAD. Lost to cardiology follow up in 2018.   Admitted 03/28/22 with palpitations. CTA negative for PE but showed LV thrombus. Initially on heparin drip but later transitioned to xarelto. Angioedema with entresto initiation. Placed on metoprolol and spironolactone. Discharged on 04/01/22.   Overall feels great. Denies SOB/PND/Orthopnea. SBP at home 120s. No bleeding issues. Appetite ok. Following low salt diet. Tries to avoid fast food. No fever or chills. Weight at home 176 pounds. Taking all medications but usually takes late in the afternoon.He has been exercising and cycling on a stationary bike. He thinks metoprolol causes dizziness and brief chest discomfort.    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 He is uninsured. Lives with his Mom. Has difficulty paying for medications.   Cardiac Testing  Echo 03/28/22   1. Large, ovoid apical LV mural thrombus in region of akinesis involving  the apical anteroseptal/inferoseptal walls. Measures 2.3 x 1.6 cm and not  freely mobile. Visualized with and without Definity.   2. Left ventricular ejection fraction, by estimation, is 40 to 45%. The  left ventricle has mildly decreased function. The left ventricle  demonstrates regional wall motion abnormalities (see scoring  diagram/findings for description) consistent with  ischemic cardiomyopathy. Left ventricular diastolic parameters were  normal.   3. Right ventricular systolic function is normal. The right ventricular  size is normal. Tricuspid regurgitation  signal is inadequate for assessing  PA pressure.   4. Left atrial size was mildly dilated.   5. The mitral valve is grossly normal. Trivial mitral valve  regurgitation.   6. The aortic valve is tricuspid. Aortic valve regurgitation is not  visualized.   CMRI 03/30/22  1. Moderate LVE with RWMA consistent with prior LAD infarct. Quantitative EF 35% 2. Non viable appearing distal anterior wall septum and apex with viable mid/basal anterior wall and LCX/RCA territory  3. 33.7% Gadolinium uptake in LAD territory infarct indicating large area of infarction  4.  Normal RV size and function RVEF 52%  5. Large mobile thrombus measuring 3 cm x 1.5 cm with broad based stalk attaching to apex  2016 LHC  1) LAD stent widely patent 2) Otherwise normal coronaries  3) LVEDP 5mm HG 4) ICM with EF 35-40% by echo  Review of Systems: [y] = yes,  = no   General: Weight gain ; Weight loss ; Anorexia ; Fatigue ; Fever ; Chills ; Weakness   Cardiac: Chest pain/pressure ; Resting SOB ; Exertional SOB ; Orthopnea ; Pedal Edema ; Palpitations ; Syncope ; Presyncope ; Paroxysmal nocturnal dyspnea[ ]   Pulmonary: Cough ; Wheezing[ ] ; Hemoptysis[ ] ; Sputum ; Snoring   GI: Vomiting[ ] ; Dysphagia[ ] ; Melena[ ] ; Hematochezia ; Heartburn[ ] ; Abdominal pain ; Constipation ; Diarrhea ; BRBPR   GU: Hematuria[ ] ; Dysuria ; Nocturia[ ]   Vascular: Pain in legs with walking ; Pain in  feet with lying flat [ ] ; Non-healing sores [ ] ; Stroke [ ] ; TIA [ ] ; Slurred speech [ ] ;  Neuro: Headaches[ ] ; Vertigo[ ] ; Seizures[ ] ; Paresthesias[ ] ;Blurred vision [ ] ; Diplopia [ ] ; Vision changes [ ]   Ortho/Skin: Arthritis [ ] ; Joint pain [ ] ; Muscle pain [ ] ; Joint swelling [ ] ; Back Pain [ Y]; Rash [ ]   Psych: Depression[ ] ; Anxiety[ ]   Heme: Bleeding problems [ ] ; Clotting disorders [ ] ; Anemia [ ]   Endocrine: Diabetes [ ] ; Thyroid dysfunction[ ]    Past  Medical History:  Diagnosis Date   ADD (attention deficit disorder)    Anxiety    Asthma    as a child   CAD (coronary artery disease)    a. 04/28/14: acute anterolateral STEMI s/p DES to LAD   Gallstones    GERD (gastroesophageal reflux disease)    Gout    Hyperglycemia    a. 04/2014: Hg A1c 5.3   Hypertension    Ischemic cardiomyopathy    a. 04/29/13: 2D ECHO: LVEF 35-40%   Low serum HDL    MI (myocardial infarction)    Wears glasses    astigmatism    Current Outpatient Medications  Medication Sig Dispense Refill   aspirin EC 81 MG tablet Take 1 tablet (81 mg total) by mouth daily. Swallow whole. 30 tablet 11   atorvastatin (LIPITOR) 80 MG tablet Take 1 tablet (80 mg total) by mouth daily. 30 tablet 5   metoprolol succinate (TOPROL-XL) 25 MG 24 hr tablet Take 1 tablet (25 mg total) by mouth daily. 30 tablet 5   nitroGLYCERIN (NITROSTAT) 0.4 MG SL tablet Place 1 tablet (0.4 mg total) under the tongue every 5 (five) minutes as needed for chest pain (up to 3 doses. If taking 3rd dose call 911). 25 tablet 3   rivaroxaban (XARELTO) 20 MG TABS tablet Take 1 tablet (20 mg total) by mouth daily with supper. 30 tablet 5   spironolactone (ALDACTONE) 25 MG tablet Take 1 tablet (25 mg total) by mouth daily. 30 tablet 5   No current facility-administered medications for this encounter.    Allergies  Allergen Reactions   Bee Pollen Hives, Itching and Rash   Entresto [Sacubitril-Valsartan] Swelling    Angioedema of lips during 03/2022 hospitalization      Social History   Socioeconomic History   Marital status: Single    Spouse name: Not on file   Number of children: 0   Years of education: Not on file   Highest education level: Associate degree: academic program  Occupational History   Occupation: [employer]  Tobacco Use   Smoking status: Former    Types: Cigarettes    Quit date: 2011    Years since quitting: 13.2   Smokeless tobacco: Never  Vaping Use   Vaping Use: Never  used  Substance and Sexual Activity   Alcohol use: Yes    Comment: occ   Drug use: Not Currently    Types: Marijuana    Comment: last year   Sexual activity: Not on file  Other Topics Concern   Not on file  Social History Narrative   Not on file   Social Determinants of Health   Financial Resource Strain: High Risk (03/31/2022)   Overall Financial Resource Strain (CARDIA)    Difficulty of Paying Living Expenses: Hard  Food Insecurity: No Food Insecurity (03/31/2022)   Hunger Vital Sign    Worried About Running Out of Food in the Last Year:  Never true    Ran Out of Food in the Last Year: Never true  Transportation Needs: No Transportation Needs (03/31/2022)   PRAPARE - Administrator, Civil Service (Medical): No    Lack of Transportation (Non-Medical): No  Physical Activity: Not on file  Stress: Not on file  Social Connections: Not on file  Intimate Partner Violence: Not on file      Family History  Problem Relation Age of Onset   Hypertension Father    Heart attack Neg Hx    Stroke Neg Hx     Vitals:   04/20/22 0929  BP: (!) 150/110  Pulse: 86  SpO2: 96%  Weight: 80.1 kg (176 lb 9.6 oz)   Wt Readings from Last 3 Encounters:  04/20/22 80.1 kg (176 lb 9.6 oz)  04/01/22 82.3 kg (181 lb 6.4 oz)  07/13/17 77.1 kg (170 lb)    PHYSICAL EXAM: General:  Well appearing. No respiratory difficulty HEENT: normal Neck: supple. no JVD. Carotids 2+ bilat; no bruits. No lymphadenopathy or thryomegaly appreciated. Cor: PMI nondisplaced. Regular rate & rhythm. No rubs, gallops or murmurs. Lungs: clear Abdomen: soft, nontender, nondistended. No hepatosplenomegaly. No bruits or masses. Good bowel sounds. Extremities: no cyanosis, clubbing, rash, edema Neuro: alert & oriented x 3, cranial nerves grossly intact. moves all 4 extremities w/o difficulty. Affect pleasant.  ECG: SR 76 bpm personally check    ASSESSMENT & PLAN: 1. Chronic HFrEF, ICM -Echo EF 40-45% RV  normal , LV thrombus NYHA I. Overall seems to be doing well.  GDMT  Diuretic- Volume status stable.  BB- Switch Toprol XL to bed time. Hopefully this will help with tolerance. If not can consider coreg.   Ace/ARB/ARNI- no ARNi with angioedema from entresto  MRA- Continue spironolactone 25 mg daily.  SGLT2i- Consider next visit.  Check BMET .Discussed medications.   2. CAD  -DES LAD patent 2016  - No chest pain. On aspirin, statin, and bb.   3. LV Thrombus - Echo + CMRI -On Xarelto -Medication assistance programs. Will f/u with pharmacy team.  - No bleeding issues.   4. HTN  -Elevated. Continue current regimen and add amlodipine 2.5 mg daily.  -Asked him to record BP. Discussed medication changes.      Referred to HFSW - Yes for PCP, Medications,  Insurance, and Surveyor, quantity.  Refer to Pharmacy: Yes  Refer to Home Health:  No Refer to Advanced Heart Failure Clinic:  no  Refer to General Cardiology: Dr Antoine Poche  Follow up in 2 weeks and will also ask pharmacy to see him. Stressed medication compliance.   Ronaldo Crilly NP-C  9:37 AM

## 2022-04-20 NOTE — Progress Notes (Signed)
  Heart and Vascular Care Navigation  04/20/2022  Chad Fitzgerald 1990-11-14 [id number]  Reason for Referral:  Patient is participating in a Managed Medicaid Plan: NO self pay  Engaged with patient face to face for initial visit for Heart and Vascular Care Coordination.                                                                                                   Assessment:    Patient is a 32 yo male who has had cardiac concerns in the past and reports he had a stent placed in 2016. He reports recent hospitalization and is here in HF TOC for follow up. He states he had a bad headache and took his BP and it was in the 200's. He works part time for a [employer] and lives with his mother. He states he has no insurance and completed a CAFA application here at [employer]. He also reports no PCP. Patient denies any other SDoH needs at this time.    HRT/VAS Care Coordination     Patients Home Cardiology Office Heart Failure Clinic  HF [facility]   Outpatient Care Team Social Worker   Social Worker Name: [name], [place] [phone]   Living arrangements for the past 2 months Single Family Home   Lives with: Parents  Mother   Patient Current Insurance Coverage Self-Pay   Patient Has Concern With Paying Medical Bills Yes   Medical Bill Referrals: CAFA application   Does Patient Have Prescription Coverage? No   Home Assistive Devices/Equipment None   DME Agency NA   HH Agency NA       Social History:                                                                             SDOH Screenings   Food Insecurity: No Food Insecurity (03/31/2022)  Housing: Low Risk  (03/31/2022)  Transportation Needs: No Transportation Needs (03/31/2022)  Utilities: Not At Risk (04/20/2022)  Alcohol Screen: Low Risk  (03/31/2022)  Financial Resource Strain: High Risk (03/31/2022)  Tobacco Use: Medium Risk (04/20/2022)    SDOH Interventions: Financial Resources:    Editor, commissioning for [employer] Program  Food Insecurity:     Housing Insecurity:     Transportation:       Follow-up plan: CSW will follow up on CAFA and assist with medications as needed. CSW will also obtain a PCP appointment within [facility] and contact patient with appointment time. Patient grateful for the support and assistance and will retrun to [facility] for follow up in 2 weeks. [name], LCSW, CCSW-MCS [phone]

## 2022-04-20 NOTE — Telephone Encounter (Signed)
CSW received return appointment time for PCP appointment at [facility] for 05-20-22 at 10:50 am. CSW attempted to reach patient to inform of appointment with message left. [name], LCSW, CCSW-MCS [phone]

## 2022-04-20 NOTE — Patient Instructions (Addendum)
Take your Metoprolol at bedtime. Start Amlodipine (Norvasc) 2.5 mg daily. Rx sent to your local pharmacy. Return to Heart Failure TOC Clinic in 2 weeks.  Please call Heart Failure Clinic at [phone] if any questions or concerns before your next appointment.

## 2022-04-21 ENCOUNTER — Telehealth (HOSPITAL_COMMUNITY): Payer: Self-pay | Admitting: Licensed Clinical Social Worker

## 2022-04-21 ENCOUNTER — Other Ambulatory Visit (HOSPITAL_COMMUNITY): Payer: Self-pay

## 2022-04-21 ENCOUNTER — Other Ambulatory Visit: Payer: Self-pay

## 2022-04-21 NOTE — Telephone Encounter (Signed)
CSW attempted again to reach patient to provide PCP appointment scheduled at [facility] on 05-20-22 at 10:50am. CSW left message for return call. [name], LCSW, CCSW-MCS [phone]

## 2022-04-22 ENCOUNTER — Other Ambulatory Visit: Payer: Self-pay

## 2022-04-24 NOTE — Telephone Encounter (Signed)
Per fax from [employer], patient has been approved for assistance for xarelto.

## 2022-04-30 ENCOUNTER — Other Ambulatory Visit: Payer: Self-pay

## 2022-04-30 ENCOUNTER — Other Ambulatory Visit (HOSPITAL_COMMUNITY): Payer: Self-pay

## 2022-05-01 ENCOUNTER — Telehealth (HOSPITAL_COMMUNITY): Payer: Self-pay

## 2022-05-01 ENCOUNTER — Other Ambulatory Visit: Payer: Self-pay

## 2022-05-01 ENCOUNTER — Other Ambulatory Visit (HOSPITAL_COMMUNITY): Payer: Self-pay

## 2022-05-01 NOTE — Telephone Encounter (Signed)
Advanced Heart Failure Patient Advocate Encounter  Returned patient call, he was approved through [employer] on 4/19 but has not yet received medication. This usually takes 7-10 days, and patient does not have enough medication to get through the weekend. I advised him that the delivery should arrive by Monday 04/29 but if it does not he will need to contact [employer] for tracking information. Providing 2 weeks of samples to cover any potential delay.  Medication Samples have been left at registration desk for patient pick up. Drug name: Xarelto 20MG  Qty: 2x 7 ct package LOT: [id number] Exp.: 10/2022 SIG: Take 1 tablet by mouth once daily   The patient has been instructed regarding the correct time, dose, and frequency of taking this medication, including desired effects and most common side effects.   Burnell Blanks, CPhT Rx Patient Advocate Phone: [phone]

## 2022-05-06 ENCOUNTER — Ambulatory Visit (HOSPITAL_COMMUNITY): Payer: Self-pay

## 2022-05-13 ENCOUNTER — Other Ambulatory Visit (HOSPITAL_BASED_OUTPATIENT_CLINIC_OR_DEPARTMENT_OTHER): Payer: Self-pay

## 2022-05-13 ENCOUNTER — Other Ambulatory Visit (HOSPITAL_COMMUNITY): Payer: Self-pay

## 2022-05-13 ENCOUNTER — Other Ambulatory Visit: Payer: Self-pay

## 2022-05-20 ENCOUNTER — Ambulatory Visit (INDEPENDENT_AMBULATORY_CARE_PROVIDER_SITE_OTHER): Payer: Self-pay | Admitting: Primary Care

## 2022-06-12 ENCOUNTER — Other Ambulatory Visit: Payer: Self-pay

## 2022-07-02 ENCOUNTER — Other Ambulatory Visit: Payer: Self-pay

## 2022-07-13 ENCOUNTER — Other Ambulatory Visit: Payer: Self-pay

## 2022-08-11 ENCOUNTER — Other Ambulatory Visit: Payer: Self-pay

## 2022-09-11 ENCOUNTER — Other Ambulatory Visit: Payer: Self-pay

## 2022-10-05 ENCOUNTER — Other Ambulatory Visit: Payer: Self-pay | Admitting: Physician Assistant

## 2022-10-05 ENCOUNTER — Other Ambulatory Visit: Payer: Self-pay

## 2022-10-06 ENCOUNTER — Other Ambulatory Visit: Payer: Self-pay

## 2022-10-06 MED ORDER — RIVAROXABAN 20 MG PO TABS
20.0000 mg | ORAL_TABLET | Freq: Every day | ORAL | 5 refills | Status: DC
  Filled 2022-10-06: qty 30, 30d supply, fill #0
  Filled 2022-11-09: qty 30, 30d supply, fill #1
  Filled 2022-12-09: qty 30, 30d supply, fill #2
  Filled 2023-01-04: qty 30, 30d supply, fill #3
  Filled 2023-02-15: qty 30, 30d supply, fill #4
  Filled 2023-03-19: qty 30, 30d supply, fill #5

## 2022-10-06 NOTE — Telephone Encounter (Signed)
Prescription refill request for Xarelto received.  Indication:LV Thrombus Last office visit:4/24 Weight:80.1  kg Age:32 Scr:1.01  4/24 CrCl:117.86  ml/min  Prescription refilled

## 2022-11-09 ENCOUNTER — Other Ambulatory Visit: Payer: Self-pay

## 2022-12-09 ENCOUNTER — Other Ambulatory Visit: Payer: Self-pay

## 2023-01-04 ENCOUNTER — Other Ambulatory Visit: Payer: Self-pay

## 2023-02-10 ENCOUNTER — Telehealth: Payer: Self-pay

## 2023-02-10 NOTE — Telephone Encounter (Signed)
 Received a call from a representative at [employer] patient medication assistance program requesting additional information be faxed to them. Quantity changed to 90 and ICD code changed to I25.10 and Z98.61. This has been faxed as requested and confirmation sheet saved.

## 2023-02-15 ENCOUNTER — Other Ambulatory Visit: Payer: Self-pay

## 2023-03-10 ENCOUNTER — Telehealth: Payer: Self-pay

## 2023-03-10 NOTE — Telephone Encounter (Signed)
 Called patient to discuss the updated patient assistance forms that I sent to [employer] to his zarelto. He states he has not heard anything back from them either. Gave him the phone number [phone] to have him call them directly for an update of if they have made a decision yet. Pt voiced understanding of plan.

## 2023-03-19 ENCOUNTER — Other Ambulatory Visit: Payer: Self-pay

## 2023-04-08 ENCOUNTER — Other Ambulatory Visit: Payer: Self-pay

## 2023-04-08 ENCOUNTER — Telehealth: Payer: Self-pay | Admitting: Pharmacist

## 2023-04-08 ENCOUNTER — Other Ambulatory Visit: Payer: Self-pay | Admitting: Physician Assistant

## 2023-04-08 ENCOUNTER — Telehealth: Payer: Self-pay | Admitting: Licensed Clinical Social Worker

## 2023-04-08 MED ORDER — RIVAROXABAN 20 MG PO TABS
20.0000 mg | ORAL_TABLET | Freq: Every day | ORAL | 0 refills | Status: DC
  Filled 2023-04-08 – 2023-04-12 (×2): qty 30, 30d supply, fill #0

## 2023-04-08 NOTE — Telephone Encounter (Signed)
 H&V Care Navigation CSW Progress Note  Clinical Social Worker contacted patient by phone to f/u on referral regarding pt self pay status. Appears pt previously approved for CAFA through 10/2022. May not qualify for Medicaid. Was approved for Xarelto assistance with HF team at [facility] appt in 2024- new app sent in by Corinda, RN, no updates. Note pt should have f/u appt scheduled. Per pharmacy team pt was routed to scheduling and I will also f/u with him again regarding missed appts, self pay status and reminders for Xarelto f/u.   Patient is participating in a Managed Medicaid Plan:  No self pay only  SDOH Screenings   Food Insecurity: No Food Insecurity (03/31/2022)  Housing: Low Risk  (03/31/2022)  Transportation Needs: No Transportation Needs (03/31/2022)  Utilities: Not At Risk (04/20/2022)  Alcohol Screen: Low Risk  (03/31/2022)  Financial Resource Strain: High Risk (03/31/2022)  Tobacco Use: Medium Risk (04/20/2022)    Chad Fitzgerald, MSW, LCSW Clinical Social Worker II [facility] Heart/Vascular Care Navigation  [phone]- work cell phone (preferred) [phone]- desk phone

## 2023-04-08 NOTE — Telephone Encounter (Signed)
 Prescription refill request for Xarelto received.  Indication: lv thrombus  Last office visit: Needs appointment with cardiologist.  Weight: 80.1 kg  Age:33 yo  Scr: 1.01, 04/20/2022 CrCl: 117 ml/min   Msg sent to schedulers.

## 2023-04-08 NOTE — Telephone Encounter (Signed)
 Had a refill request for this patient but I saw he did not have coverage. Routing in LCSW

## 2023-04-09 ENCOUNTER — Telehealth: Payer: Self-pay | Admitting: Licensed Clinical Social Worker

## 2023-04-09 NOTE — Telephone Encounter (Signed)
 H&V Care Navigation CSW Progress Note  Clinical Social Worker contacted patient by phone to f/u on referral regarding self pay status/no PCP. No answer again today at [phone]. Will re-attempt again as able.   Patient is participating in a Managed Medicaid Plan:  No, self pay only  SDOH Screenings   Food Insecurity: No Food Insecurity (03/31/2022)  Housing: Low Risk  (03/31/2022)  Transportation Needs: No Transportation Needs (03/31/2022)  Utilities: Not At Risk (04/20/2022)  Alcohol Screen: Low Risk  (03/31/2022)  Financial Resource Strain: High Risk (03/31/2022)  Tobacco Use: Medium Risk (04/20/2022)    Chad Fitzgerald, MSW, LCSW Clinical Social Worker II [facility] Heart/Vascular Care Navigation  [phone]- work cell phone (preferred) [phone]- desk phone

## 2023-04-12 ENCOUNTER — Other Ambulatory Visit: Payer: Self-pay

## 2023-04-12 ENCOUNTER — Telehealth: Payer: Self-pay | Admitting: Student

## 2023-04-12 MED ORDER — RIVAROXABAN 20 MG PO TABS
20.0000 mg | ORAL_TABLET | Freq: Every day | ORAL | 1 refills | Status: AC
  Filled 2023-04-12: qty 180, 180d supply, fill #0
  Filled 2023-04-12: qty 30, 30d supply, fill #0
  Filled 2023-05-19: qty 30, 30d supply, fill #1
  Filled 2023-06-18: qty 30, 30d supply, fill #2
  Filled 2023-07-13: qty 30, 30d supply, fill #3
  Filled 2023-08-20: qty 30, 30d supply, fill #4
  Filled 2023-09-17: qty 30, 30d supply, fill #5
  Filled 2023-10-05: qty 30, 30d supply, fill #6
  Filled 2023-11-12 – 2023-11-26 (×7): qty 30, 30d supply, fill #7

## 2023-04-12 NOTE — Telephone Encounter (Signed)
 Prescription refill request for Xarelto received.  Indication: LV Thrombus Last office visit: 04/20/22  Weight: 80.1kg Age: 33 Scr: 1.01 (04/20/22)  CrCl: 116.34ml/min  Appropriate dose. Refill sent.

## 2023-04-12 NOTE — Telephone Encounter (Signed)
*  STAT* If patient is at the pharmacy, call can be transferred to refill team.   1. Which medications need to be refilled? (please list name of each medication and dose if known) rivaroxaban (XARELTO) 20 MG TABS tablet   2. Which pharmacy/location (including street and city if local pharmacy) is medication to be sent to?  [facility]      3. Do they need a 30 day or 90 day supply? 90 day    Pt is out of medication.

## 2023-04-13 ENCOUNTER — Other Ambulatory Visit: Payer: Self-pay

## 2023-04-13 ENCOUNTER — Telehealth: Payer: Self-pay | Admitting: Licensed Clinical Social Worker

## 2023-04-13 NOTE — Telephone Encounter (Signed)
 H&V Care Navigation CSW Progress Note  Clinical Social Worker received a call back from pt [phone]). Confirmed home address, resides with mother. Still employed, no financial challenges noted with housing, utilities, food, transportation at this time. He shares that he was able to enroll in [employer] through Healthcare.Heritage manager and has Animal nutritionist. His Xarelto is $15 at the pharmacy- he has no concerns paying this; discussed commercial insurance makes him ineligible for [employer] Assistance program. Will re-send him copy of PCP list to ensure provider is in network and he was encouraged to schedule himself an appt since previous appt he no showed. Confirmed  he has [facility] with [facility].  No additional questions/concerns at this time. Encouraged him to call us if any issues arise.   Patient is participating in a Managed Medicaid Plan:  No, [employer] Museum/gallery exhibitions officer only  SDOH Screenings   Food Insecurity: No Food Insecurity (04/13/2023)  Housing: Low Risk  (04/13/2023)  Transportation Needs: No Transportation Needs (04/13/2023)  Utilities: Not At Risk (04/13/2023)  Alcohol Screen: Low Risk  (03/31/2022)  Financial Resource Strain: Medium Risk (04/13/2023)  Tobacco Use: Medium Risk (04/20/2022)  Health Literacy: Adequate Health Literacy (04/13/2023)   Octavio Graves, MSW, LCSW Clinical Social Worker II [facility] Heart/Vascular Care Navigation  [phone]- work cell phone (preferred) [phone]- desk phone

## 2023-05-13 NOTE — Progress Notes (Deleted)
 Cardiology Office Note:    Date:  05/13/2023   ID:  Chad Fitzgerald, DOB 1990-04-12, MRN [id number]  PCP:  Patient, No Pcp Per  Cardiologist:  Eilleen Grates, MD { Click to update primary MD,subspecialty MD or APP then REFRESH:1}    Referring MD: No ref. provider found   Chief Complaint: follow-up of CAD, CHF, and LV thrombus  History of Present Illness:    Chad Fitzgerald is a 33 y.o. 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 Lavonne Prairie and presents today for routine follow-up.   Patient was admitted in 04/2014 at age 56 with an acute anterolateral STEMI after presenting with severe substernal chest pain and shortness of breath. LHC showed subtotally occluded proximal LAD with a large thrombus burden and LVEF of 25-30%. He underwent successful PCI with DES to LAD. Echo showed LVEF of 35-40% with akinesis of the anteroseptal and apical myocardium. He was admitted with recurrent chest pain in 12/2014. Echo showed LVEF of 35-40% with akinesis of the distal septal, distal inferior, and apical walls. LHC showed widely patent LAD stent and otherwise normal coronaries.   He was lost to follow-up after visit in 2018 and was not seen by Cardiology until hospitalization in 03/2022. He was admitted at that time after presenting with worsening palpitations and anxiety. He had been off all prescription medications for several years. EKG showed sinus tacycardia. High-sensitivity troponin negative x2. Head CT was normal aside from a chronic partially empty sella hich is often a normal anatomic variant but can be associated with idiopathic intracranial hypertension (pseudotumor cerebri). Chest CTA was negative for PE but remarkable for a  filling defect in the LV concerning for intraventricular thrombus. Echo showed LVEF of 40-45% with akinesis of the apical anteroseptal and inferoseptal walls as well as a large, ovoid apical LV thrombus. Cardiac MRI showed LVEF of 35% with regional wall motion abnormalities consistent with prior LAD infarct and non-viable appearing distal anterior wall septum and apex with viable mid/basal anterior wall and LCX/ RCA territory. There was 33.7% Gadolinium uptake in the LAD territory indicating a large area of infarction. Also showed a mobile thrombus measuring 3 x 1.5cm with a broad based stalk attaching to apex. He was initially started on Coumadin  but there was concern for medication non-compliance so he was switched to Xarelto . He was restarted on GDMT including Entresto  but developed mild lip swelling with this felt to be consistent with mild angioedema so this was stopped and he was treated with Famotidine , Dexamethasone , and Benadryl  with improvement. Close follow-up with Cardiology was arranged. He was also advised to follow-up with his PCP for questionable cirrhosis on CT, elevated hemoglobin levels, and partially empty sella noted on head CT.  He was seen last seen in the [facility] on 04/20/2022 for follow-up at which time he was doing well overall but felt like the Toprol -XL was causing him dizziness and brief chest discomfort. He was advised to start taking this at night. He was advised to follow-up in 2 weeks but was lost to follow-up.   Patient presents today for follow-up. ***  CAD History of anterolateral MI in 04/2014 at age 62 s/p DES to LAD. Last LHC in 12/2014 showed patent stents and otherwise normal coronaries. -  No chest pain. - Continue aspirin , beta-blocker, and high-intensity statin.   Chronic HFrEF Ischemic Cardiomyopathy Echo in 03/2022 showed LVEF of 40-45% with akinesis of the apical anteroseptal and inferoseptal walls. Cardiac MRI showed LVEF of 35% with regional  wall motion abnormalities consistent with prior LAD infarct and non-viable appearing distal anterior wall septum and apex with viable mid/basal anterior wall and LCX/ RCA territory. There was 33.7% Gadolinium uptake in the LAD territory indicating a large area of infarction. - Euvolemic on exam.  - Continue Toprol -XL 25mg  daily.  - Continue Spironolactone  25mg  daily.  - SGLT2 inhibitor. *** - He had mild angioedema with Entresto  so no ACEi, ARB, or ARNI. - Discussed importance of daily weights and sodium/ fluid restrictions.  - Will repeat BMET in 1 month. ***  LV Thrombus Found to have LV thrombus on Echo and cardiac MRI during admission in 03/2022. He was initially started on Coumadin  but was switched to Xarelto  due to concern for medication non-compliance.  - Continue Xarelto  20mg  daily. - Will repeat Echo. *** - Will repeat CBC. ***  Hypertension BP well controlled. *** - Continue current medications: Amlodipine  2.5mg  daily, Toprol -XL 25mg  daily, and Spironolactone  25mg  daily.   Hyperlipidemia Lipid panel in 03/2022 during recent admission: Total Cholesterol 177, Triglycerides 157, HDL 43, LDL 103. LDL goal <70.  - Continue Lipitor  80mg  daily.  - Will repeat lipid panel and LFTs. ***  EKGs/Labs/Other Studies Reviewed:    The following studies were reviewed:  Left Cardiac Catheterization 12/24/2014: Assessment: 1) LAD stent widely patent 2) Otherwise normal coronaries  3) LVEDP 5mm HG 4) ICM with EF 35-40% by echo    Plan/Discussion: His LAD stent is widely patent. Otherwise normal coronaries. Continue aggressive RF modification. Home today if post-cath course remains uncomplicated.  _______________   Echocardiogram 03/28/2022: Impressions: 1. Large, ovoid apical LV mural thrombus in region of akinesis involving  the apical anteroseptal/inferoseptal walls. Measures 2.3 x 1.6 cm and not  freely mobile. Visualized with and without Definity .   2. Left ventricular ejection  fraction, by estimation, is 40 to 45%. The  left ventricle has mildly decreased function. The left ventricle  demonstrates regional wall motion abnormalities (see scoring  diagram/findings for description) consistent with  ischemic cardiomyopathy. Left ventricular diastolic parameters were  normal.   3. Right ventricular systolic function is normal. The right ventricular  size is normal. Tricuspid regurgitation signal is inadequate for assessing  PA pressure.   4. Left atrial size was mildly dilated.   5. The mitral valve is grossly normal. Trivial mitral valve  regurgitation.   6. The aortic valve is tricuspid. Aortic valve regurgitation is not  visualized.   7. The inferior vena cava is normal in size with greater than 50%  respiratory variability, suggesting right atrial pressure of 3 mmHg.   Comparison(s): Prior images unable to be directly viewed.  _______________   Cardiac MRI 03/30/2022: Impressions: 1. Moderate LVE with RWMA consistent with prior LAD infarct. Quantitative EF 35% 2. Non viable appearing distal anterior wall septum and apex with viable mid/basal anterior wall and LCX/RCA territory 3. 33.7% Gadolinium uptake in LAD territory infarct indicating large area of infarction 4.  Normal RV size and function RVEF 52% 5. Large mobile thrombus measuring 3 cm x 1.5 cm with broad based stalk attaching to apex  EKG:  EKG ordered today. EKG personally reviewed and demonstrates ***.  Recent Labs: No results found for requested labs within last 365 days.  Recent Lipid Panel  Component Value Date/Time   CHOL 177 03/29/2022 0151   TRIG 157 (H) 03/29/2022 0151   HDL 43 03/29/2022 0151   CHOLHDL 4.1 03/29/2022 0151   VLDL 31 03/29/2022 0151   LDLCALC 103 (H) 03/29/2022 0151    Physical Exam:    Vital Signs: There were no vitals taken for this visit.    Wt Readings from Last 3 Encounters:  04/20/22 176 lb 9.6 oz (80.1 kg)  04/01/22 181 lb 6.4 oz (82.3 kg)   07/13/17 170 lb (77.1 kg)     General: 33 y.o. male in no acute distress. HEENT: Normocephalic and atraumatic. Sclera clear.  Neck: Supple. No carotid bruits. No JVD. Heart: *** RRR. Distinct S1 and S2. No murmurs, gallops, or rubs.  Lungs: No increased work of breathing. Clear to ausculation bilaterally. No wheezes, rhonchi, or rales.  Abdomen: Soft, non-distended, and non-tender to palpation.  Extremities: No lower extremity edema.  Radial and distal pedal pulses 2+ and equal bilaterally. Skin: Warm and dry. Neuro: No focal deficits. Psych: Normal affect. Responds appropriately.   Assessment:    No diagnosis found.  Plan:     Disposition: Follow up in ***   Signed, Ricka Westra E Randol Zumstein, PA-C  05/13/2023 4:12 PM    [facility]

## 2023-05-19 ENCOUNTER — Other Ambulatory Visit: Payer: Self-pay

## 2023-05-25 ENCOUNTER — Ambulatory Visit: Payer: Self-pay | Attending: Student | Admitting: Student

## 2023-06-18 ENCOUNTER — Other Ambulatory Visit: Payer: Self-pay

## 2023-07-20 ENCOUNTER — Other Ambulatory Visit: Payer: Self-pay

## 2023-08-20 ENCOUNTER — Other Ambulatory Visit: Payer: Self-pay

## 2023-09-17 ENCOUNTER — Other Ambulatory Visit: Payer: Self-pay

## 2023-10-05 ENCOUNTER — Other Ambulatory Visit: Payer: Self-pay

## 2023-10-12 ENCOUNTER — Encounter (HOSPITAL_COMMUNITY): Payer: Self-pay

## 2023-10-12 ENCOUNTER — Other Ambulatory Visit: Payer: Self-pay

## 2023-10-12 ENCOUNTER — Emergency Department (HOSPITAL_COMMUNITY): Payer: Self-pay

## 2023-10-12 ENCOUNTER — Emergency Department (HOSPITAL_COMMUNITY)
Admission: EM | Admit: 2023-10-12 | Discharge: 2023-10-13 | Discharge status: Against Medical Advice | Payer: Self-pay | Attending: Emergency Medicine | Admitting: Emergency Medicine

## 2023-10-12 DIAGNOSIS — Z955 Presence of coronary angioplasty implant and graft: Secondary | ICD-10-CM | POA: Insufficient documentation

## 2023-10-12 DIAGNOSIS — Z5321 Procedure and treatment not carried out due to patient leaving prior to being seen by health care provider: Secondary | ICD-10-CM | POA: Insufficient documentation

## 2023-10-12 DIAGNOSIS — I509 Heart failure, unspecified: Secondary | ICD-10-CM | POA: Insufficient documentation

## 2023-10-12 DIAGNOSIS — R079 Chest pain, unspecified: Secondary | ICD-10-CM | POA: Insufficient documentation

## 2023-10-12 LAB — CBC
HCT: 52 % (ref 39.0–52.0)
Hemoglobin: 17.8 g/dL — ABNORMAL HIGH (ref 13.0–17.0)
MCH: 29.1 pg (ref 26.0–34.0)
MCHC: 34.2 g/dL (ref 30.0–36.0)
MCV: 85.1 fL (ref 80.0–100.0)
Platelets: 340 K/uL (ref 150–400)
RBC: 6.11 MIL/uL — ABNORMAL HIGH (ref 4.22–5.81)
RDW: 13.1 % (ref 11.5–15.5)
WBC: 10.6 K/uL — ABNORMAL HIGH (ref 4.0–10.5)
nRBC: 0 % (ref 0.0–0.2)

## 2023-10-12 LAB — BASIC METABOLIC PANEL WITH GFR
Anion gap: 12 (ref 5–15)
BUN: 14 mg/dL (ref 6–20)
CO2: 22 mmol/L (ref 22–32)
Calcium: 9.5 mg/dL (ref 8.9–10.3)
Chloride: 105 mmol/L (ref 98–111)
Creatinine, Ser: 1.09 mg/dL (ref 0.61–1.24)
GFR, Estimated: >60 mL/min (ref >60)
Glucose, Bld: 117 mg/dL — ABNORMAL HIGH (ref 70–99)
Potassium: 4.1 mmol/L (ref 3.5–5.1)
Sodium: 139 mmol/L (ref 135–145)

## 2023-10-12 LAB — TROPONIN I (HIGH SENSITIVITY): Troponin I (High Sensitivity): 4 ng/L (ref <18)

## 2023-10-12 MED ORDER — ASPIRIN 81 MG PO CHEW: 162.0000 mg | CHEWABLE_TABLET | Freq: Once | ORAL | Status: DC

## 2023-10-12 NOTE — ED Triage Notes (Signed)
 [facility] ws [facility] that he needed to come to the ED. They told him he has a right bundle branch block, he has CHF, and a stent. Patient reports chest pain that is radiating to his back.

## 2023-10-12 NOTE — ED Provider Triage Note (Addendum)
 Emergency Medicine Provider Triage Evaluation Note  Chad Fitzgerald , a 33 y.o. male hx of STEMI sp PCI, CHF, LV thrombus on xarelto  was evaluated in triage.  Pt complains of chest pain. Started Sunday of last week. 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 Quick sharp pains for a few seconds. Now is more random. UC EKG showed RBBB and was referred here. No cocaine use recently. Has been compliant with his meds. Took 324 mg ASA PTA. CP free currently.   Review of Systems  Positive: CP Negative: N/V, diaphoresis  Physical Exam  BP (!) 157/118 (BP Location: Left Arm)   Pulse 80   Temp 99.1 F (37.3 C)   Resp 15   SpO2 99%  Gen:   Awake, no distress   Resp:  Normal effort  MSK:   Moves extremities without difficulty  Other:    Medical Decision Making  Medically screening exam initiated at 9:11 PM.  Appropriate orders placed.  Chad Fitzgerald was informed that the remainder of the evaluation will be completed by another provider, this initial triage assessment does not replace that evaluation, and the importance of remaining in the ED until their evaluation is complete.   Chad Lamar BROCKS, MD 10/12/23 2116

## 2023-10-13 LAB — TROPONIN I (HIGH SENSITIVITY): Troponin I (High Sensitivity): 4 ng/L (ref <18)

## 2023-10-13 NOTE — ED Notes (Signed)
 Pt is leaving hospital states he feels better and has a appointment with his cardiologist the 17th

## 2023-10-21 DIAGNOSIS — E785 Hyperlipidemia, unspecified: Secondary | ICD-10-CM | POA: Insufficient documentation

## 2023-10-21 NOTE — Progress Notes (Signed)
 Cardiology Office Note:   Date:  10/22/2023  ID:  Chad Fitzgerald, DOB 07/26/1990, MRN [id number] PCP: Patient, No Pcp Per  [facility] Providers Cardiologist:  Lynwood Schilling, MD {  History of Present Illness:   Chad Fitzgerald is a 33 y.o. male with a history of CAD s/p DES to LAD 04/2014,   He has a history of an anterolateral ST elevation MI in April 2016. He had a drug-eluting stent placed to his LAD. Stress test was intermediate risk. Follow-up cath showed patent stent.   His most recent echo was March 2024.  This demonstrated an ovoid apical LV mural thrombus.  The EF was 45 to 50%.  He was last seen in the clinic in Advanced Heart Failure in April 2024.  He had been in the hospital with a PE in March 2024.  He was managed medically with no ARNI or ACE or ARB secondary to angioedema with Entresto .  He was in the emergency room not long ago with chest pain.  He said this was sharp discomfort.  It was left upper chest lasting for 2 to 3 seconds.  He had a very strenuous and stressful job.  He said actually since stopping that and switching to another job he has done well.  I did review the emergency room records.  He did not stay but did get an EKG and troponins.  The troponins were negative.  The EKG was nonacute.  Since that time he has been back on the treadmill and doing well and denies any cardiovascular symptoms.  He is not having any new shortness of breath, PND or orthopnea.  He is not having any new palpitations, presyncope or syncope.  He had no weight gain or edema.   ROS: As stated in the HPI and negative for all other systems.  Studies Reviewed:    EKG:       Sinus rhythm, rate 74, axis within normal limits, intervals within normal limits, no acute ST-T wave changes, old anteroseptal infarct, October 12, 2023   Risk Assessment/Calculations:              Physical Exam:   VS:  BP 122/75 (BP Location: Left Arm, Patient Position: Sitting, Cuff Size: Normal)    Pulse 75   Ht 5' 7 (1.702 m)   Wt 176 lb 3.2 oz (79.9 kg)   SpO2 96%   BMI 27.60 kg/m    Wt Readings from Last 3 Encounters:  10/22/23 176 lb 3.2 oz (79.9 kg)  04/20/22 176 lb 9.6 oz (80.1 kg)  04/01/22 181 lb 6.4 oz (82.3 kg)     GEN: Well nourished, well developed in no acute distress NECK: No JVD; No carotid bruits CARDIAC: RRR, no murmurs, rubs, gallops RESPIRATORY:  Clear to auscultation without rales, wheezing or rhonchi  ABDOMEN: Soft, non-tender, non-distended EXTREMITIES:  No edema; No deformity   ASSESSMENT AND PLAN:   CAD s/p DES to LAD 04/2014:    The patient has no new anginal symptoms.  The pain that he has sounds nonanginal and atypical.  He needs to continue with risk reduction.  ICM: I am going to repeat an echocardiogram.  I am going to look for LV thrombus as he is at evidence of this.  He remains on Xarelto .  LV thrombus: As above.  Recent evidence would suggest that he can be managed for both his LV thrombus as well as coronary artery stents with DOAC alone.  I will stop the aspirin .  HLD: He will get a fasting lipid profile and LP(a).   Follow up with APP in six months.   Signed, Lynwood Schilling, MD

## 2023-10-22 ENCOUNTER — Ambulatory Visit: Payer: Self-pay | Attending: Cardiology | Admitting: Cardiology

## 2023-10-22 ENCOUNTER — Encounter: Payer: Self-pay | Admitting: Cardiology

## 2023-10-22 ENCOUNTER — Ambulatory Visit: Payer: Self-pay | Admitting: Cardiology

## 2023-10-22 VITALS — BP 122/75 | HR 75 | Ht 67.0 in | Wt 176.2 lb

## 2023-10-22 DIAGNOSIS — Z9861 Coronary angioplasty status: Secondary | ICD-10-CM

## 2023-10-22 DIAGNOSIS — E785 Hyperlipidemia, unspecified: Secondary | ICD-10-CM

## 2023-10-22 DIAGNOSIS — I255 Ischemic cardiomyopathy: Secondary | ICD-10-CM

## 2023-10-22 DIAGNOSIS — I2510 Atherosclerotic heart disease of native coronary artery without angina pectoris: Secondary | ICD-10-CM

## 2023-10-22 NOTE — Patient Instructions (Addendum)
 Medication Instructions:  Stop Aspirin  *If you need a refill on your cardiac medications before your next appointment, please call your pharmacy*  Lab Work: Lp(a) and Fasting lipid panel If you have labs (blood work) drawn today and your tests are completely normal, you will receive your results only by: MyChart Message (if you have MyChart) OR A paper copy in the mail If you have any lab test that is abnormal or we need to change your treatment, we will call you to review the results.  Testing/Procedures: Echocardiogram Your physician has requested that you have an echocardiogram. Echocardiography is a painless test that uses sound waves to create images of your heart. It provides your doctor with information about the size and shape of your heart and how well your heart's chambers and valves are working. This procedure takes approximately one hour. There are no restrictions for this procedure. Please do NOT wear cologne, perfume, aftershave, or lotions (deodorant is allowed). Please arrive 15 minutes prior to your appointment time.  Please note: We ask at that you not bring children with you during ultrasound (echo/ vascular) testing. Due to room size and safety concerns, children are not allowed in the ultrasound rooms during exams. Our front office staff cannot provide observation of children in our lobby area while testing is being conducted. An adult accompanying a patient to their appointment will only be allowed in the ultrasound room at the discretion of the ultrasound technician under special circumstances. We apologize for any inconvenience.   Follow-Up: At [facility], you and your health needs are our priority.  As part of our continuing mission to provide you with exceptional heart care, our providers are all part of one team.  This team includes your primary Cardiologist (physician) and Advanced Practice Providers or APPs (Physician Assistants and Nurse Practitioners)  who all work together to provide you with the care you need, when you need it.  Your next appointment:   6 month(s)  Provider:   One of our Advanced Practice Providers (APPs): Morse Clause, PA-C  Lamarr Satterfield, NP Miriam Shams, NP  Olivia Pavy, PA-C Josefa Beauvais, NP  Leontine Salen, PA-C Orren Fabry, PA-C  Hao Meng, PA-C Ernest Dick, NP  Damien Braver, NP Jon Hails, PA-C  Waddell Donath, PA-C    Dayna Dunn, PA-C  Scott Weaver, PA-C Lum Louis, NP Katlyn West, NP Callie Goodrich, PA-C  Xika Zhao, NP Sheng Haley, PA-C    Kathleen Johnson, PA-C   We recommend signing up for the patient portal called MyChart.  Sign up information is provided on this After Visit Summary.  MyChart is used to connect with patients for Virtual Visits (Telemedicine).  Patients are able to view lab/test results, encounter notes, upcoming appointments, etc.  Non-urgent messages can be sent to your provider as well.   To learn more about what you can do with MyChart, go to [email or web address].

## 2023-11-05 ENCOUNTER — Telehealth: Payer: Self-pay | Admitting: Licensed Clinical Social Worker

## 2023-11-05 NOTE — Telephone Encounter (Signed)
 H&V Care Navigation CSW Progress Note  Clinical Social Worker contacted patient by phone to f/u on self pay status. Previously had shared he was enrolled in [facility] insurance but no longer see that on file. Happy to assist as needed. Left voicemail at [phone]. Will re-attempt again as able.  Patient is participating in a Managed Medicaid Plan:  No, self pay only  SDOH Screenings   Food Insecurity: No Food Insecurity (04/13/2023)  Housing: Low Risk  (04/13/2023)  Transportation Needs: No Transportation Needs (04/13/2023)  Utilities: Not At Risk (04/13/2023)  Alcohol Screen: Low Risk  (03/31/2022)  Financial Resource Strain: Medium Risk (04/13/2023)  Tobacco Use: Medium Risk (10/24/2023)   Received from [facility]  Health Literacy: Adequate Health Literacy (04/13/2023)   Marit Lark, MSW, LCSW Clinical Social Worker II [facility] Health Heart/Vascular Care Navigation  [phone]- work cell phone (preferred)

## 2023-11-08 ENCOUNTER — Telehealth: Payer: Self-pay | Admitting: Licensed Clinical Social Worker

## 2023-11-08 NOTE — Telephone Encounter (Signed)
 H&V Care Navigation CSW Progress Note  Clinical Social Worker contacted patient by phone to f/u on self pay status again. Previously had shared he was enrolled in [employer] [facility] [employer] insurance but no longer see that on file. Happy to assist as needed. Left 2nd voicemail at [phone]. Will re-attempt again as able.   Patient is participating in a Managed Medicaid Plan:  No, Self Pay only  SDOH Screenings   Food Insecurity: No Food Insecurity (04/13/2023)  Housing: Low Risk  (04/13/2023)  Transportation Needs: No Transportation Needs (04/13/2023)  Utilities: Not At Risk (04/13/2023)  Alcohol Screen: Low Risk  (03/31/2022)  Financial Resource Strain: Medium Risk (04/13/2023)  Tobacco Use: Medium Risk (10/24/2023)   Received from [facility]  Health Literacy: Adequate Health Literacy (04/13/2023)    Marit Lark, MSW, LCSW Clinical Social Worker II [facility] Health Heart/Vascular Care Navigation  [phone]- work cell phone (preferred)

## 2023-11-11 ENCOUNTER — Encounter (HOSPITAL_BASED_OUTPATIENT_CLINIC_OR_DEPARTMENT_OTHER): Payer: Self-pay

## 2023-11-13 ENCOUNTER — Other Ambulatory Visit: Payer: Self-pay

## 2023-11-15 ENCOUNTER — Other Ambulatory Visit: Payer: Self-pay

## 2023-11-18 ENCOUNTER — Telehealth: Payer: Self-pay

## 2023-11-18 NOTE — Telephone Encounter (Signed)
 Patient dropped off paperwork for Med assistance.  11-18-23 VB  Placed envelope in Chris's mailbox on [address]

## 2023-11-18 NOTE — Telephone Encounter (Addendum)
 Pharmacy PA/PAP team will scan paperwork to fax once they return onsite on 11/19/23.

## 2023-11-22 ENCOUNTER — Telehealth: Payer: Self-pay | Admitting: Pharmacy Technician

## 2023-11-22 MED ORDER — APIXABAN 5 MG PO TABS: 5.0000 mg | ORAL_TABLET | Freq: Two times a day (BID) | ORAL | 0 refills | Status: AC

## 2023-11-22 NOTE — Addendum Note (Signed)
 Addended by: Selenia Mihok D on: 11/22/2023 11:05 AM   Modules accepted: Orders

## 2023-11-22 NOTE — Telephone Encounter (Signed)
 Paperwork scanned to med assist fax.

## 2023-11-22 NOTE — Telephone Encounter (Signed)
 Hi, can someone please get the provider to sign the form that is scanned in media under XARELTO  [employer] for this patient and fax back to [phone]? Thank you!

## 2023-11-23 ENCOUNTER — Other Ambulatory Visit: Payer: Self-pay

## 2023-11-23 NOTE — Telephone Encounter (Signed)
 Hello, I am working on this pap in another encounter. April Procita RPH, reached out and asked if there were samples for this patient for the xarelto  while we wait on the application.

## 2023-11-24 ENCOUNTER — Other Ambulatory Visit: Payer: Self-pay

## 2023-11-24 NOTE — Telephone Encounter (Signed)
 Hi, I put the application in Dr. Denver mailbox to be signed and put in the patient advocate mailbox. Thank you!

## 2023-11-24 NOTE — Telephone Encounter (Signed)
 Pt calling to f/u on paperwork. Please advise.   Hi, can someone please get the provider to sign the form that is scanned in media under XARELTO  [employer] ASSISTANCE for this patient and fax back to [phone]

## 2023-11-25 ENCOUNTER — Other Ambulatory Visit: Payer: Self-pay

## 2023-11-25 NOTE — Telephone Encounter (Signed)
 H&V Care Navigation CSW Progress Note  Clinical Social Worker contacted patient by phone to f/u on samples. These had been left by Pharmacy team on 11/17 and pt acknowledged they were ready and he would pick them up.   No answer, left voicemail. Also sent Mychart telling him they are ready and app has been faxed by Pharmacy assistance team.  Patient is participating in a Managed Medicaid Plan:  No, self pay only  SDOH Screenings   Food Insecurity: No Food Insecurity (04/13/2023)  Housing: Low Risk  (04/13/2023)  Transportation Needs: No Transportation Needs (04/13/2023)  Utilities: Not At Risk (04/13/2023)  Alcohol Screen: Low Risk  (03/31/2022)  Financial Resource Strain: Medium Risk (04/13/2023)  Tobacco Use: Medium Risk (10/24/2023)   Received from [facility]  Health Literacy: Adequate Health Literacy (04/13/2023)    Marit Lark, MSW, LCSW Clinical Social Worker II [facility] Health Heart/Vascular Care Navigation  [phone]- work cell phone (preferred)

## 2023-11-25 NOTE — Telephone Encounter (Signed)
 H&V Care Navigation CSW Progress Note  Clinical Social Worker received a call from pt to let me know he did pick up samples. Didn't realize it was a alternate for Xarelto . Directed him to review Melissa, RPH's note and directions with bottles of Eliquis . He is concerned about side effects but will take his first dose today and let nursing teams know if any issues. Let him know application was faxed, sounds like he also may have some benefit through new employer that he will bring by the pharmacy to see if it may work to fill meds- he not able to give me any more information about this coverage at this time.  Patient is participating in a Managed Medicaid Plan:  No, self pay only  SDOH Screenings   Food Insecurity: No Food Insecurity (04/13/2023)  Housing: Low Risk  (04/13/2023)  Transportation Needs: No Transportation Needs (04/13/2023)  Utilities: Not At Risk (04/13/2023)  Alcohol Screen: Low Risk  (03/31/2022)  Financial Resource Strain: Medium Risk (04/13/2023)  Tobacco Use: Medium Risk (10/24/2023)   Received from [facility]  Health Literacy: Adequate Health Literacy (04/13/2023)    Marit Lark, MSW, LCSW Clinical Social Worker II [facility] Health Heart/Vascular Care Navigation  [phone]- work cell phone (preferred)

## 2023-11-25 NOTE — Telephone Encounter (Signed)
 Faxed application to [employer]

## 2023-11-25 NOTE — Telephone Encounter (Signed)
 Pt calling in about samples, please advise. He has been out for two days

## 2023-11-25 NOTE — Telephone Encounter (Signed)
 Form has been signed and faxed back to patient advocate team. Original copy placed in patient assistance mailbox.

## 2023-11-25 NOTE — Telephone Encounter (Signed)
 Pt called in asking about meds, he has been without it for two days. I did tell him that it was forwarded to the nurse working with him today so Dr. Lavona can sign. There is another encounter asking for samples until pt can get approval for this.

## 2023-11-26 ENCOUNTER — Other Ambulatory Visit: Payer: Self-pay

## 2023-11-26 ENCOUNTER — Encounter (HOSPITAL_COMMUNITY): Payer: Self-pay

## 2023-11-26 ENCOUNTER — Ambulatory Visit (HOSPITAL_COMMUNITY): Admission: RE | Admit: 2023-11-26 | Payer: Self-pay | Source: Ambulatory Visit

## 2023-11-26 NOTE — Telephone Encounter (Signed)
 PAP: Patient assistance application for Xarelto  has been approved by PAP Companies: [employer] from 11/25/23 to 11/24/24. Medication should be delivered to PAP Delivery: Home. For further shipping updates, please contact Vicci ODESSIA Vicci ([employer]) at [phone]. Patient ID is: [id number]

## 2023-11-26 NOTE — Telephone Encounter (Signed)
 I called the patient and left a message to let him know we just faxed the application to [facility] and Chad Fitzgerald yesterday and once we hear back we will let him know

## 2023-11-26 NOTE — Telephone Encounter (Signed)
Patient is calling for update. Please advise

## 2023-12-08 ENCOUNTER — Telehealth (HOSPITAL_COMMUNITY): Payer: Self-pay | Admitting: Cardiology

## 2023-12-08 NOTE — Telephone Encounter (Signed)
 Patient NO SHOWED Echocardiogram..We will not reach out to reschedule patient due to NO SHOW RATE of 28%. If patient calls back to reschedule we will make appointment. Order will be removed form the echo WQ.

## 2023-12-21 ENCOUNTER — Encounter (HOSPITAL_COMMUNITY): Payer: Self-pay

## 2023-12-21 ENCOUNTER — Other Ambulatory Visit: Payer: Self-pay

## 2023-12-21 ENCOUNTER — Emergency Department (HOSPITAL_COMMUNITY)
Admission: EM | Admit: 2023-12-21 | Discharge: 2023-12-21 | Disposition: A | Discharge status: Home / Self Care | Attending: Emergency Medicine | Admitting: Emergency Medicine

## 2023-12-21 DIAGNOSIS — J101 Influenza due to other identified influenza virus with other respiratory manifestations: Secondary | ICD-10-CM | POA: Diagnosis not present

## 2023-12-21 DIAGNOSIS — Z7901 Long term (current) use of anticoagulants: Secondary | ICD-10-CM | POA: Diagnosis not present

## 2023-12-21 DIAGNOSIS — R509 Fever, unspecified: Secondary | ICD-10-CM | POA: Diagnosis not present

## 2023-12-21 DIAGNOSIS — R519 Headache, unspecified: Secondary | ICD-10-CM | POA: Diagnosis present

## 2023-12-21 LAB — RESP PANEL BY RT-PCR (RSV, FLU A&B, COVID)  RVPGX2
Influenza A by PCR: POSITIVE — AB
Influenza B by PCR: NEGATIVE
Resp Syncytial Virus by PCR: NEGATIVE
SARS Coronavirus 2 by RT PCR: NEGATIVE

## 2023-12-21 MED ORDER — BENZONATATE 100 MG PO CAPS: 100.0000 mg | ORAL_CAPSULE | Freq: Three times a day (TID) | ORAL | 0 refills | Status: AC

## 2023-12-21 MED ORDER — ACETAMINOPHEN 325 MG PO TABS: 650.0000 mg | ORAL_TABLET | Freq: Once | ORAL | Status: DC | PRN

## 2023-12-21 MED ORDER — IBUPROFEN 800 MG PO TABS
800.0000 mg | ORAL_TABLET | Freq: Once | ORAL | Status: AC
  Administered 2023-12-21: 02:00:00 800 mg via ORAL
  Filled 2023-12-21: qty 1

## 2023-12-21 MED ORDER — ONDANSETRON 4 MG PO TBDP: 4.0000 mg | ORAL_TABLET | Freq: Three times a day (TID) | ORAL | 0 refills | Status: AC | PRN

## 2023-12-21 MED ORDER — OSELTAMIVIR PHOSPHATE 75 MG PO CAPS: 75.0000 mg | ORAL_CAPSULE | Freq: Two times a day (BID) | ORAL | 0 refills | Status: AC

## 2023-12-21 NOTE — ED Provider Notes (Signed)
 [place] [facility] DEPARTMENT AT [facility] Provider Note   CSN: [id number] Arrival date & time: 12/21/23  9864     Patient presents with: Headache and Fatigue   Chad Fitzgerald is a 33 y.o. male.   The history is provided by the patient and medical records.  Headache Associated symptoms: fatigue and myalgias    33 year old male presenting to the ED with flulike symptoms.  Notably he has had headache, fatigue, body aches, nausea, and has generally been feeling unwell.  This started acutely at lunchtime today.  He denies any sick contacts but did attend a very large family wedding over the weekend, unsure of any sick contacts there.  Unknown fever at home but is febrile to 101.11F on arrival to ED.  He did take tylenol  and aspirin  PTA.  Prior to Admission medications  Medication Sig Start Date End Date Taking? Authorizing Provider  benzonatate  (TESSALON ) 100 MG capsule Take 1 capsule (100 mg total) by mouth every 8 (eight) hours. 12/21/23  Yes Jarold Olam HERO, PA-C  ondansetron  (ZOFRAN -ODT) 4 MG disintegrating tablet Take 1 tablet (4 mg total) by mouth every 8 (eight) hours as needed. 12/21/23  Yes Jarold Olam HERO, PA-C  oseltamivir  (TAMIFLU ) 75 MG capsule Take 1 capsule (75 mg total) by mouth every 12 (twelve) hours. 12/21/23  Yes Jarold Olam HERO, PA-C  amLODipine  (NORVASC ) 2.5 MG tablet Take 1 tablet (2.5 mg total) by mouth daily. 04/20/22   Clegg, Amy D, NP  apixaban  (ELIQUIS ) 5 MG TABS tablet Take 1 tablet (5 mg total) by mouth 2 (two) times daily. 11/22/23   Lynwood Schilling, MD  atorvastatin  (LIPITOR ) 80 MG tablet Take 1 tablet (80 mg total) by mouth daily. 04/02/22   Dunn, Dayna N, PA-C  metoprolol  succinate (TOPROL -XL) 25 MG 24 hr tablet Take 1 tablet (25 mg total) by mouth at bedtime. Patient not taking: Reported on 10/22/2023 04/20/22   Lenetta No D, NP  nitroGLYCERIN  (NITROSTAT ) 0.4 MG SL tablet Place 1 tablet (0.4 mg total) under the tongue every 5 (five) minutes  as needed for chest pain (up to 3 doses. If taking 3rd dose call 911). 04/01/22   Dunn, Dayna N, PA-C  rivaroxaban  (XARELTO ) 20 MG TABS tablet Take 1 tablet (20 mg total) by mouth daily with supper. 04/12/23   Lynwood Schilling, MD  spironolactone  (ALDACTONE ) 25 MG tablet Take 1 tablet (25 mg total) by mouth daily. 04/02/22   Dunn, Dayna N, PA-C    Allergies: Bee pollen and Entresto  [sacubitril -valsartan ]    Review of Systems  Constitutional:  Positive for fatigue.  Musculoskeletal:  Positive for myalgias.  Neurological:  Positive for headaches.  All other systems reviewed and are negative.   Updated Vital Signs BP 134/73 (BP Location: Right Arm)   Pulse 100   Temp 99 F (37.2 C) (Oral)   Resp 17   Ht 5' 7 (1.702 m)   Wt 79.4 kg   SpO2 100%   BMI 27.41 kg/m   Physical Exam Vitals and nursing note reviewed.  Constitutional:      General: He is not in acute distress.    Appearance: He is well-developed. He is not diaphoretic.  HENT:     Head: Normocephalic and atraumatic.     Right Ear: External ear normal.     Left Ear: External ear normal.  Eyes:     Conjunctiva/sclera: Conjunctivae normal.     Pupils: Pupils are equal, round, and reactive to light.  Neck:  Comments: No rigidity, no meningismus Cardiovascular:     Rate and Rhythm: Normal rate and regular rhythm.     Heart sounds: Normal heart sounds. No murmur heard. Pulmonary:     Effort: Pulmonary effort is normal. No respiratory distress.     Breath sounds: Normal breath sounds. No wheezing or rhonchi.  Abdominal:     General: Bowel sounds are normal.     Palpations: Abdomen is soft.     Tenderness: There is no abdominal tenderness. There is no guarding.  Musculoskeletal:        General: Normal range of motion.     Cervical back: Full passive range of motion without pain, normal range of motion and neck supple. No rigidity.  Skin:    General: Skin is warm and dry.     Findings: No rash.  Neurological:      Mental Status: He is alert and oriented to person, place, and time.     Cranial Nerves: No cranial nerve deficit.     Sensory: No sensory deficit.     Motor: No tremor or seizure activity.     Comments: AAOx3, answering questions and following commands appropriately; equal strength UE and LE bilaterally; CN grossly intact; moves all extremities appropriately without ataxia; no focal neuro deficits or facial asymmetry appreciated  Psychiatric:        Behavior: Behavior normal.        Thought Content: Thought content normal.     (all labs ordered are listed, but only abnormal results are displayed) Labs Reviewed  RESP PANEL BY RT-PCR (RSV, FLU A&B, COVID)  [id number] - Abnormal; Notable for the following components:      Result Value   Influenza A by PCR POSITIVE (*)    All other components within normal limits    EKG: None  Radiology: No results found.   Procedures   Medications Ordered in the ED  acetaminophen  (TYLENOL ) tablet 650 mg (has no administration in time range)  ibuprofen  (ADVIL ) tablet 800 mg (800 mg Oral Given 12/21/23 0211)                                    Medical Decision Making Amount and/or Complexity of Data Reviewed ECG/medicine tests: ordered and independent interpretation performed.  Risk OTC drugs. Prescription drug management.   33 year old male here with flulike symptoms.  No known sick contacts.  Febrile on arrival but very nontoxic in appearance.  Has headache but no focal neurologic deficit, no meningeal signs.  Lungs are clear without any wheezes or rhonchi.  Clinically he looks to have influenza.  RVP confirms influenza A.  Given Motrin  for fever here.  As symptoms did begin within the past 24 hours, he is in the window to initiate Tamiflu  and he is agreeable.  Plan for continue symptomatic care.  Close PCP follow-up.  Work note given.  Can return here for new concerns.  Final diagnoses:  Influenza A    ED Discharge Orders           Ordered    oseltamivir  (TAMIFLU ) 75 MG capsule  Every 12 hours        12/21/23 0350    benzonatate  (TESSALON ) 100 MG capsule  Every 8 hours        12/21/23 0350    ondansetron  (ZOFRAN -ODT) 4 MG disintegrating tablet  Every 8 hours PRN        12/21/23  0350               Jarold Olam HERO, PA-C 12/21/23 ZOILA    Griselda Norris, MD 12/21/23 0700

## 2023-12-21 NOTE — ED Notes (Signed)
 Pt reports taking tyulonol 500mg  x3 at 11pm on 12/15 Pt reports taking asprin 325mg  at 0100 on 12/16

## 2023-12-21 NOTE — ED Triage Notes (Signed)
 Pt coming in from [facility] with headache malase loss of apitite generalized weakness and nausea starting at lunch time. 18 l fa. fluid and 4mg  zofran   Hr 120 Bp 159/88 96% ra  Cbg 140

## 2023-12-21 NOTE — ED Provider Triage Note (Signed)
 Emergency Medicine Provider Triage Evaluation Note  Chad Fitzgerald , a 33 y.o. male  was evaluated in triage.  Pt complains of headache, fatigue, body aches, and generally feeling unwell since lunch time today. Denies sick contacts but did attend a large wedding over the weekend.  Took tylenol  and aspirin  just PTA.  Febrile to 101.84F on arrival  Review of Systems  Positive: Flu like sx, fever Negative: Chest pain  Physical Exam  BP (!) 155/92 (BP Location: Right Arm)   Pulse (!) 120   Temp (!) 101.9 F (38.8 C) (Oral)   Resp 20   Ht 5' 7 (1.702 m)   Wt 79.4 kg   SpO2 96%   BMI 27.41 kg/m  Gen:   Awake, no distress   Resp:  Normal effort  MSK:   Moves extremities without difficulty  Other:    Medical Decision Making  Medically screening exam initiated at 2:00 AM.  Appropriate orders placed.  Chad Fitzgerald was informed that the remainder of the evaluation will be completed by another provider, this initial triage assessment does not replace that evaluation, and the importance of remaining in the ED until their evaluation is complete.  Flu like sx beginning today.  Febrile here but non-toxic.  Motrin  given for fever, RVP sent.   Jarold Olam HERO, PA-C 12/21/23 0207

## 2023-12-21 NOTE — Discharge Instructions (Signed)
 You have tested for flu A today.  We will initiate Tamiflu , however this is not a cure. Take the prescribed medication as directed.  Continue Tylenol  or Motrin  as needed for fever.  Make sure to rest and hydrate well. Follow-up with your primary care doctor. Return to the ED for new or worsening symptoms.

## 2024-01-14 ENCOUNTER — Telehealth: Payer: Self-pay | Admitting: Pharmacy Technician

## 2024-01-14 NOTE — Telephone Encounter (Signed)
" ° ° °  Xarelto  j&j approval 2026 "
